# Patient Record
Sex: Male | Born: 1940 | Race: White | Hispanic: No | Marital: Single | Smoking: Former smoker
Health system: Southern US, Community
[De-identification: ages and names within clinical notes are randomized; demographics above are authoritative.]

## PROBLEM LIST (undated history)

## (undated) DIAGNOSIS — E785 Hyperlipidemia, unspecified: Secondary | ICD-10-CM

## (undated) DIAGNOSIS — M109 Gout, unspecified: Secondary | ICD-10-CM

## (undated) DIAGNOSIS — J45909 Unspecified asthma, uncomplicated: Secondary | ICD-10-CM

## (undated) DIAGNOSIS — I10 Essential (primary) hypertension: Secondary | ICD-10-CM

## (undated) DIAGNOSIS — F5104 Psychophysiologic insomnia: Secondary | ICD-10-CM

## (undated) DIAGNOSIS — T7840XA Allergy, unspecified, initial encounter: Secondary | ICD-10-CM

## (undated) DIAGNOSIS — N400 Enlarged prostate without lower urinary tract symptoms: Secondary | ICD-10-CM

## (undated) DIAGNOSIS — M1990 Unspecified osteoarthritis, unspecified site: Secondary | ICD-10-CM

## (undated) DIAGNOSIS — I4891 Unspecified atrial fibrillation: Secondary | ICD-10-CM

## (undated) DIAGNOSIS — E669 Obesity, unspecified: Secondary | ICD-10-CM

## (undated) HISTORY — DX: Unspecified asthma, uncomplicated: J45.909

## (undated) HISTORY — DX: Psychophysiologic insomnia: F51.04

## (undated) HISTORY — DX: Allergy, unspecified, initial encounter: T78.40XA

## (undated) HISTORY — DX: Obesity, unspecified: E66.9

## (undated) HISTORY — DX: Gout, unspecified: M10.9

## (undated) HISTORY — DX: Unspecified atrial fibrillation: I48.91

## (undated) HISTORY — DX: Essential (primary) hypertension: I10

## (undated) HISTORY — DX: Unspecified osteoarthritis, unspecified site: M19.90

## (undated) HISTORY — DX: Hyperlipidemia, unspecified: E78.5

## (undated) HISTORY — DX: Benign prostatic hyperplasia without lower urinary tract symptoms: N40.0

---

## 1972-07-22 DIAGNOSIS — E669 Obesity, unspecified: Secondary | ICD-10-CM

## 1972-07-22 HISTORY — DX: Obesity, unspecified: E66.9

## 1976-07-22 DIAGNOSIS — T7840XA Allergy, unspecified, initial encounter: Secondary | ICD-10-CM

## 1976-07-22 HISTORY — DX: Allergy, unspecified, initial encounter: T78.40XA

## 2013-05-24 ENCOUNTER — Ambulatory Visit: Payer: Self-pay | Admitting: Physician Assistant

## 2013-07-09 ENCOUNTER — Ambulatory Visit: Payer: Self-pay | Admitting: Family Medicine

## 2013-08-02 ENCOUNTER — Ambulatory Visit: Payer: Self-pay | Admitting: Family Medicine

## 2013-09-27 ENCOUNTER — Encounter: Payer: Self-pay | Admitting: Family Medicine

## 2013-09-27 ENCOUNTER — Ambulatory Visit (INDEPENDENT_AMBULATORY_CARE_PROVIDER_SITE_OTHER): Payer: Commercial Managed Care - HMO | Admitting: Family Medicine

## 2013-09-27 ENCOUNTER — Other Ambulatory Visit: Payer: Self-pay | Admitting: Family Medicine

## 2013-09-27 VITALS — BP 144/72 | HR 88 | Temp 98.7°F | Resp 22 | Ht 72.5 in | Wt >= 6400 oz

## 2013-09-27 DIAGNOSIS — L0882 Omphalitis not of newborn: Secondary | ICD-10-CM | POA: Insufficient documentation

## 2013-09-27 DIAGNOSIS — I872 Venous insufficiency (chronic) (peripheral): Secondary | ICD-10-CM

## 2013-09-27 DIAGNOSIS — Z23 Encounter for immunization: Secondary | ICD-10-CM

## 2013-09-27 DIAGNOSIS — N400 Enlarged prostate without lower urinary tract symptoms: Secondary | ICD-10-CM

## 2013-09-27 DIAGNOSIS — G4700 Insomnia, unspecified: Secondary | ICD-10-CM

## 2013-09-27 DIAGNOSIS — F5104 Psychophysiologic insomnia: Secondary | ICD-10-CM

## 2013-09-27 DIAGNOSIS — I10 Essential (primary) hypertension: Secondary | ICD-10-CM

## 2013-09-27 DIAGNOSIS — I4891 Unspecified atrial fibrillation: Secondary | ICD-10-CM

## 2013-09-27 DIAGNOSIS — E785 Hyperlipidemia, unspecified: Secondary | ICD-10-CM

## 2013-09-27 DIAGNOSIS — J45909 Unspecified asthma, uncomplicated: Secondary | ICD-10-CM | POA: Insufficient documentation

## 2013-09-27 DIAGNOSIS — I8310 Varicose veins of unspecified lower extremity with inflammation: Secondary | ICD-10-CM

## 2013-09-27 DIAGNOSIS — L089 Local infection of the skin and subcutaneous tissue, unspecified: Secondary | ICD-10-CM

## 2013-09-27 DIAGNOSIS — Z9181 History of falling: Secondary | ICD-10-CM

## 2013-09-27 LAB — PT WITH INR/FINGERSTICK
INR, fingerstick: 2.8 — ABNORMAL HIGH (ref 0.80–1.20)
PT, fingerstick: 33.2 seconds — ABNORMAL HIGH (ref 10.4–12.5)

## 2013-09-27 MED ORDER — SULFAMETHOXAZOLE-TMP DS 800-160 MG PO TABS: 1.0000 | ORAL_TABLET | Freq: Two times a day (BID) | ORAL | Status: DC

## 2013-09-27 NOTE — Patient Instructions (Addendum)
STOP Aspirin, ibuprofen Okay to take acetaminophen 325-500mg  twice a day for arthritis Start antibiotics for the infected belly button [facility]   Release of records - [facility] Rox[facility] Hockeyobinson[facility] [place] F/U 4 weeks

## 2013-09-28 ENCOUNTER — Encounter: Payer: Self-pay | Admitting: Family Medicine

## 2013-09-28 DIAGNOSIS — F5104 Psychophysiologic insomnia: Secondary | ICD-10-CM | POA: Insufficient documentation

## 2013-09-28 DIAGNOSIS — Z9181 History of falling: Secondary | ICD-10-CM | POA: Insufficient documentation

## 2013-09-28 LAB — CBC WITH DIFFERENTIAL/PLATELET
BASOS PCT: 0 % (ref 0–1)
Basophils Absolute: 0 10*3/uL (ref 0.0–0.1)
EOS ABS: 0.1 10*3/uL (ref 0.0–0.7)
Eosinophils Relative: 1 % (ref 0–5)
HEMATOCRIT: 43.4 % (ref 39.0–52.0)
Hemoglobin: 14.4 g/dL (ref 13.0–17.0)
LYMPHS ABS: 2.4 10*3/uL (ref 0.7–4.0)
Lymphocytes Relative: 18 % (ref 12–46)
MCH: 30.5 pg (ref 26.0–34.0)
MCHC: 33.2 g/dL (ref 30.0–36.0)
MCV: 91.9 fL (ref 78.0–100.0)
Monocytes Absolute: 0.8 10*3/uL (ref 0.1–1.0)
Monocytes Relative: 6 % (ref 3–12)
Neutro Abs: 9.8 10*3/uL — ABNORMAL HIGH (ref 1.7–7.7)
Neutrophils Relative %: 75 % (ref 43–77)
PLATELETS: 229 10*3/uL (ref 150–400)
RBC: 4.72 MIL/uL (ref 4.22–5.81)
RDW: 14.6 % (ref 11.5–15.5)
WBC: 13.1 10*3/uL — AB (ref 4.0–10.5)

## 2013-09-28 LAB — LIPID PANEL
Cholesterol: 163 mg/dL (ref 0–200)
HDL: 45 mg/dL (ref >39)
LDL CALC: 85 mg/dL (ref 0–99)
Total CHOL/HDL Ratio: 3.6 Ratio
Triglycerides: 165 mg/dL — ABNORMAL HIGH (ref <150)
VLDL: 33 mg/dL (ref 0–40)

## 2013-09-28 LAB — COMPREHENSIVE METABOLIC PANEL
ALK PHOS: 70 U/L (ref 39–117)
ALT: 47 U/L (ref 0–53)
AST: 48 U/L — AB (ref 0–37)
Albumin: 4.6 g/dL (ref 3.5–5.2)
BILIRUBIN TOTAL: 0.5 mg/dL (ref 0.2–1.2)
BUN: 29 mg/dL — ABNORMAL HIGH (ref 6–23)
CO2: 24 mEq/L (ref 19–32)
Calcium: 9.8 mg/dL (ref 8.4–10.5)
Chloride: 97 mEq/L (ref 96–112)
Creat: 1.49 mg/dL — ABNORMAL HIGH (ref 0.50–1.35)
Glucose, Bld: 123 mg/dL — ABNORMAL HIGH (ref 70–99)
Potassium: 5 mEq/L (ref 3.5–5.3)
SODIUM: 136 meq/L (ref 135–145)
Total Protein: 7.3 g/dL (ref 6.0–8.3)

## 2013-09-28 LAB — PSA, MEDICARE: PSA: 0.04 ng/mL (ref <=4.00)

## 2013-09-28 LAB — HEMOGLOBIN A1C
Hgb A1c MFr Bld: 6.8 % — ABNORMAL HIGH (ref <5.7)
MEAN PLASMA GLUCOSE: 148 mg/dL — AB (ref <117)

## 2013-09-28 NOTE — Assessment & Plan Note (Signed)
Topical cortisone as well as diuretic therapy. [place] the beach but he states that he's used these in the past and does not want to try again

## 2013-09-28 NOTE — Assessment & Plan Note (Signed)
High fall risk with his poor mobility he is also not bathing on a regular basis due to difficulties at home. I will have [facility] to assess him  and see what can be done to assist

## 2013-09-28 NOTE — Assessment & Plan Note (Signed)
History of atrial fibrillation currently in sinus rhythm on Coumadin therapy discuss with him that I would not prescribe his Coumadin if he does not come in to have his labs drawn at least every 4 weeks because of the complications that can occur been on blood thinner without monitoring. He agrees to return in 4 weeks for a routine followup we will also recheck his labs at that time. He will continue Coumadin 7.5 mg daily

## 2013-09-28 NOTE — Assessment & Plan Note (Signed)
Blood pressure looks okay we'll continue current medications fasting labs done

## 2013-09-28 NOTE — Progress Notes (Signed)
Patient ID: Leonard Montoya, male   DOB: 23-Jun-1941, 73 y.o.   MRN: [id number]   Subjective:    Patient ID: Leonard Montoya, male    DOB: [birth year], 73 y.o.   MRN: [id number]  Patient presents for New patient appointment and Immunization  patient here to establish care. His previous PCP was Dr. Luster Landsberg in [facility] he was last seen in December of 2013. He's been in [place] for the past 6 years he moved down here to be closer to his daughter. He was going back-and-forth to [place] to see his primary care doctor but has not been seen in quite some time. Of note he is on Coumadin therapy secondary to A. fib however has not had this checked in about a year. He states that he follows his Coumadin by how easy he bruises he typically takes 7.5 mg daily if he noticed that he is bruising more he will decrease to 5 mg daily.  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 He also has asthma with which he rarely uses albuterol inhaler. Per his medications he has gout as well as BPH.  Chronic insomnia and is currently on Restoril that he states he only uses this once or twice a month.  He does have an ear drop on file which he states he uses for recurrent bilateral ear infections. He was seen by ear nose and throat on one occasion  It appears that he is very weary of physicians states that they have tried to find things degenerate money and that others in the past such as his cardiologist for feeding him crap therefore he does not come on a routine basis  Patient requests Prevnar 13 as well as flu shot even though flu season is complete he still wants to vaccination  Review Of Systems:  GEN- denies fatigue, fever, weight loss,weakness, recent illness HEENT- denies eye drainage, change in vision, nasal discharge, CVS- denies chest pain, palpitations RESP- denies SOB, cough, wheeze ABD- denies N/V,  change in stools, abd pain GU- denies dysuria, hematuria, dribbling, incontinence MSK- +joint pain, muscle aches, injury Neuro- denies headache, dizziness, syncope, seizure activity       Objective:    BP 144/72  Pulse 88  Temp(Src) 98.7 F (37.1 C)  Resp 22  Ht 6' 0.5" (1.842 m)  Wt 400 lb (181.439 kg)  BMI 53.48 kg/m2 GEN- NAD, alert and oriented x3, obese, in wheelchair HEENT- PERRL, EOMI, non injected sclera, pink conjunctiva, MMM, oropharynx clear Neck- Supple,  CVS- RRR, no murmur RESP-CTAB ABD-NABS,soft,NT,ND, large pannus Skin- erythema and pus of umbilicus, NT no surrounding induration or celluliits of abdominal wall EXT- 1+ edema to shins, venous stasis dermatitis Pulses- Radial 2+, DP- decreased bilat Psych- normal affect and mood        Assessment & Plan:      Problem List Items Addressed This Visit   Venous stasis dermatitis   Relevant Medications      clobetasol ointment (TEMOVATE) 0.05 %      lidocaine (XYLOCAINE) 2 % jelly      neomycin-polymyxin-hydrocortisone (CORTISPORIN) 3.5-10000-1 otic suspension   Other and unspecified hyperlipidemia   Relevant Medications      diltiazem (CARDIZEM CD) 180 MG 24 hr capsule      doxazosin (CARDURA) 4 MG tablet      simvastatin (ZOCOR) 40 MG tablet      spironolactone (ALDACTONE) 100 MG tablet  warfarin (COUMADIN) 5 MG tablet      torsemide (DEMADEX) 20 MG tablet   Other Relevant Orders      Lipid panel (Completed)   Omphalitis in adult   Relevant Orders      Wound culture   Morbid obesity   Essential hypertension, benign - Primary   Relevant Orders      CBC with Differential (Completed)      Comprehensive metabolic panel (Completed)   Atrial fibrillation   Relevant Orders      PT with INR/Fingerstick (Completed)   Asthma, chronic   Relevant Medications      albuterol (PROVENTIL HFA;VENTOLIN HFA) 108 (90 BASE) MCG/ACT inhaler    Other Visit Diagnoses   BPH (benign prostatic hypertrophy)         Relevant Medications       tamsulosin (FLOMAX) 0.4 MG CAPS capsule    Other Relevant Orders       PSA, Medicare (Completed)    Need for prophylactic vaccination and inoculation against influenza        Relevant Orders       Flu Vaccine QUAD 36+ mos PF IM (Fluarix) (Completed)    Need for prophylactic vaccination against Streptococcus pneumoniae (pneumococcus)        Relevant Orders       Pneumococcal conjugate vaccine 13-valent (Completed)       Note: This dictation was prepared with Dragon dictation along with smaller phrase technology. Any transcriptional errors that result from this process are unintentional.

## 2013-09-28 NOTE — Assessment & Plan Note (Signed)
Will start Bactrim twice a day for the infection wound culture was sent

## 2013-09-29 ENCOUNTER — Other Ambulatory Visit: Payer: Self-pay | Admitting: *Deleted

## 2013-09-29 DIAGNOSIS — I10 Essential (primary) hypertension: Secondary | ICD-10-CM

## 2013-09-29 DIAGNOSIS — I4891 Unspecified atrial fibrillation: Secondary | ICD-10-CM

## 2013-09-29 DIAGNOSIS — J45909 Unspecified asthma, uncomplicated: Secondary | ICD-10-CM

## 2013-09-30 LAB — WOUND CULTURE: GRAM STAIN: NONE SEEN

## 2013-10-01 ENCOUNTER — Other Ambulatory Visit: Payer: Self-pay | Admitting: *Deleted

## 2013-10-01 ENCOUNTER — Telehealth: Payer: Self-pay | Admitting: *Deleted

## 2013-10-01 ENCOUNTER — Encounter: Payer: Self-pay | Admitting: *Deleted

## 2013-10-01 MED ORDER — BLOOD GLUCOSE MONITOR KIT: PACK | Status: DC

## 2013-10-01 NOTE — Telephone Encounter (Signed)
Received call from [facility], nurse care manager with [facility].   Reported that visit placed with patient to attempt start of care. States that patient refused skilled nursing for falls, medication management and home safety.   States that patient did agree to Child psychotherapistsocial worker for transportation issues.   MD to be made aware.

## 2013-10-01 NOTE — Progress Notes (Unsigned)
Per orders noted on labs, glucometer, lancets and test strips sent to pharmacy.

## 2013-10-01 NOTE — Telephone Encounter (Signed)
Per order noted on labs, glucose monitor kit ordered.

## 2013-10-01 NOTE — Telephone Encounter (Signed)
Has been out of [place] since Jan 2015. States that [facility] been [facility], but patient has not notified MD.   PA submitted.

## 2013-10-04 NOTE — Telephone Encounter (Signed)
noted 

## 2013-10-04 NOTE — Telephone Encounter (Signed)
PA approved through 10/03/2015.

## 2013-10-14 ENCOUNTER — Telehealth: Payer: Self-pay | Admitting: *Deleted

## 2013-10-14 NOTE — Telephone Encounter (Signed)
Leonard Inaavina Green, RN care manager returned call.   States that patient is adamant about refusing SN and SW.   Advised that patient does want SW per earlier conversation, and to attempt to contact patient again.   SW will contact and F/U with MD.

## 2013-10-14 NOTE — Telephone Encounter (Signed)
Received call from patient. Reported that [facility], but he only heard from SW last night (10/14/2013). States that SW had incorrect information about him and the incorrect MD on file. Requested to have referral re-sent with corrected information.  [facility] inquire.

## 2013-10-15 NOTE — Telephone Encounter (Signed)
Call placed to patient daughter. Left VM.

## 2013-10-15 NOTE — Telephone Encounter (Signed)
Call pt Daughter to see if she can check in on him, bring in him if needed for appt

## 2013-10-15 NOTE — Telephone Encounter (Signed)
Patient daughter contacted and states that she will F/U with patient and will contact MD office after.

## 2013-10-18 ENCOUNTER — Encounter: Payer: Self-pay | Admitting: Family Medicine

## 2013-10-18 DIAGNOSIS — R2681 Unsteadiness on feet: Secondary | ICD-10-CM | POA: Insufficient documentation

## 2013-10-18 NOTE — Telephone Encounter (Signed)
Call placed to patient daughter, Dois DavenportSandra to F[facility]U. [facility].

## 2013-10-20 NOTE — Telephone Encounter (Signed)
Call placed to patient daughter, Dois DavenportSandra.   Reports that patient is doing ok; he has not declined since last office visit.   States that she believes that patient is in denial as to how bad his current situation is. She is attempting to talk him into accepting assistance from [facility].

## 2013-10-26 ENCOUNTER — Telehealth: Payer: Self-pay | Admitting: *Deleted

## 2013-10-26 ENCOUNTER — Ambulatory Visit: Payer: Commercial Managed Care - HMO | Admitting: Family Medicine

## 2013-10-26 NOTE — Telephone Encounter (Signed)
Leonard Montoya a home health nurse is calling to get some clarification on a referral that was referred to her, she stated that she had contacted patient and that he is still refusing care at this time d/t financial issues, [name] feels that he may benefit form using CAPS, she is going to talk to her Home Healt Nurse in regarding this [name] and will try to have someone call us back once they can talk to pt.

## 2013-11-01 ENCOUNTER — Ambulatory Visit: Payer: Self-pay | Admitting: Family Medicine

## 2013-12-01 ENCOUNTER — Ambulatory Visit: Payer: Commercial Managed Care - HMO | Admitting: Family Medicine

## 2014-01-24 ENCOUNTER — Ambulatory Visit: Payer: Commercial Managed Care - HMO | Admitting: Family Medicine

## 2014-06-14 ENCOUNTER — Ambulatory Visit: Payer: Commercial Managed Care - HMO | Admitting: Physician Assistant

## 2014-06-20 ENCOUNTER — Ambulatory Visit: Payer: Commercial Managed Care - HMO | Admitting: Family Medicine

## 2014-07-08 ENCOUNTER — Ambulatory Visit: Payer: Commercial Managed Care - HMO | Admitting: Family Medicine

## 2014-07-13 ENCOUNTER — Ambulatory Visit: Payer: Commercial Managed Care - HMO | Admitting: Family Medicine

## 2015-06-07 ENCOUNTER — Telehealth: Payer: Self-pay | Admitting: Physician Assistant

## 2015-06-07 NOTE — Telephone Encounter (Signed)
Daughter states has had some agencies in, but due to his size they refuse to take his case.  [facility] and gave daughter as contact number.

## 2015-06-07 NOTE — Telephone Encounter (Signed)
Patient's daughter, Dois DavenportSandra called to see if we could fax a prescription for a Michiel SitesHoyer lift to [facility]. She states that she is trying her best to allow him to stay in his home, but that he is severely over weight and she can not lift him. She also wanted to know if we can approve for him to have a wheel chair to help them move him around. She would like to speak with someone about possibly getting home care in because it is becoming too much for her to handle.  [facility] fax: [phone] Dois DavenportSandra: [phone]

## 2015-06-07 NOTE — Telephone Encounter (Signed)
Last year attempt was made to set him up for home health and will try to healthcare network and social work services he deferred fuse all of these. He has to be seen for me to give any orders has not been seen in over a year. If he is that severe at home I highly recommend calling the ambulance and sending him to the hospital against his will so that he can be evaluated and treated for any medical problems and services can be started.

## 2015-06-07 NOTE — Telephone Encounter (Signed)
Pt has not been seen in the office since March 2015.  Daughter says he weighs > 400 lbs.  She can not handle him.  He is falling.  Has to get EMS to house to get up, then he refuses to go to hospital.  Having respiratory distress even when sitting up.  (again refuses hospital)  Pt MUST be see by a health care provider!!!!   We can not order any supplies thru Medicare without face to face visit.  [facility] to see if they can assist him with transportation, office visit, help at home.  We have not been refilling meds???

## 2015-06-09 ENCOUNTER — Other Ambulatory Visit: Payer: Self-pay | Admitting: *Deleted

## 2015-06-09 NOTE — Patient Outreach (Signed)
[facility]) Care Management  06/09/2015  Fran LowesJoel A Spikes 12/30/1940 [id number]  Subjective:  Telephone call to patient, no answer, HIPAA compliant voice mail message left and RNCM request call back.  Assessment:  MD referral for HTN, patient can not get out of house, refusing hospitalization and patient's daughter needs help.    Plan: RNCM will contact patient within 3 business days if no return phone call.

## 2015-06-13 ENCOUNTER — Other Ambulatory Visit: Payer: Self-pay

## 2015-06-13 ENCOUNTER — Ambulatory Visit: Payer: Self-pay | Admitting: *Deleted

## 2015-06-13 NOTE — Patient Outreach (Signed)
[facility]) Care Management  06/13/2015  Leonard LowesJoel A Montoya 08/25/1940 [id number]   Telephone Screen  Referral Date: 06/07/15 Referral Source: MD office(Dr. Milinda AntisKawanta [place]) Referral Reason: A-fib, morbid obesity, patient can not get out of his house, refuses hospital, dtr needs help  Outreach attempt #2 to patient. No answer. RN CM left HIPAA compliant voicemail message along with contact info.  Plan: RN CM will attempt outreach call to patient again within a week.  Antionette Fairyoshanda Saadia Dewitt, RN,BSN,CCM [facility] Care Management Telephonic Care Management Coordinator Direct Phone: [phone] Toll Free: [phone] Fax: [phone]

## 2015-06-16 ENCOUNTER — Other Ambulatory Visit: Payer: Self-pay

## 2015-06-16 NOTE — Patient Outreach (Signed)
[facility]) Care Management  06/16/2015  Leonard LowesJoel A Montoya 01/14/1941 [id number]   Telephone Screen  Referral Date: 06/07/15 Referral Source: MD office(Dr. Milinda AntisKawanta [place]) Referral Reason: A-fib, morbid obesity, patient can not get out of his house, refuses hospital, dtr needs help  Outreach attempt # 3 to patient. No answer. RN CM left HIPAA compliant voicemail message along with contact info.   Plan: RN CM will send unable to reach letter to patient and close case if no response from patient within 10 days.  Antionette Fairyoshanda Jossue Rubenstein, RN,BSN,CCM [facility] Care Management Telephonic Care Management Coordinator Direct Phone: [phone] Toll Free: [phone] Fax: [phone]

## 2015-06-30 ENCOUNTER — Other Ambulatory Visit: Payer: Self-pay

## 2015-06-30 NOTE — Patient Outreach (Signed)
[facility]) Care Management  06/30/2015  Leonard LowesJoel A Montoya 01/04/1941 [id number]   Telephone Screen  Referral Date: 06/07/15 Referral Source: MD office(Dr. Milinda AntisKawanta [place]) Referral Reason: A-fib, morbid obesity, patient can not get out of his house, refuses hospital, dtr needs help   RN CM has made multiple attempts to reach patient. No response from unable to reach letter. RN CM closing case out per policy.   Plan: RN CM will notify [facility] administrative assistant of case closure due to unable to reach. RN CM will notify MD via case closure letter.  Leonard Fairyoshanda Nashly Olsson, RN,BSN,CCM [facility] Care Management Telephonic Care Management Coordinator Direct Phone: [phone] Toll Free: [phone] Fax: [phone]

## 2015-07-06 ENCOUNTER — Other Ambulatory Visit: Payer: Self-pay

## 2015-07-06 NOTE — Patient Outreach (Signed)
[facility]) Care Management  07/06/2015  Leonard LowesJoel A Montoya 08/06/1940 [id number]   Telephone Screen  Referral Date: 07/06/15 Referral Source: MD office(Dr. Jeanice Limurham) Referral Reason:HTN, "patient can not get out of his house, refuses hospital, dtr needs help"   Outreach attempt #1 to patient. No answer. RN CM left HIPAA compliant voicemail message along with contact info. Contacted dtr-Sandra via her cell phone. She is at work and not near patient. She states patient does not hear his phone most of the time and doesn't answer. She advised best method to contact him was via e-mail. Informed dtr that we do not do this. Advised dtr that due to HIPAA regulations patient consent needed in order to further discuss his medical care with her. Provided dtr with RN CM contact info. She will contact patient and advise him to call RN CM to give verbal consent.   Plan: RN CM will attempt outreach call to patient/dtr again if no response from patient.  Antionette Fairyoshanda Joseh Sjogren, RN,BSN,CCM [facility] Care Management Telephonic Care Management Coordinator Direct Phone: [phone] Toll Free: [phone] Fax: [phone]

## 2015-07-10 ENCOUNTER — Other Ambulatory Visit: Payer: Self-pay

## 2015-07-10 NOTE — Patient Outreach (Signed)
[facility]) Care Management  07/10/2015  Leonard LowesJoel A Montoya 02/09/1941 [id number]  Referral Date: 06/07/15 Referral Source: MD office(Dr. Milinda AntisKawanta [place]) Referral Reason: A-fib, morbid obesity, patient can not get out of his house, refuses hospital, dtr needs help   Outreach attempt # 2. No answer at patient's phone number. RN CM contacted dtr-Sandra. She states she has spoken with patient and gave him RN CM's contact info and advised him to call. Informed dtr that this RN CM has had no calls from patient. Dtr states she will contact patient again via e-mail and text to provide RN CM contact info and have patient call.   Plan: RN CM will make outreach attempt to patient within a week in no response from patient.  Antionette Fairyoshanda [name], RN,BSN,CCM [facility] Care Management Telephonic Care Management Coordinator Direct Phone: [phone] Toll Free: [phone] Fax: [phone]

## 2015-07-11 ENCOUNTER — Other Ambulatory Visit: Payer: Self-pay

## 2015-07-11 DIAGNOSIS — I10 Essential (primary) hypertension: Secondary | ICD-10-CM

## 2015-07-11 NOTE — Patient Outreach (Signed)
[facility]) Care Management  07/11/2015  Fran LowesJoel A Leaf 09/07/1940 [id number]   Referral Date: 06/07/15 Referral Source: MD office(Dr. Milinda AntisKawanta [place]) Referral Reason: A-fib, morbid obesity, patient can not get out of his house, refuses hospital, dtr needs help   Incoming call from patient. Screening completed.  Social: Patient resides in his home alone. He states he has not left his home in over a year. He reports being confined to home due to his weight of 400lbs. Patient states that he is physically unable to get out of the home as he does not have a ramp. He reports about 2-3 falls in the home within the last year. He had to call 911 and they sent several fire fighters to get him up off the floor and back to bed. He reports no injuries sustained. Patient states he needs help performing personal care and does not have any one to assist. He reported that he is unable to move around well at all.    Conditions: Patient has h/o HTN, A-fib, asthma, gout and BPH.   Medications: He reports that he is supposed to be on medications. However, he has not taken any prescription meds in over a year. He states he ran out of meds over a year ago and because he had not been seen by PCP in such a long time he was unable to get refills and has since gone without meds.  Consent: Patient was very skeptical and has history of known distrust for medical personnel. However, after much discussion with him he did agree to services. Patient gave [facility]. Discussed issue of consulting and discussing case with his dtr-sandra. He reported that he was capable of managing his care and did not want her to interfere as "she tends to do what she wants to do." Patient voiced that he wanted to "stay in control." Patient agreeable to phone calls by RN staff but requested that home visit not be until "after the holidays."  Plan: RN CM will notify [facility] administrative assistant that  patient agreed to services and case opened. RN CM will send [facility] community referral for further in home evaluation and assessment. RN CM provided patient with [facility] contact info.  Antionette Fairyoshanda Millee Denise, RN,BSN,CCM [facility] Care Management Telephonic Care Management Coordinator Direct Phone: [phone] Toll Free: [phone] Fax: [phone]

## 2015-07-19 ENCOUNTER — Other Ambulatory Visit: Payer: Self-pay | Admitting: *Deleted

## 2015-07-19 NOTE — Patient Outreach (Signed)
Attempt made to contact pt, schedule home visit.   Used mobile number found on pt's profile in Epic, voice message stated daughter's name.  HIPPA compliant voice message left with contact number.     Addendum: Spoke with Darl Householderashanda [facility] telephonic care management coordinator) who did a recent telephone assessment on pt, reports pt is difficult to get a hold of, received a return call from daughter who said pt is better with  E mail (daughter emailed pt, contacted telephonic care management coordinator).    If no response from daughter, will try home number provided on profile in Epic.       Shayne Alkenose M.   Johnmatthew Solorio RN CCM [facility] Care Management  [phone]

## 2015-08-01 ENCOUNTER — Other Ambulatory Visit: Payer: Self-pay | Admitting: *Deleted

## 2015-08-01 VITALS — BP 130/90 | HR 63 | Resp 20

## 2015-08-01 DIAGNOSIS — I10 Essential (primary) hypertension: Secondary | ICD-10-CM

## 2015-08-01 NOTE — Patient Outreach (Signed)
Leonard Montoya) Care Management   08/01/2015  Leonard Montoya [birth year] [id number]  Leonard Montoya is an 75 y.o. male  Subjective:   Pt reports  before seeing his Primary Care  MD, needs someone to give him a bath and  see  a dentist.  Pt reports he cannot get out of his w/c except for  transfers to his bed, needs a dentist who can do the work in his w/c.   Pt  reports has not been able to get out of his house (>year), a month ago had a ramp put in but cannot get in his car, needs help with transportation.   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   Pt reports he stopped taking his medications when prescriptions ran out.    Objective:   Filed Vitals:   08/01/15 1433  BP: 130/90  Pulse: 63  Resp: 20    ROS  Physical Exam  Constitutional: He is oriented to person, place, and time. He appears well-developed and well-nourished.  Cardiovascular: Normal rate.   Irregular   Respiratory: Effort normal and breath sounds normal.  GI: Soft. Bowel sounds are normal. He exhibits distension.  Musculoskeletal: He exhibits edema.  W/c bound.   +1 edema to top of both feet   Neurological: He is alert and oriented to person, place, and time.  Skin: Skin is warm and dry.  Psychiatric: He has a normal mood and affect. His behavior is normal. Judgment and thought content normal.    Current Medications:  Pt not taking any medications except OTC pain medication as needed.  Current Outpatient Prescriptions  Medication Sig Dispense Refill  . albuterol (PROVENTIL HFA;VENTOLIN HFA) 108 (90 BASE) MCG/ACT inhaler Inhale 2 puffs into the lungs every 6 (six) hours as needed for wheezing or shortness of breath.    Marland Kitchen ibuprofen (ADVIL,MOTRIN) 800 MG tablet Take 800 mg by mouth 2 (two) times daily.    Marland Kitchen allopurinol (ZYLOPRIM) 300 MG tablet Take 300 mg by mouth daily. Reported on 08/01/2015    . Blood Glucose Monitoring Suppl (BLOOD GLUCOSE MONITOR  KIT) KIT Blood Glucose Monitor- check FSBS 1x daily. Lancets &Test strips #50. DX:250.00 (Patient not taking: Reported on 08/01/2015) 50 each 11  . clobetasol ointment (TEMOVATE) 6.37 % Apply 1 application topically 2 (two) times daily. Reported on 08/01/2015    . diltiazem (CARDIZEM CD) 180 MG 24 hr capsule Take 180 mg by mouth daily. Reported on 08/01/2015    . doxazosin (CARDURA) 4 MG tablet Take 4 mg by mouth 2 (two) times daily. Reported on 08/01/2015    . lidocaine (XYLOCAINE) 2 % jelly 1 application as needed. Reported on 08/01/2015    . neomycin-polymyxin-hydrocortisone (CORTISPORIN) 3.5-10000-1 otic suspension Place 3 drops into both ears 2 (two) times daily. Reported on 08/01/2015    . simvastatin (ZOCOR) 40 MG tablet Take 40 mg by mouth daily. Reported on 08/01/2015    . spironolactone (ALDACTONE) 100 MG tablet Take 100 mg by mouth 2 (two) times daily. Reported on 08/01/2015    . sulfamethoxazole-trimethoprim (BACTRIM DS) 800-160 MG per tablet Take 1 tablet by mouth 2 (two) times daily. (Patient not taking: Reported on 08/01/2015) 20 tablet 0  . tamsulosin (FLOMAX) 0.4 MG CAPS capsule Take 0.4 mg by mouth daily. Reported on 08/01/2015    . temazepam (RESTORIL) 15 MG capsule Take 15 mg by mouth at bedtime. Reported on 08/01/2015    .  torsemide (DEMADEX) 20 MG tablet Take 20 mg by mouth 2 (two) times daily. Reported on 08/01/2015    . warfarin (COUMADIN) 5 MG tablet Take 7.5 mg by mouth daily. Reported on 08/01/2015     No current facility-administered medications for this visit.    Functional Status:   In your present state of health, do you have any difficulty performing the following activities: 08/01/2015  Hearing? Y  Vision? Y  Difficulty concentrating or making decisions? N  Walking or climbing stairs? Y  Dressing or bathing? Y  Doing errands, shopping? Y  Preparing Food and eating ? N  Using the Toilet? N  In the past six months, have you accidently leaked urine? N  Do you have problems  with loss of bowel control? N  Managing your Medications? N  Managing your Finances? N  Housekeeping or managing your Housekeeping? Y    Fall/Depression Screening:    PHQ 2/9 Scores 08/01/2015 09/27/2013  PHQ - 2 Score 0 1  Exception Documentation Patient refusal -    Assessment:  Pt lives alone, w/c bound, reports difficulties with ADLs due to weight/impaired mobility.  +1 edema to top of both feet (scaley areas noted on bilateral lower legs).  Currently not taking any medications with exception of OTC pain medications as needed (ran out, not seen MD in over a year).     Plan:   As discussed with pt, RN CM to call MD's office and schedule office visit             Referral to be sent to [facility] CSW to assist pt with transportation needs/resources for personal care              Services.              RN CM to provide community nurse case management services, next home visit 2/17.               Plan to send involvement letter and route encounter  to Dr. Buelah Manis via in basket in [place].    [facility] CM Care Plan Problem One        Most Recent Value   Care Plan Problem One  Impaired health maintenance- pt has not f/u with MD in almost 10 months    Role Documenting the Problem One  Care Management [place] for Problem One  Active   [facility] Long Term Goal (31-90 days)  Pt would start back managing chronic health issues (HTN) within next 45 days    [facility] Long Term Goal Start Date  08/01/15   Interventions for Problem One Long Term Goal  Discussed with pt importance of f/u with MD more often to have medications refiilled, importance of medication compliance    [facility] CM Short Term Goal #1 (0-30 days)  Pt would start back on his medications after f/u wiith Primary Care MD within next 14 days    [facility] CM Short Term Goal #1 Start Date  08/01/15   Interventions for Short Term Goal #1  Discussed with pt importance of f/u with Primary Care MD to have medications refilled, start back being compliant.     [facility] CM  Short Term Goal #2 (0-30 days)  Pt would f/u with Primary Care MD within 14 days    [facility] CM Short Term Goal #2 Start Date  08/01/15   Interventions for Short Term Goal #2  Discussed with pt importance of f/u with Primary Care MD on  a regular basis,  per pt's permisssion, RN CM to contact MD's nurse- schedule pt's f/u appointment .         Leonard Montoya.   [place] Care Management  [phone]

## 2015-08-02 ENCOUNTER — Encounter: Payer: Self-pay | Admitting: *Deleted

## 2015-08-03 ENCOUNTER — Other Ambulatory Visit: Payer: Self-pay | Admitting: Licensed Clinical Social Worker

## 2015-08-03 ENCOUNTER — Encounter: Payer: Self-pay | Admitting: *Deleted

## 2015-08-03 ENCOUNTER — Other Ambulatory Visit: Payer: Self-pay | Admitting: *Deleted

## 2015-08-03 NOTE — Patient Outreach (Signed)
Call[facility] pt [facility]  That he wished to cancel upcoming MD appointment on 1/17, more of a priority for personal care services/follow up at the dentist.   Discussed with pt transportation can be arranged for scheduled MD appointment to which pt reports first needs to address dentist, not willing to f/u with MD yet.  RN CM discussed with pt [facility]'s role is to assist with his medical needs- f/u  with Primary Care MD (not seen in over a year) and with him not willing to do so at this time, informed pt will be discharging from community nurse case management services.       Plan to inform Alona BeneJoyce CSW of plan to discharge.   Plan to send via in basket in Epic case closure letter to Dr. Jeanice Limurham.

## 2015-08-03 NOTE — Patient Outreach (Addendum)
[facility]) Care Management  08/03/2015  Leonard Montoya 03/13/1941 [id number]   Assessment-[facility] in order to provide social work assistance. CSW attempted two outreaches on 08/03/15 and was able to reach patient successfully on second outreach. Patient answered and provided HIPPA verifications. CSW introduced self, reason for call and of [facility] social work services. Patient hesitant to accept social work assistance but is willing to provide information and learn about community resources. CSW informed patient that RNCM schedule for MD appointment for 07/30/15 and patient reports that he is not willing to attend office visit at this time stating "I'm not ready for that."  Patient reports that he has not been out of the house for over one year and is unable to use his car to drive as he is unable to get out of wheelchair. Patient has not been to seen a medical provider in over a year and has ran out of his medications. CSW educated patient on possible transportation services that would be able to have him board the vehicle by using a wheelchair lift. Patient reports that at this time he is not interested in attending a physician appointment. Patient reports "My top priorities right now are going to a dentist, getting someone to give me a foot massage and helping me with my bathes. I will worry about going to the doctor after I'm doing with all of that." Patient went on to say "I will never be someone to go a doctor's appointment every month. I might go once every three of four months but that's it. I went three years without anyone caring how I was doing and I'm doing just fine." Patient reports that his electric wheelchair weights 290 pound and that he weighs 400 pounds himself. Patient reports that he receives grocery shop delivery through [employer] every 3 years for $25.00. Patient denies depressive symptoms and is not willing to  accept mental health resources. Patient lives alone at his residence which has a ramp. Patient does not wish to discuss his support network and reports that he does not wish for CSW to discuss his care with his daughter. Patient states that his 77 year old grandson is currently visiting him and that he comes once per year. Patient reports his monthly income as $2,380 and patient is aware that he does not qualify for Medicaid and therefore would not qualify for the CAPPS program or Personal Care Services program. Patient would not directly answer the question in regards to whether he would be willing to pay for personal care services out of pocket. CSW educated patient on available services. Patient reports that he rather focus on gaining dental work. Patient reported that he needed to get off the phone at this time.   CSW contacted [employer] and it was confirmed that they would be able to accommodate for patient's needs. CSW left HIPPA compliant voice message for both [employer] and [employer].  CSW contacted RNCM and updated her on patient's wishes to cancel upcoming MD appointment. RNCM has contacted our clinical supervisor. RNCM contacted patient back to see if he would be willing to attend MD appointment if it was scheduled a few weeks further out and patient stated that he would rather focus on gaining dental work at this time. Patient understands that both RNCM and CSW will complete case closure at this time.  Plan-CSW will complete social work discharge and inform Care Management Assistant of case closure.  Eula Fried, BSW, MSW, [email or web address] Phone: [phone] Fax: [phone]

## 2015-08-08 ENCOUNTER — Ambulatory Visit: Payer: Self-pay | Admitting: Family Medicine

## 2015-09-11 ENCOUNTER — Other Ambulatory Visit: Payer: Self-pay | Admitting: *Deleted

## 2015-09-11 DIAGNOSIS — R0602 Shortness of breath: Secondary | ICD-10-CM | POA: Diagnosis not present

## 2015-09-11 NOTE — Patient Outreach (Signed)
Received a call from pt, HIPPA verified.   Pt reports he needed to find how to get oxygen in the bottles until he gets new concentrator delivered.   RN CM suggested to call his O2 supplier which he said it was so long ago does not know who it is.   Also suggested pt f/u with MD to which pt said forget it.   Note-  Previous call with pt on 1/12- cancelled appointment with New  Primary Care MD (not seen in over a year by previous MD), not willing to f/u with MD at this time, making pt  not eligible for [facility] program.     Shayne Alken.   Pierzchala RN CCM [facility] Care Management  [phone]

## 2015-09-12 ENCOUNTER — Emergency Department (HOSPITAL_COMMUNITY): Payer: Commercial Managed Care - HMO

## 2015-09-12 ENCOUNTER — Encounter (HOSPITAL_COMMUNITY): Payer: Self-pay

## 2015-09-12 ENCOUNTER — Emergency Department (HOSPITAL_COMMUNITY)
Admission: EM | Admit: 2015-09-12 | Discharge: 2015-09-12 | Disposition: A | Discharge status: Home / Self Care | Payer: Commercial Managed Care - HMO | Attending: Emergency Medicine | Admitting: Emergency Medicine

## 2015-09-12 DIAGNOSIS — Z87891 Personal history of nicotine dependence: Secondary | ICD-10-CM | POA: Insufficient documentation

## 2015-09-12 DIAGNOSIS — R0602 Shortness of breath: Secondary | ICD-10-CM | POA: Diagnosis not present

## 2015-09-12 DIAGNOSIS — E785 Hyperlipidemia, unspecified: Secondary | ICD-10-CM | POA: Insufficient documentation

## 2015-09-12 DIAGNOSIS — Z87438 Personal history of other diseases of male genital organs: Secondary | ICD-10-CM | POA: Insufficient documentation

## 2015-09-12 DIAGNOSIS — E669 Obesity, unspecified: Secondary | ICD-10-CM | POA: Insufficient documentation

## 2015-09-12 DIAGNOSIS — I4819 Other persistent atrial fibrillation: Secondary | ICD-10-CM

## 2015-09-12 DIAGNOSIS — I481 Persistent atrial fibrillation: Secondary | ICD-10-CM | POA: Diagnosis not present

## 2015-09-12 DIAGNOSIS — M109 Gout, unspecified: Secondary | ICD-10-CM | POA: Diagnosis not present

## 2015-09-12 DIAGNOSIS — Z792 Long term (current) use of antibiotics: Secondary | ICD-10-CM | POA: Insufficient documentation

## 2015-09-12 DIAGNOSIS — Z7952 Long term (current) use of systemic steroids: Secondary | ICD-10-CM | POA: Insufficient documentation

## 2015-09-12 DIAGNOSIS — R05 Cough: Secondary | ICD-10-CM | POA: Diagnosis not present

## 2015-09-12 DIAGNOSIS — Z79899 Other long term (current) drug therapy: Secondary | ICD-10-CM | POA: Diagnosis not present

## 2015-09-12 DIAGNOSIS — J45901 Unspecified asthma with (acute) exacerbation: Secondary | ICD-10-CM | POA: Diagnosis not present

## 2015-09-12 DIAGNOSIS — I10 Essential (primary) hypertension: Secondary | ICD-10-CM | POA: Diagnosis not present

## 2015-09-12 DIAGNOSIS — Z8669 Personal history of other diseases of the nervous system and sense organs: Secondary | ICD-10-CM | POA: Diagnosis not present

## 2015-09-12 DIAGNOSIS — M1990 Unspecified osteoarthritis, unspecified site: Secondary | ICD-10-CM | POA: Insufficient documentation

## 2015-09-12 DIAGNOSIS — Z7901 Long term (current) use of anticoagulants: Secondary | ICD-10-CM | POA: Diagnosis not present

## 2015-09-12 DIAGNOSIS — I517 Cardiomegaly: Secondary | ICD-10-CM | POA: Diagnosis not present

## 2015-09-12 LAB — CBC WITH DIFFERENTIAL/PLATELET
Basophils Absolute: 0.1 10*3/uL (ref 0.0–0.1)
Basophils Relative: 0 %
Eosinophils Absolute: 0.1 10*3/uL (ref 0.0–0.7)
Eosinophils Relative: 1 %
HEMATOCRIT: 46.8 % (ref 39.0–52.0)
HEMOGLOBIN: 14.3 g/dL (ref 13.0–17.0)
LYMPHS ABS: 1.4 10*3/uL (ref 0.7–4.0)
Lymphocytes Relative: 11 %
MCH: 29.6 pg (ref 26.0–34.0)
MCHC: 30.6 g/dL (ref 30.0–36.0)
MCV: 96.9 fL (ref 78.0–100.0)
MONO ABS: 0.8 10*3/uL (ref 0.1–1.0)
MONOS PCT: 7 %
NEUTROS ABS: 10.3 10*3/uL — AB (ref 1.7–7.7)
NEUTROS PCT: 81 %
Platelets: 203 10*3/uL (ref 150–400)
RBC: 4.83 MIL/uL (ref 4.22–5.81)
RDW: 13.8 % (ref 11.5–15.5)
WBC: 12.7 10*3/uL — ABNORMAL HIGH (ref 4.0–10.5)

## 2015-09-12 LAB — BASIC METABOLIC PANEL
Anion gap: 12 (ref 5–15)
BUN: 14 mg/dL (ref 6–20)
CHLORIDE: 107 mmol/L (ref 101–111)
CO2: 22 mmol/L (ref 22–32)
CREATININE: 0.87 mg/dL (ref 0.61–1.24)
Calcium: 9.4 mg/dL (ref 8.9–10.3)
GFR calc Af Amer: >60 mL/min (ref >60)
GFR calc non Af Amer: >60 mL/min (ref >60)
GLUCOSE: 116 mg/dL — AB (ref 65–99)
Potassium: 3.8 mmol/L (ref 3.5–5.1)
Sodium: 141 mmol/L (ref 135–145)

## 2015-09-12 LAB — I-STAT TROPONIN, ED: TROPONIN I, POC: 0.04 ng/mL (ref 0.00–0.08)

## 2015-09-12 LAB — BRAIN NATRIURETIC PEPTIDE: B Natriuretic Peptide: 137.5 pg/mL — ABNORMAL HIGH (ref 0.0–100.0)

## 2015-09-12 MED ORDER — SIMVASTATIN 40 MG PO TABS: 40.0000 mg | ORAL_TABLET | Freq: Every day | ORAL | Status: DC

## 2015-09-12 MED ORDER — SPIRONOLACTONE 100 MG PO TABS: 100.0000 mg | ORAL_TABLET | Freq: Two times a day (BID) | ORAL | Status: DC

## 2015-09-12 MED ORDER — DILTIAZEM HCL 100 MG IV SOLR
5.0000 mg/h | INTRAVENOUS | Status: DC
  Administered 2015-09-12: 5 mg/h via INTRAVENOUS
  Filled 2015-09-12: qty 100

## 2015-09-12 MED ORDER — ALBUTEROL SULFATE HFA 108 (90 BASE) MCG/ACT IN AERS: 1.0000 | INHALATION_SPRAY | Freq: Four times a day (QID) | RESPIRATORY_TRACT | Status: DC | PRN

## 2015-09-12 MED ORDER — DILTIAZEM HCL ER COATED BEADS 240 MG PO CP24
240.0000 mg | ORAL_CAPSULE | Freq: Once | ORAL | Status: AC
  Administered 2015-09-12: 240 mg via ORAL
  Filled 2015-09-12: qty 1

## 2015-09-12 MED ORDER — DILTIAZEM HCL 100 MG IV SOLR: 5.0000 mg/h | INTRAVENOUS | Status: DC

## 2015-09-12 MED ORDER — DILTIAZEM LOAD VIA INFUSION
10.0000 mg | Freq: Once | INTRAVENOUS | Status: DC
  Filled 2015-09-12: qty 10

## 2015-09-12 MED ORDER — TORSEMIDE 20 MG PO TABS: 20.0000 mg | ORAL_TABLET | Freq: Two times a day (BID) | ORAL | Status: DC

## 2015-09-12 MED ORDER — TAMSULOSIN HCL 0.4 MG PO CAPS: 0.4000 mg | ORAL_CAPSULE | Freq: Every day | ORAL | Status: DC

## 2015-09-12 MED ORDER — DILTIAZEM HCL ER COATED BEADS 180 MG PO CP24: 180.0000 mg | ORAL_CAPSULE | Freq: Every day | ORAL | Status: DC

## 2015-09-12 MED ORDER — DOXAZOSIN MESYLATE 4 MG PO TABS: 4.0000 mg | ORAL_TABLET | Freq: Every day | ORAL | Status: DC

## 2015-09-12 MED ORDER — WARFARIN SODIUM 5 MG PO TABS: 5.0000 mg | ORAL_TABLET | Freq: Every day | ORAL | Status: DC

## 2015-09-12 MED ORDER — DILTIAZEM LOAD VIA INFUSION
25.0000 mg | Freq: Once | INTRAVENOUS | Status: AC
  Administered 2015-09-12: 25 mg via INTRAVENOUS
  Filled 2015-09-12: qty 25

## 2015-09-12 NOTE — Progress Notes (Signed)
[facility] consulted by EDP for possible home health services.  Patient is currently without a pcp.  Patient with [facility] insurance.  Per chart review, patient is not eligible for [facility] due to not having a pcp.  Per EDP, patient wears oxygen at home but has not had oxygen at home lately.  Per chart review, patient does not  remember the name of his oxygen supplier.  Per EDP, patient has an Passenger transport manager and own hoyer lift at home.  Disposition of patient pending.  EDCM called [facility] CM Tobi Bastos to see patient in the ED.  EDCM asked [facility] CM to see if patient would be agreeable to have physician from [facility] come to see patient at home.  [facility] discussed patient with EDP.  No further EDCM needs at this time.

## 2015-09-12 NOTE — Discharge Instructions (Signed)

## 2015-09-12 NOTE — ED Provider Notes (Signed)
CSN: [id number]     Arrival date & time 09/12/15  1839 History   First MD Initiated Contact with Patient 09/12/15 1845     Chief Complaint  Patient presents with  . Shortness of Breath    HPI   Leonard Montoya is a 75 y.o. male with a PMH of asthma, obesity, atrial fibrillation, HLD, HTN who presents to the ED with shortness of breath, which he states has been ongoing for the past several weeks. He denies exacerbating or alleviating factors. He states he ran out of all of his prescribed medications, and has not been taking anything. He reports he is on oxygen at home, however used his last oxygen tank last week. He reports associated nonproductive cough. He denies fever, chills, dizziness, lightheadedness, chest pain, abdominal pain, N/V, numbness, weakness, paresthesia.    Past Medical History  Diagnosis Date  . Allergy 1978    rag weed, dust, animal dander, pollen  . Asthma   . Obesity 1974  . Gout   . Atrial fibrillation ([place])   . Arthritis   . Hypertension   . Hyperlipidemia   . Chronic insomnia   . BPH (benign prostatic hypertrophy)    History reviewed. No pertinent past surgical history. Family History  Problem Relation Age of Onset  . Arthritis Mother   . Alcohol abuse Father   . Arthritis Father   . Diabetes Father   . Drug abuse Brother   . Learning disabilities Brother    Social History  Substance Use Topics  . Smoking status: Former Smoker    Quit date: 07/23/1991  . Smokeless tobacco: Never Used  . Alcohol Use: No    Review of Systems  Constitutional: Negative for fever and chills.  Respiratory: Positive for cough and shortness of breath.   Cardiovascular: Positive for leg swelling. Negative for chest pain.  Gastrointestinal: Negative for nausea, vomiting, abdominal pain, diarrhea and constipation.  Neurological: Negative for dizziness, syncope, weakness, light-headedness and numbness.  All other systems reviewed and are negative.     Allergies   Sudafed  Home Medications   Prior to Admission medications   Medication Sig Start Date End Date Taking? Authorizing Provider  albuterol (PROVENTIL HFA;VENTOLIN HFA) 108 (90 BASE) MCG/ACT inhaler Inhale 2 puffs into the lungs every 6 (six) hours as needed for wheezing or shortness of breath.    Historical Provider, MD  allopurinol (ZYLOPRIM) 300 MG tablet Take 300 mg by mouth daily. Reported on 08/01/2015    Historical Provider, MD  Blood Glucose Monitoring Suppl (BLOOD GLUCOSE MONITOR KIT) KIT Blood Glucose Monitor- check FSBS 1x daily. Lancets &Test strips #50. DX:250.00 Patient not taking: Reported on 08/01/2015 10/01/13   Alycia Rossetti, MD  clobetasol ointment (TEMOVATE) 4.09 % Apply 1 application topically 2 (two) times daily. Reported on 08/01/2015    Historical Provider, MD  diltiazem (CARDIZEM CD) 180 MG 24 hr capsule Take 180 mg by mouth daily. Reported on 08/01/2015    Historical Provider, MD  doxazosin (CARDURA) 4 MG tablet Take 4 mg by mouth 2 (two) times daily. Reported on 08/01/2015    Historical Provider, MD  ibuprofen (ADVIL,MOTRIN) 800 MG tablet Take 800 mg by mouth 2 (two) times daily.    Historical Provider, MD  lidocaine (XYLOCAINE) 2 % jelly 1 application as needed. Reported on 08/01/2015    Historical Provider, MD  neomycin-polymyxin-hydrocortisone (CORTISPORIN) 3.5-10000-1 otic suspension Place 3 drops into both ears 2 (two) times daily. Reported on 08/01/2015    Historical  Provider, MD  simvastatin (ZOCOR) 40 MG tablet Take 40 mg by mouth daily. Reported on 08/01/2015    Historical Provider, MD  spironolactone (ALDACTONE) 100 MG tablet Take 100 mg by mouth 2 (two) times daily. Reported on 08/01/2015    Historical Provider, MD  sulfamethoxazole-trimethoprim (BACTRIM DS) 800-160 MG per tablet Take 1 tablet by mouth 2 (two) times daily. Patient not taking: Reported on 08/01/2015 09/27/13   Alycia Rossetti, MD  tamsulosin (FLOMAX) 0.4 MG CAPS capsule Take 0.4 mg by mouth daily.  Reported on 08/01/2015    Historical Provider, MD  temazepam (RESTORIL) 15 MG capsule Take 15 mg by mouth at bedtime. Reported on 08/01/2015    Historical Provider, MD  torsemide (DEMADEX) 20 MG tablet Take 20 mg by mouth 2 (two) times daily. Reported on 08/01/2015    Historical Provider, MD  warfarin (COUMADIN) 5 MG tablet Take 7.5 mg by mouth daily. Reported on 08/01/2015    Historical Provider, MD    BP 144/108 mmHg  Temp(Src) 97.6 F (36.4 C) (Oral)  Resp 16  SpO2 97% Physical Exam  Constitutional: He is oriented to person, place, and time. No distress.  Obese patient in no acute distress.  HENT:  Head: Normocephalic and atraumatic.  Right Ear: External ear normal.  Left Ear: External ear normal.  Nose: Nose normal.  Mouth/Throat: Uvula is midline, oropharynx is clear and moist and mucous membranes are normal.  Eyes: Conjunctivae, EOM and lids are normal. Pupils are equal, round, and reactive to light. Right eye exhibits no discharge. Left eye exhibits no discharge. No scleral icterus.  Neck: Normal range of motion. Neck supple.  Cardiovascular: Normal heart sounds, intact distal pulses and normal pulses.  An irregularly irregular rhythm present. Tachycardia present.   Pulmonary/Chest: Effort normal. No respiratory distress. He has no wheezes. He has no rales.  Decreased breath sounds bilaterally. Poor inspiratory effort. No respiratory distress.  Abdominal: Soft. Normal appearance and bowel sounds are normal. He exhibits no distension and no mass. There is no tenderness. There is no rigidity, no rebound and no guarding.  Musculoskeletal: Normal range of motion. He exhibits edema. He exhibits no tenderness.  2+ edema to lower extremity bilaterally. Chronic venous stasis to lower extremities bilaterally.  Neurological: He is alert and oriented to person, place, and time. He has normal strength. No sensory deficit.  Skin: Skin is warm, dry and intact. No rash noted. He is not diaphoretic.  No erythema. No pallor.  Psychiatric: He has a normal mood and affect. His speech is normal and behavior is normal.  Nursing note and vitals reviewed.   ED Course  Procedures (including critical care time)  Labs Review Labs Reviewed  CBC WITH DIFFERENTIAL/PLATELET  BASIC METABOLIC PANEL  BRAIN NATRIURETIC PEPTIDE  I-STAT [place], ED    Imaging Review Dg Chest 2 View  09/12/2015  CLINICAL DATA:  Short of breath for 2 weeks EXAM: CHEST  2 VIEW COMPARISON:  None. FINDINGS: Mild cardiomegaly. Normal vascularity. Lungs are under aerated with bibasilar hypoaeration change. No definite consolidation. No pneumothorax or pleural effusion. IMPRESSION: Cardiomegaly without decompensation Electronically Signed   By: Marybelle Killings M.D.   On: 09/12/2015 20:14    I have personally reviewed and evaluated these images and lab results as part of my medical decision-making.   EKG Interpretation   Date/Time:  Tuesday September 12 2015 18:49:28 EST Ventricular Rate:  133 PR Interval:    QRS Duration: 99 QT Interval:  343 QTC Calculation: 510 R  Axis:   -84 Text Interpretation:  Atrial fibrillation Ventricular premature complex  Left anterior fascicular block Abnormal R-wave progression, late  transition Prolonged QT interval Confirmed by Jeneen Rinks  MD, [place] ([id number]) on  09/12/2015 6:56:41 PM      MDM   Final diagnoses:  Shortness of breath    75 year old male presents with shortness of breath x several weeks. Reports noncompliance with medications.  Patient is afebrile. Tachycardic. Heart irregularly irregular. Decreased air movement to lungs bilaterally with poor inspiratory effort. No respiratory distress. 2+ edema to lower extremity bilaterally. Chronic venous stasis to lower extremities bilaterally.   EKG atrial fibrillation with RVR, HR 133. Diltiazem drip and bolus ordered. CXR cardiomegaly without decompensation. Patient discussed with and seen by Dr. Jeneen Rinks. Dr. Jeneen Rinks to assume care of  patient at shift change.   BP 144/108 mmHg  Temp(Src) 97.6 F (36.4 C) (Oral)  Resp 16  SpO2 97%     Marella Chimes, PA-C 09/12/15 2036  Tanna Furry, MD 09/18/15 [phone]

## 2015-09-12 NOTE — ED Notes (Addendum)
To room via EMS.  Pt reports shortness of breath has increased in the past several weeks and needs to wear oxygen.  Pt reports he has ran out of oxygen. Pt does not have a PCP, not on any meds.  Pt just ordered an O2 concentrator from [employer]. EMS came out to pts house yesterday but did not bring pt to ED.  Pt is unable to stand, uses an electric wheelchair and uses an engine hoist to get himself out of bed.  Dtr checks on pts at times.  Per EMS house is unkempt.

## 2015-09-12 NOTE — ED Provider Notes (Addendum)
Pt seen and evaluated.  D/W Ellie Wesfall PA-C.  Pt with LE edema at baseline.  Not clinically or radiographically in CHF.  AF c RVR up to 155bpm.  Not febrile or hypoxemic.  Pt off meds for several months. No CP or sign of ACS.  Given Cardizem bolus and qtt.  HR at 117,  Pt in electric Wc + his own hoyer lift at home,  Not able to ambulate at baseline.  Moved from [place] several months ago and no local PCP.  No home O2 as of today (normally only at noc, but with dyspnea last few weeks, wearing 24/7).  Not hypoxemic here, but dyspneic laying supine.  Pt adamant that he will not stay. He is still in AF at 118.  We discussed deterioration, CHF, death, disability as potentials, and he still insists on leaving.  Will stop qtt and give PO Cardizem CD.  Will arrange home health care RN, PT, OT. We'll give him one month his medications. Have asked him to obtain primary care. We'll give him referrals. He is leaving against my advice.   CRITICAL CARE Performed by: Rolland Porter JOSEPH   Total critical care time: 60 minutes  Pt initiated on Cardizem bolus and drip. Will give first dose of PO Cardizem CD prior to DC of qtt and DC AMA.  Critical care time was exclusive of separately billable procedures and treating other patients.  Critical care was necessary to treat or prevent imminent or life-threatening deterioration.  Critical care was time spent personally by me on the following activities: development of treatment plan with patient and/or surrogate as well as nursing, discussions with consultants, evaluation of patient's response to treatment, examination of patient, obtaining history from patient or surrogate, ordering and performing treatments and interventions, ordering and review of laboratory studies, ordering and review of radiographic studies, pulse oximetry and re-evaluation of patient's condition.    Rolland Porter, MD 09/12/15 [id number]  Rolland Porter, MD 09/12/15 1191  Rolland Porter, MD 09/12/15  2201

## 2015-09-12 NOTE — Care Management Note (Addendum)
Case Management Note  Patient Details  Name: Leonard Montoya MRN: [id number] Date of Birth: 1941/03/04  Subjective/Objective:                  Admitted with Shortness of Breath PMH of asthma, obesity, atrial fibrillation, HLD, HTN   Action/Plan: Cm spoke to patient at the bedside who lives at home alone. Patient has hoyer lifts and motorized wheelchair but has difficulty caring for himself. He has the support of his daughtrer intermittently as she works full time. who stated that he was ready to go home and wanted to get his nurse and physician to let him go. Cm explained the risks of him leaving without the appropriate resources and medical care in place and patient continued to insist that he go home despite a complete understanding of the risks.   Cm explained the importance of patient finding and establishing PCP care. Cm provided patient with name and number of [facility] for in home PCP care which is [phone]. Patient at the time was very agreeable to someone coming into his home for MD visits. Cm also advised that for [facility] he likely will need a PCP but CM will attempt to arrange anyways. CM offered patient choice and he chose [facility] for PT/RN/Aide/SW. CM faxed facesheet, face to face, and orders to [facility] for referral. Confirmation received. Cm asked patient about Home oxygen and he said he has been using it intermittently but is out and is not seen with a local agency. Per patient he got the last concentrator from [employer].  CM encouraged patient to follow up with a PCP regarding this and other medical care and understanding verbalized. He did share that he has a CPAP machine at home that he uses.  CM Amy Bennie Dallas attempted to call [facility] to obtain fax number to send referral but they are closed. CM encouraged both daughter and patient to make this contact in the morning. Understanding verbalized.   Patient gave CM permission to call his daughter Leonard Montoya @ [phone]. Cm called and spoke  to daughter who said that the patient has gotten rid of all help anytime it is setup. Patient was setup with [facility] and did not like how the nurse spoke to him so he would not let them come to the home. Daughter said that they have attempted to arrange ALF or SNF and patient refuses to go. CM provided daughter with contact information for [facility], [facility], and [facility]. Patient was also told about this information and understood the importance of their involvement. Cm remains available for any further needs but per MD and RN, patient planning to leave AMA.   Expected Discharge Date:  09/12/15              Expected Discharge Plan:  Home w Home Health Services  In-House Referral:     Discharge planning Services  CM Consult  Post Acute Care Choice:  Home Health Choice offered to:  Patient, Adult Children  DME Arranged:    DME Agency:     HH Arranged:  RN, PT, Nurse's Aide, Social Work [employer] Agency:  [facility]  Status of Service:  In process, will continue to follow  Medicare Important Message Given:    Date Medicare IM Given:    Medicare IM give by:    Date Additional Medicare IM Given:    Additional Medicare Important Message give by:     If discussed at Long  Length of Stay Meetings, dates discussed:    Additional Comments:  Darcel Smalling, RN 09/12/2015, 10:08 PM

## 2015-09-12 NOTE — ED Notes (Signed)
This nurse overheard pt on phone talking to dtr, stating that he is leaving, dtr told him she could not come get him.  This nurse advised Dr. Fayrene Fearing, MD in to see pt.  Pt expressed desire to return home.  MD will provide one month prescriptions but no oxygen prescription, as oxygen is pts concern.

## 2015-09-13 ENCOUNTER — Encounter: Payer: Self-pay | Admitting: *Deleted

## 2015-09-13 DIAGNOSIS — R0602 Shortness of breath: Secondary | ICD-10-CM | POA: Diagnosis not present

## 2015-09-13 NOTE — Progress Notes (Addendum)
EDCM spoke to Leonard Montoya at [facility]. Leonard Montoya has provided fax number [phone] to fax referral to establish care for home visits as patient is unable to leave his house.  [facility] faxed patient referral to [facility] with confirmation of receipt per patient request.  No further [facility] needs at this time.

## 2015-09-13 NOTE — Progress Notes (Signed)
Pt not eligible for [facility] until he has PCP so that [facility] agency may sign/update orders.  Placed call to pt and daughter to inform them of situation.  CM will assist in obtaining appt with [facility] or [facility].

## 2015-09-14 NOTE — Progress Notes (Addendum)
09/14/2015 1546pm.  EDCM received phone call from [place] of [facility] and reports Marletta Lor NP will pick up this patient but will not be able to see him until the beginning of April.  [facility] sent message to Clydie Braun of [facility] to see if patient will be able to be seen.  [facility] will notify patient.  09/14/2015 A. Hermelinda Diegel RNCM 1601pm EDCM received phone call from Clydie Braun of [facility] who reports [facility] will not be able to see patient because it would leave patient a month without a doctor to sign orders.  Marletta Lor NP would have to see patient and write home health orders again for [facility] to see patient.  EDCM called and spoke to patient and informed him of above.  EDCM offered to make an appointment with someone else in the mean time, but patient refused.  Patient thankful for services.  [facility] informed patient that phone number for [facility] is on his discharge instructions.  No further EDCM needs at this time.

## 2015-10-17 ENCOUNTER — Other Ambulatory Visit: Payer: Self-pay | Admitting: *Deleted

## 2015-10-17 NOTE — Patient Outreach (Signed)
Follow up call to daughter Forbes CellarSandy Mericka (called earlier, RN CM could not talk).   Spoke with daughter Andrey CampanileSandy, HIPPA verified on pt, wanted to find out pt's eligibility [facility].   RN CM informed daughter pt needs to have a [facility], needs to f/u and referral can be sent for [facility] services.  Daughter  reports transportation was an issue, to look into transportation for pt.     Shayne Alkenose M.   Avyay Coger RN CCM [facility]  [phone]

## 2015-10-20 ENCOUNTER — Inpatient Hospital Stay (HOSPITAL_COMMUNITY)
Admission: EM | Admit: 2015-10-20 | Discharge: 2015-10-25 | DRG: 871 | Disposition: A | Discharge status: Skilled Nursing Facility | Payer: Commercial Managed Care - HMO | Attending: Internal Medicine | Admitting: Internal Medicine

## 2015-10-20 DIAGNOSIS — I4891 Unspecified atrial fibrillation: Secondary | ICD-10-CM | POA: Diagnosis not present

## 2015-10-20 DIAGNOSIS — R069 Unspecified abnormalities of breathing: Secondary | ICD-10-CM | POA: Diagnosis not present

## 2015-10-20 DIAGNOSIS — I248 Other forms of acute ischemic heart disease: Secondary | ICD-10-CM | POA: Diagnosis present

## 2015-10-20 DIAGNOSIS — A419 Sepsis, unspecified organism: Secondary | ICD-10-CM | POA: Diagnosis not present

## 2015-10-20 DIAGNOSIS — Z8261 Family history of arthritis: Secondary | ICD-10-CM

## 2015-10-20 DIAGNOSIS — R0902 Hypoxemia: Secondary | ICD-10-CM

## 2015-10-20 DIAGNOSIS — L8990 Pressure ulcer of unspecified site, unspecified stage: Secondary | ICD-10-CM | POA: Insufficient documentation

## 2015-10-20 DIAGNOSIS — J45909 Unspecified asthma, uncomplicated: Secondary | ICD-10-CM | POA: Diagnosis present

## 2015-10-20 DIAGNOSIS — Z9119 Patient's noncompliance with other medical treatment and regimen: Secondary | ICD-10-CM | POA: Diagnosis not present

## 2015-10-20 DIAGNOSIS — J449 Chronic obstructive pulmonary disease, unspecified: Secondary | ICD-10-CM | POA: Diagnosis present

## 2015-10-20 DIAGNOSIS — N390 Urinary tract infection, site not specified: Secondary | ICD-10-CM | POA: Diagnosis present

## 2015-10-20 DIAGNOSIS — Z515 Encounter for palliative care: Secondary | ICD-10-CM | POA: Insufficient documentation

## 2015-10-20 DIAGNOSIS — Z9981 Dependence on supplemental oxygen: Secondary | ICD-10-CM | POA: Diagnosis not present

## 2015-10-20 DIAGNOSIS — E875 Hyperkalemia: Secondary | ICD-10-CM | POA: Diagnosis not present

## 2015-10-20 DIAGNOSIS — R0602 Shortness of breath: Secondary | ICD-10-CM | POA: Diagnosis not present

## 2015-10-20 DIAGNOSIS — E785 Hyperlipidemia, unspecified: Secondary | ICD-10-CM | POA: Diagnosis present

## 2015-10-20 DIAGNOSIS — Z7189 Other specified counseling: Secondary | ICD-10-CM | POA: Insufficient documentation

## 2015-10-20 DIAGNOSIS — I110 Hypertensive heart disease with heart failure: Secondary | ICD-10-CM | POA: Diagnosis present

## 2015-10-20 DIAGNOSIS — D696 Thrombocytopenia, unspecified: Secondary | ICD-10-CM | POA: Diagnosis not present

## 2015-10-20 DIAGNOSIS — L89313 Pressure ulcer of right buttock, stage 3: Secondary | ICD-10-CM | POA: Diagnosis present

## 2015-10-20 DIAGNOSIS — Z9114 Patient's other noncompliance with medication regimen: Secondary | ICD-10-CM

## 2015-10-20 DIAGNOSIS — R0682 Tachypnea, not elsewhere classified: Secondary | ICD-10-CM | POA: Diagnosis present

## 2015-10-20 DIAGNOSIS — F419 Anxiety disorder, unspecified: Secondary | ICD-10-CM | POA: Diagnosis not present

## 2015-10-20 DIAGNOSIS — Z833 Family history of diabetes mellitus: Secondary | ICD-10-CM

## 2015-10-20 DIAGNOSIS — M6281 Muscle weakness (generalized): Secondary | ICD-10-CM | POA: Diagnosis not present

## 2015-10-20 DIAGNOSIS — R4182 Altered mental status, unspecified: Secondary | ICD-10-CM

## 2015-10-20 DIAGNOSIS — Z7901 Long term (current) use of anticoagulants: Secondary | ICD-10-CM | POA: Diagnosis not present

## 2015-10-20 DIAGNOSIS — I5021 Acute systolic (congestive) heart failure: Secondary | ICD-10-CM | POA: Diagnosis present

## 2015-10-20 DIAGNOSIS — R4701 Aphasia: Secondary | ICD-10-CM | POA: Diagnosis present

## 2015-10-20 DIAGNOSIS — B964 Proteus (mirabilis) (morganii) as the cause of diseases classified elsewhere: Secondary | ICD-10-CM | POA: Diagnosis present

## 2015-10-20 DIAGNOSIS — I5040 Unspecified combined systolic (congestive) and diastolic (congestive) heart failure: Secondary | ICD-10-CM | POA: Diagnosis not present

## 2015-10-20 DIAGNOSIS — Z811 Family history of alcohol abuse and dependence: Secondary | ICD-10-CM | POA: Diagnosis not present

## 2015-10-20 DIAGNOSIS — S0990XA Unspecified injury of head, initial encounter: Secondary | ICD-10-CM | POA: Diagnosis not present

## 2015-10-20 DIAGNOSIS — I509 Heart failure, unspecified: Secondary | ICD-10-CM | POA: Diagnosis not present

## 2015-10-20 DIAGNOSIS — N179 Acute kidney failure, unspecified: Secondary | ICD-10-CM

## 2015-10-20 DIAGNOSIS — I482 Chronic atrial fibrillation: Secondary | ICD-10-CM | POA: Diagnosis not present

## 2015-10-20 DIAGNOSIS — N400 Enlarged prostate without lower urinary tract symptoms: Secondary | ICD-10-CM | POA: Diagnosis present

## 2015-10-20 DIAGNOSIS — Z7401 Bed confinement status: Secondary | ICD-10-CM | POA: Diagnosis not present

## 2015-10-20 DIAGNOSIS — J440 Chronic obstructive pulmonary disease with acute lower respiratory infection: Secondary | ICD-10-CM | POA: Diagnosis present

## 2015-10-20 DIAGNOSIS — Z6841 Body Mass Index (BMI) 40.0 and over, adult: Secondary | ICD-10-CM

## 2015-10-20 DIAGNOSIS — G912 (Idiopathic) normal pressure hydrocephalus: Secondary | ICD-10-CM | POA: Diagnosis present

## 2015-10-20 DIAGNOSIS — Z87891 Personal history of nicotine dependence: Secondary | ICD-10-CM

## 2015-10-20 DIAGNOSIS — R296 Repeated falls: Secondary | ICD-10-CM | POA: Diagnosis not present

## 2015-10-20 DIAGNOSIS — R32 Unspecified urinary incontinence: Secondary | ICD-10-CM | POA: Diagnosis present

## 2015-10-20 DIAGNOSIS — I10 Essential (primary) hypertension: Secondary | ICD-10-CM | POA: Diagnosis not present

## 2015-10-20 DIAGNOSIS — J189 Pneumonia, unspecified organism: Secondary | ICD-10-CM | POA: Diagnosis not present

## 2015-10-20 DIAGNOSIS — Z66 Do not resuscitate: Secondary | ICD-10-CM | POA: Diagnosis present

## 2015-10-20 DIAGNOSIS — I480 Paroxysmal atrial fibrillation: Secondary | ICD-10-CM | POA: Diagnosis not present

## 2015-10-21 ENCOUNTER — Encounter (HOSPITAL_COMMUNITY): Payer: Self-pay | Admitting: Emergency Medicine

## 2015-10-21 ENCOUNTER — Inpatient Hospital Stay (HOSPITAL_COMMUNITY): Payer: Commercial Managed Care - HMO

## 2015-10-21 ENCOUNTER — Emergency Department (HOSPITAL_COMMUNITY): Payer: Commercial Managed Care - HMO

## 2015-10-21 DIAGNOSIS — J440 Chronic obstructive pulmonary disease with acute lower respiratory infection: Secondary | ICD-10-CM | POA: Diagnosis present

## 2015-10-21 DIAGNOSIS — N179 Acute kidney failure, unspecified: Secondary | ICD-10-CM | POA: Diagnosis present

## 2015-10-21 DIAGNOSIS — Z9114 Patient's other noncompliance with medication regimen: Secondary | ICD-10-CM | POA: Diagnosis not present

## 2015-10-21 DIAGNOSIS — Z6841 Body Mass Index (BMI) 40.0 and over, adult: Secondary | ICD-10-CM | POA: Diagnosis not present

## 2015-10-21 DIAGNOSIS — Z9981 Dependence on supplemental oxygen: Secondary | ICD-10-CM | POA: Diagnosis not present

## 2015-10-21 DIAGNOSIS — J45909 Unspecified asthma, uncomplicated: Secondary | ICD-10-CM | POA: Diagnosis present

## 2015-10-21 DIAGNOSIS — J449 Chronic obstructive pulmonary disease, unspecified: Secondary | ICD-10-CM | POA: Diagnosis present

## 2015-10-21 DIAGNOSIS — I110 Hypertensive heart disease with heart failure: Secondary | ICD-10-CM | POA: Diagnosis present

## 2015-10-21 DIAGNOSIS — I4891 Unspecified atrial fibrillation: Secondary | ICD-10-CM | POA: Diagnosis present

## 2015-10-21 DIAGNOSIS — Z833 Family history of diabetes mellitus: Secondary | ICD-10-CM | POA: Diagnosis not present

## 2015-10-21 DIAGNOSIS — Z7401 Bed confinement status: Secondary | ICD-10-CM | POA: Diagnosis not present

## 2015-10-21 DIAGNOSIS — D696 Thrombocytopenia, unspecified: Secondary | ICD-10-CM

## 2015-10-21 DIAGNOSIS — Z811 Family history of alcohol abuse and dependence: Secondary | ICD-10-CM | POA: Diagnosis not present

## 2015-10-21 DIAGNOSIS — E785 Hyperlipidemia, unspecified: Secondary | ICD-10-CM | POA: Diagnosis present

## 2015-10-21 DIAGNOSIS — N390 Urinary tract infection, site not specified: Secondary | ICD-10-CM | POA: Diagnosis present

## 2015-10-21 DIAGNOSIS — I509 Heart failure, unspecified: Secondary | ICD-10-CM | POA: Diagnosis not present

## 2015-10-21 DIAGNOSIS — J189 Pneumonia, unspecified organism: Secondary | ICD-10-CM | POA: Diagnosis present

## 2015-10-21 DIAGNOSIS — N400 Enlarged prostate without lower urinary tract symptoms: Secondary | ICD-10-CM | POA: Diagnosis present

## 2015-10-21 DIAGNOSIS — I248 Other forms of acute ischemic heart disease: Secondary | ICD-10-CM | POA: Diagnosis present

## 2015-10-21 DIAGNOSIS — R4182 Altered mental status, unspecified: Secondary | ICD-10-CM | POA: Diagnosis present

## 2015-10-21 DIAGNOSIS — Z9119 Patient's noncompliance with other medical treatment and regimen: Secondary | ICD-10-CM | POA: Diagnosis not present

## 2015-10-21 DIAGNOSIS — Z87891 Personal history of nicotine dependence: Secondary | ICD-10-CM | POA: Diagnosis not present

## 2015-10-21 DIAGNOSIS — A419 Sepsis, unspecified organism: Secondary | ICD-10-CM | POA: Diagnosis present

## 2015-10-21 DIAGNOSIS — I10 Essential (primary) hypertension: Secondary | ICD-10-CM

## 2015-10-21 DIAGNOSIS — L89313 Pressure ulcer of right buttock, stage 3: Secondary | ICD-10-CM | POA: Diagnosis present

## 2015-10-21 DIAGNOSIS — R0902 Hypoxemia: Secondary | ICD-10-CM | POA: Diagnosis present

## 2015-10-21 DIAGNOSIS — I5021 Acute systolic (congestive) heart failure: Secondary | ICD-10-CM | POA: Diagnosis present

## 2015-10-21 DIAGNOSIS — R4701 Aphasia: Secondary | ICD-10-CM | POA: Diagnosis present

## 2015-10-21 DIAGNOSIS — R32 Unspecified urinary incontinence: Secondary | ICD-10-CM | POA: Diagnosis present

## 2015-10-21 DIAGNOSIS — G912 (Idiopathic) normal pressure hydrocephalus: Secondary | ICD-10-CM | POA: Diagnosis present

## 2015-10-21 DIAGNOSIS — R0602 Shortness of breath: Secondary | ICD-10-CM | POA: Diagnosis present

## 2015-10-21 DIAGNOSIS — Z7901 Long term (current) use of anticoagulants: Secondary | ICD-10-CM | POA: Diagnosis not present

## 2015-10-21 DIAGNOSIS — B964 Proteus (mirabilis) (morganii) as the cause of diseases classified elsewhere: Secondary | ICD-10-CM | POA: Diagnosis present

## 2015-10-21 DIAGNOSIS — E875 Hyperkalemia: Secondary | ICD-10-CM | POA: Diagnosis not present

## 2015-10-21 DIAGNOSIS — R0682 Tachypnea, not elsewhere classified: Secondary | ICD-10-CM | POA: Diagnosis present

## 2015-10-21 DIAGNOSIS — Z66 Do not resuscitate: Secondary | ICD-10-CM | POA: Diagnosis present

## 2015-10-21 DIAGNOSIS — Z515 Encounter for palliative care: Secondary | ICD-10-CM | POA: Diagnosis not present

## 2015-10-21 DIAGNOSIS — Z8261 Family history of arthritis: Secondary | ICD-10-CM | POA: Diagnosis not present

## 2015-10-21 LAB — I-STAT ARTERIAL BLOOD GAS, ED
Acid-base deficit: 1 mmol/L (ref 0.0–2.0)
Bicarbonate: 22.9 mEq/L (ref 20.0–24.0)
O2 SAT: 94 %
PCO2 ART: 37 mmHg (ref 35.0–45.0)
PH ART: 7.403 (ref 7.350–7.450)
PO2 ART: 74 mmHg — AB (ref 80.0–100.0)
Patient temperature: 99.7
TCO2: 24 mmol/L (ref 0–100)

## 2015-10-21 LAB — URINE MICROSCOPIC-ADD ON
RBC / HPF: NONE SEEN RBC/hpf (ref 0–5)
Squamous Epithelial / LPF: NONE SEEN
WBC UA: NONE SEEN WBC/hpf (ref 0–5)

## 2015-10-21 LAB — HEPATIC FUNCTION PANEL
ALT: 39 U/L (ref 17–63)
AST: 55 U/L — AB (ref 15–41)
Albumin: 2.4 g/dL — ABNORMAL LOW (ref 3.5–5.0)
Alkaline Phosphatase: 161 U/L — ABNORMAL HIGH (ref 38–126)
BILIRUBIN DIRECT: 0.9 mg/dL — AB (ref 0.1–0.5)
BILIRUBIN TOTAL: 2.2 mg/dL — AB (ref 0.3–1.2)
Indirect Bilirubin: 1.3 mg/dL — ABNORMAL HIGH (ref 0.3–0.9)
Total Protein: 6.3 g/dL — ABNORMAL LOW (ref 6.5–8.1)

## 2015-10-21 LAB — BASIC METABOLIC PANEL
ANION GAP: 13 (ref 5–15)
Anion gap: 15 (ref 5–15)
BUN: 51 mg/dL — AB (ref 6–20)
BUN: 58 mg/dL — AB (ref 6–20)
CHLORIDE: 99 mmol/L — AB (ref 101–111)
CO2: 21 mmol/L — AB (ref 22–32)
CO2: 23 mmol/L (ref 22–32)
Calcium: 8.5 mg/dL — ABNORMAL LOW (ref 8.9–10.3)
Calcium: 8.6 mg/dL — ABNORMAL LOW (ref 8.9–10.3)
Chloride: 100 mmol/L — ABNORMAL LOW (ref 101–111)
Creatinine, Ser: 1.41 mg/dL — ABNORMAL HIGH (ref 0.61–1.24)
Creatinine, Ser: 1.51 mg/dL — ABNORMAL HIGH (ref 0.61–1.24)
GFR calc Af Amer: 50 mL/min — ABNORMAL LOW (ref >60)
GFR calc Af Amer: 54 mL/min — ABNORMAL LOW (ref >60)
GFR calc non Af Amer: 43 mL/min — ABNORMAL LOW (ref >60)
GFR, EST NON AFRICAN AMERICAN: 47 mL/min — AB (ref >60)
Glucose, Bld: 206 mg/dL — ABNORMAL HIGH (ref 65–99)
Glucose, Bld: 220 mg/dL — ABNORMAL HIGH (ref 65–99)
POTASSIUM: 3.3 mmol/L — AB (ref 3.5–5.1)
POTASSIUM: 4 mmol/L (ref 3.5–5.1)
SODIUM: 135 mmol/L (ref 135–145)
SODIUM: 136 mmol/L (ref 135–145)

## 2015-10-21 LAB — URINALYSIS, ROUTINE W REFLEX MICROSCOPIC
BILIRUBIN URINE: NEGATIVE
GLUCOSE, UA: NEGATIVE mg/dL
KETONES UR: NEGATIVE mg/dL
Nitrite: POSITIVE — AB
PROTEIN: 30 mg/dL — AB
Specific Gravity, Urine: 1.012 (ref 1.005–1.030)
pH: 8.5 — ABNORMAL HIGH (ref 5.0–8.0)

## 2015-10-21 LAB — TROPONIN I
TROPONIN I: 0.48 ng/mL — AB (ref <0.031)
TROPONIN I: 0.13 ng/mL — AB (ref <0.031)
TROPONIN I: 0.33 ng/mL — AB (ref <0.031)

## 2015-10-21 LAB — CBC
HCT: 44.7 % (ref 39.0–52.0)
Hemoglobin: 15.1 g/dL (ref 13.0–17.0)
MCH: 30.4 pg (ref 26.0–34.0)
MCHC: 33.8 g/dL (ref 30.0–36.0)
MCV: 89.9 fL (ref 78.0–100.0)
PLATELETS: 97 10*3/uL — AB (ref 150–400)
RBC: 4.97 MIL/uL (ref 4.22–5.81)
RDW: 13.8 % (ref 11.5–15.5)
WBC: 24.2 10*3/uL — AB (ref 4.0–10.5)

## 2015-10-21 LAB — MRSA PCR SCREENING: MRSA BY PCR: NEGATIVE

## 2015-10-21 LAB — LACTIC ACID, PLASMA: Lactic Acid, Venous: 3 mmol/L (ref 0.5–2.0)

## 2015-10-21 LAB — I-STAT TROPONIN, ED: TROPONIN I, POC: 0.16 ng/mL — AB (ref 0.00–0.08)

## 2015-10-21 LAB — GLUCOSE, CAPILLARY
GLUCOSE-CAPILLARY: 198 mg/dL — AB (ref 65–99)
Glucose-Capillary: 187 mg/dL — ABNORMAL HIGH (ref 65–99)
Glucose-Capillary: 211 mg/dL — ABNORMAL HIGH (ref 65–99)
Glucose-Capillary: 233 mg/dL — ABNORMAL HIGH (ref 65–99)

## 2015-10-21 LAB — PROCALCITONIN: Procalcitonin: 7.13 ng/mL

## 2015-10-21 LAB — STREP PNEUMONIAE URINARY ANTIGEN: Strep Pneumo Urinary Antigen: NEGATIVE

## 2015-10-21 LAB — BRAIN NATRIURETIC PEPTIDE: B Natriuretic Peptide: 453.5 pg/mL — ABNORMAL HIGH (ref 0.0–100.0)

## 2015-10-21 LAB — PROTIME-INR
INR: 1.29 (ref 0.00–1.49)
Prothrombin Time: 16.2 seconds — ABNORMAL HIGH (ref 11.6–15.2)

## 2015-10-21 MED ORDER — FUROSEMIDE 10 MG/ML IJ SOLN
60.0000 mg | Freq: Two times a day (BID) | INTRAMUSCULAR | Status: DC
  Administered 2015-10-21: 60 mg via INTRAVENOUS
  Filled 2015-10-21: qty 6

## 2015-10-21 MED ORDER — METHYLPREDNISOLONE SODIUM SUCC 40 MG IJ SOLR
40.0000 mg | Freq: Two times a day (BID) | INTRAMUSCULAR | Status: DC
  Administered 2015-10-21 – 2015-10-23 (×4): 40 mg via INTRAVENOUS
  Filled 2015-10-21 (×5): qty 1

## 2015-10-21 MED ORDER — SIMVASTATIN 40 MG PO TABS
40.0000 mg | ORAL_TABLET | Freq: Every day | ORAL | Status: DC
  Administered 2015-10-21 – 2015-10-25 (×5): 40 mg via ORAL
  Filled 2015-10-21 (×5): qty 1

## 2015-10-21 MED ORDER — DEXTROSE 5 % IV SOLN
1.0000 g | Freq: Once | INTRAVENOUS | Status: AC
  Administered 2015-10-21: 1 g via INTRAVENOUS
  Filled 2015-10-21: qty 10

## 2015-10-21 MED ORDER — ONDANSETRON HCL 4 MG/2ML IJ SOLN: 4.0000 mg | Freq: Four times a day (QID) | INTRAMUSCULAR | Status: DC | PRN

## 2015-10-21 MED ORDER — DIGOXIN 0.25 MG/ML IJ SOLN
0.1250 mg | Freq: Every day | INTRAMUSCULAR | Status: DC
  Administered 2015-10-21 – 2015-10-25 (×5): 0.125 mg via INTRAVENOUS
  Filled 2015-10-21 (×5): qty 2

## 2015-10-21 MED ORDER — SODIUM CHLORIDE 0.9 % IV SOLN
INTRAVENOUS | Status: AC
Start: 2015-10-21 — End: 2015-10-21
  Administered 2015-10-21: 1000 mL via INTRAVENOUS

## 2015-10-21 MED ORDER — ENOXAPARIN SODIUM 40 MG/0.4ML ~~LOC~~ SOLN
40.0000 mg | SUBCUTANEOUS | Status: DC
  Administered 2015-10-21 – 2015-10-22 (×2): 40 mg via SUBCUTANEOUS
  Filled 2015-10-21 (×2): qty 0.4

## 2015-10-21 MED ORDER — SODIUM CHLORIDE 0.9 % IV SOLN: 250.0000 mL | INTRAVENOUS | Status: DC | PRN

## 2015-10-21 MED ORDER — DILTIAZEM HCL 100 MG IV SOLR
5.0000 mg/h | INTRAVENOUS | Status: DC
  Administered 2015-10-21: 5 mg/h via INTRAVENOUS
  Administered 2015-10-21 (×2): 10 mg/h via INTRAVENOUS
  Administered 2015-10-21 – 2015-10-22 (×3): 15 mg/h via INTRAVENOUS
  Filled 2015-10-21 (×7): qty 100

## 2015-10-21 MED ORDER — ASPIRIN EC 81 MG PO TBEC
81.0000 mg | DELAYED_RELEASE_TABLET | Freq: Every day | ORAL | Status: DC
  Administered 2015-10-21 – 2015-10-25 (×5): 81 mg via ORAL
  Filled 2015-10-21 (×5): qty 1

## 2015-10-21 MED ORDER — DILTIAZEM LOAD VIA INFUSION
20.0000 mg | Freq: Once | INTRAVENOUS | Status: AC
  Administered 2015-10-21: 20 mg via INTRAVENOUS
  Filled 2015-10-21: qty 20

## 2015-10-21 MED ORDER — TAMSULOSIN HCL 0.4 MG PO CAPS
0.4000 mg | ORAL_CAPSULE | Freq: Every day | ORAL | Status: DC
  Administered 2015-10-21 – 2015-10-25 (×5): 0.4 mg via ORAL
  Filled 2015-10-21 (×5): qty 1

## 2015-10-21 MED ORDER — FUROSEMIDE 10 MG/ML IJ SOLN
80.0000 mg | Freq: Once | INTRAMUSCULAR | Status: AC
  Administered 2015-10-21: 80 mg via INTRAVENOUS
  Filled 2015-10-21: qty 8

## 2015-10-21 MED ORDER — CETYLPYRIDINIUM CHLORIDE 0.05 % MT LIQD
7.0000 mL | Freq: Two times a day (BID) | OROMUCOSAL | Status: DC
  Administered 2015-10-21 – 2015-10-25 (×9): 7 mL via OROMUCOSAL

## 2015-10-21 MED ORDER — HEPARIN BOLUS VIA INFUSION
3000.0000 [IU] | Freq: Once | INTRAVENOUS | Status: DC
  Filled 2015-10-21: qty 3000

## 2015-10-21 MED ORDER — INSULIN ASPART 100 UNIT/ML ~~LOC~~ SOLN
0.0000 [IU] | Freq: Three times a day (TID) | SUBCUTANEOUS | Status: DC
  Administered 2015-10-21 (×2): 3 [IU] via SUBCUTANEOUS
  Administered 2015-10-21 – 2015-10-22 (×3): 5 [IU] via SUBCUTANEOUS
  Administered 2015-10-22: 11 [IU] via SUBCUTANEOUS
  Administered 2015-10-23: 5 [IU] via SUBCUTANEOUS
  Administered 2015-10-23: 3 [IU] via SUBCUTANEOUS
  Administered 2015-10-23: 8 [IU] via SUBCUTANEOUS
  Administered 2015-10-24: 3 [IU] via SUBCUTANEOUS
  Administered 2015-10-24: 5 [IU] via SUBCUTANEOUS
  Administered 2015-10-24: 3 [IU] via SUBCUTANEOUS
  Administered 2015-10-25: 5 [IU] via SUBCUTANEOUS
  Administered 2015-10-25: 3 [IU] via SUBCUTANEOUS
  Administered 2015-10-25: 2 [IU] via SUBCUTANEOUS

## 2015-10-21 MED ORDER — DEXTROSE 5 % IV SOLN: 500.0000 mg | Freq: Once | INTRAVENOUS | Status: DC

## 2015-10-21 MED ORDER — HEPARIN (PORCINE) IN NACL 100-0.45 UNIT/ML-% IJ SOLN
1500.0000 [IU]/h | INTRAMUSCULAR | Status: DC
  Filled 2015-10-21: qty 250

## 2015-10-21 MED ORDER — SODIUM CHLORIDE 0.9% FLUSH: 3.0000 mL | INTRAVENOUS | Status: DC | PRN

## 2015-10-21 MED ORDER — SODIUM CHLORIDE 0.9% FLUSH
3.0000 mL | Freq: Two times a day (BID) | INTRAVENOUS | Status: DC
  Administered 2015-10-21 – 2015-10-25 (×8): 3 mL via INTRAVENOUS

## 2015-10-21 MED ORDER — ACETAMINOPHEN 325 MG PO TABS
650.0000 mg | ORAL_TABLET | ORAL | Status: DC | PRN
  Administered 2015-10-22 – 2015-10-25 (×2): 650 mg via ORAL
  Filled 2015-10-21 (×2): qty 2

## 2015-10-21 MED ORDER — DILTIAZEM LOAD VIA INFUSION
15.0000 mg | Freq: Once | INTRAVENOUS | Status: AC
  Administered 2015-10-21: 10 mg via INTRAVENOUS
  Filled 2015-10-21: qty 15

## 2015-10-21 NOTE — Evaluation (Signed)
Physical Therapy Evaluation Patient Details Name: Leonard Montoya MRN: [id number] DOB: November 12, 1940 Today's Date: 10/21/2015   History of Present Illness  Patient is a 75 yo male admitted 10/20/15 after being found down by daughter.  Patient with Afib with RVR, sepsis, CAP, UTI, AKI, AMS, CHF, speech deficits.  MRI to r/o CVA pending.    PMH:  HTN, HLD, asthma, Afib, CHF, obesity, on home O2 at 3 l/min    Clinical Impression  Patient presents with problems listed below.  Will benefit from acute PT to maximize functional mobility prior to discharge.  Patient requiring total assist for mobility today.  Recommend SNF at discharge for continued therapy.    Follow Up Recommendations SNF;Supervision/Assistance - 24 hour    Equipment Recommendations  Wheelchair (measurements PT);Wheelchair cushion (measurements PT)    Recommendations for Other Services       Precautions / Restrictions Precautions Precautions: Fall Restrictions Weight Bearing Restrictions: No      Mobility  Bed Mobility Overal bed mobility: Needs Assistance;+2 for physical assistance Bed Mobility: Rolling Rolling: Total assist;+2 for physical assistance         General bed mobility comments: Attempted rolling.  Patient attempting to use UE's on rail to initiate rolling.  Able to move head and minimally his shoulders.  Otherwise requires total assist.  Transfers                 General transfer comment: NT  Ambulation/Gait                Stairs            Wheelchair Mobility    Modified Rankin (Stroke Patients Only)       Balance                                             Pertinent Vitals/Pain Pain Assessment: No/denies pain    Home Living Family/patient expects to be discharged to:: Skilled nursing facility                      Prior Function           Comments: Unsure. Pt unable to provide information. Unable to reach daughter by phone.      Hand Dominance        Extremity/Trunk Assessment   Upper Extremity Assessment: Generalized weakness (Strength grossly 3-/5 bil. UE's)           Lower Extremity Assessment: Generalized weakness (Strength grossly 2+/5 BLE's)         Communication   Communication: Expressive difficulties;HOH  Cognition Arousal/Alertness: Awake/alert Behavior During Therapy: WFL for tasks assessed/performed Overall Cognitive Status: Difficult to assess (Able to follow commands inconsistently.)                      General Comments      Exercises        Assessment/Plan    PT Assessment Patient needs continued PT services  PT Diagnosis Difficulty walking;Generalized weakness;Altered mental status   PT Problem List Decreased strength;Decreased range of motion;Decreased activity tolerance;Decreased balance;Decreased mobility;Decreased cognition;Decreased knowledge of use of DME;Cardiopulmonary status limiting activity;Obesity  PT Treatment Interventions DME instruction;Gait training;Functional mobility training;Therapeutic activities;Therapeutic exercise;Balance training;Cognitive remediation;Patient/family education   PT Goals (Current goals can be found in the Care Plan section) Acute Rehab PT Goals Patient Stated Goal:  None stated PT Goal Formulation: With patient Time For Goal Achievement: 11/04/15 Potential to Achieve Goals: Fair    Frequency Min 2X/week   Barriers to discharge        Co-evaluation               End of Session Equipment Utilized During Treatment: Oxygen Activity Tolerance: Patient limited by fatigue Patient left: in bed;with call bell/phone within reach;with bed alarm set Nurse Communication: Mobility status;Need for lift equipment         Time: [id number] PT Time Calculation (min) (ACUTE ONLY): 14 min   Charges:   PT Evaluation $PT Eval Moderate Complexity: 1 Procedure     PT G Codes:        Vena AustriaDavis, Akasha Melena H 10/21/2015, 4:41  PM Durenda HurtSusan H. Renaldo Fiddleravis, PT, [facility] Acute Rehab Services Pager [phone]

## 2015-10-21 NOTE — H&P (Signed)
[facility] History and Physical  Leonard Montoya GPQ:[id number] DOB: 1941/02/26 DOA: 10/20/2015  Referring physician: ED physician PCP: No PCP Per Patient  Specialists: None listed  Chief Complaint:  Found down by daughter  HPI: Leonard Montoya is a 75 y.o. male with PMH of hypertension, hyperlipidemia, asthma, and atrial fibrillation who is brought in to the ED after being found down by his daughter. Patient is chronically ill and, unfortunately, has not been adherent to his medical treatment plan. He was found by his daughter to be lying on the floor unresponsive with foul urine odor. He was arousable, but per report, very weak and lethargic. He is prescribed 3 L/m supplemental oxygen around the clock. He was not wearing his oxygen upon EMS arrival. He was found to have wheezes throughout and was given DuoNeb treatment en route to the hospital.  In ED, patient was found to be afebrile, saturating adequately with 3 L/m supplemental oxygen, tachypneic to 29, and in atrial fibrillation with RVR. EKG featured atrial fibrillation with rate of 167 and left anterior fascicular block. Chest x-ray demonstrates vascular congestion and cardiomegaly. A right basilar pneumonia cannot be excluded on this film. CMP is notable for serum creatinine 1.41, up from an apparent baseline of 0.87. LFTs are mildly elevated. CBC features a leukocytosis to 24,200 and platelet count of 97,000. Troponin is elevated to 0.16 and BNP to 454. Patient was given a single dose of Lasix 80 mg IV and treated empirically with Rocephin and azithromycin. Diltiazem IV push was administered 2 and he was started on diltiazem infusion. He will be admitted to the stepdown unit for ongoing evaluation and management of sepsis suspected secondary to pneumonia, with possible UTI, and with atrial fibrillation in RVR.   Where does patient live?   At home     Can patient participate in ADLs?  Some   Review of Systems:  Unable to obtain ROS  secondary to patient's clinical condition with acute confusional state.     Allergy:  Allergies  Allergen Reactions  . Sudafed [Pseudoephedrine Hcl]     Weird Dreams    Past Medical History  Diagnosis Date  . Allergy 1978    rag weed, dust, animal dander, pollen  . Asthma   . Obesity 1974  . Gout   . Atrial fibrillation ([place])   . Arthritis   . Hypertension   . Hyperlipidemia   . Chronic insomnia   . BPH (benign prostatic hypertrophy)     History reviewed. No pertinent past surgical history.  Social History:  reports that he quit smoking about 24 years ago. He has never used smokeless tobacco. He reports that he does not drink alcohol or use illicit drugs.  Family History:  Family History  Problem Relation Age of Onset  . Arthritis Mother   . Alcohol abuse Father   . Arthritis Father   . Diabetes Father   . Drug abuse Brother   . Learning disabilities Brother      Prior to Admission medications   Medication Sig Start Date End Date Taking? Authorizing Provider  albuterol (PROVENTIL HFA;VENTOLIN HFA) 108 (90 Base) MCG/ACT inhaler Inhale 1-2 puffs into the lungs every 6 (six) hours as needed for wheezing. Patient not taking: Reported on 10/21/2015 09/12/15   Tanna Furry, MD  Blood Glucose Monitoring Suppl (BLOOD GLUCOSE MONITOR KIT) KIT Blood Glucose Monitor- check FSBS 1x daily. Lancets &Test strips #50. DX:250.00 Patient not taking: Reported on 08/01/2015 10/01/13   Alycia Rossetti,  MD  diltiazem (CARDIZEM CD) 180 MG 24 hr capsule Take 1 capsule (180 mg total) by mouth daily. Patient not taking: Reported on 10/21/2015 09/12/15   Tanna Furry, MD  doxazosin (CARDURA) 4 MG tablet Take 1 tablet (4 mg total) by mouth daily. Patient not taking: Reported on 10/21/2015 09/12/15   Tanna Furry, MD  simvastatin (ZOCOR) 40 MG tablet Take 1 tablet (40 mg total) by mouth daily. Patient not taking: Reported on 10/21/2015 09/12/15   Tanna Furry, MD  spironolactone (ALDACTONE) 100 MG tablet Take 1  tablet (100 mg total) by mouth 2 (two) times daily. Patient not taking: Reported on 10/21/2015 09/12/15   Tanna Furry, MD  sulfamethoxazole-trimethoprim (BACTRIM DS) 800-160 MG per tablet Take 1 tablet by mouth 2 (two) times daily. Patient not taking: Reported on 08/01/2015 09/27/13   Alycia Rossetti, MD  tamsulosin (FLOMAX) 0.4 MG CAPS capsule Take 1 capsule (0.4 mg total) by mouth daily. Patient not taking: Reported on 10/21/2015 09/12/15   Tanna Furry, MD  torsemide (DEMADEX) 20 MG tablet Take 1 tablet (20 mg total) by mouth 2 (two) times daily. Patient not taking: Reported on 10/21/2015 09/12/15   Tanna Furry, MD  warfarin (COUMADIN) 5 MG tablet Take 1 tablet (5 mg total) by mouth daily. Patient not taking: Reported on 10/21/2015 09/12/15   Tanna Furry, MD    Physical Exam: Filed Vitals:   10/21/15 0330 10/21/15 0345 10/21/15 0430 10/21/15 0500  BP: 119/104 116/98    Pulse: 65 76  49  Temp:   99 F (37.2 C)   TempSrc:   Axillary   Resp: '25 26  20  ' Height:   '6\' 2"'  (1.88 m)   Weight:   149.233 kg (329 lb)   SpO2: 95% 95%  98%   General: In mild respiratory distress with tachypnea, accessory muscle recruitment  HEENT:       Eyes: PERRL, EOMI, no scleral icterus or conjunctival pallor.       ENT: No discharge from the ears or nose, no pharyngeal ulcers.        Neck: No JVD, no bruit, no appreciable mass Heme: No cervical adenopathy, no pallor Cardiac: Rate ~120 and irregular with grade IV holosystolic murmur throughout precordium. Pulm: Good air movement bilaterally. Rales b/l. Abd: Soft, nondistended, nontender, no rebound pain or gaurding, BS present. Ext: Bilateral LEs edematous. Hyperpigmentation of b/l LEs in gaiter distribution with superficial crusted ulcerations Musculoskeletal: No gross deformity, no red, hot, swollen joints  Skin: No rashes or wounds on exposed surfaces  Neuro: Lethargic, arousable but not verbally responsive, no gross facial asymmetry, moving all extrems, normal tone,  PERRL Psych: Patient is not overtly psychotic, though difficult to assess given the clinical scenario.  Labs on Admission:  Basic Metabolic Panel:  Recent Labs Lab 10/21/15 0010  NA 136  K 4.0  CL 100*  CO2 23  GLUCOSE 206*  BUN 51*  CREATININE 1.41*  CALCIUM 8.6*   Liver Function Tests:  Recent Labs Lab 10/21/15 0010  AST 55*  ALT 39  ALKPHOS 161*  BILITOT 2.2*  PROT 6.3*  ALBUMIN 2.4*   No results for input(s): LIPASE, AMYLASE in the last 168 hours. No results for input(s): AMMONIA in the last 168 hours. CBC:  Recent Labs Lab 10/21/15 0010  WBC 24.2*  HGB 15.1  HCT 44.7  MCV 89.9  PLT 97*   Cardiac Enzymes: No results for input(s): CKTOTAL, CKMB, CKMBINDEX, TROPONINI in the last 168 hours.  BNP (last  3 results)  Recent Labs  09/12/15 2056 10/21/15 0010  BNP 137.5* 453.5*    ProBNP (last 3 results) No results for input(s): PROBNP in the last 8760 hours.  CBG: No results for input(s): GLUCAP in the last 168 hours.  Radiological Exams on Admission: Dg Chest Portable 1 View  10/21/2015  CLINICAL DATA:  Acute onset of shortness of breath, generalized weakness and altered mental status. Initial encounter. EXAM: PORTABLE CHEST 1 VIEW COMPARISON:  Chest radiograph performed 09/12/2015 FINDINGS: The lungs are well-aerated. Vascular congestion is noted. Increased interstitial markings may reflect mild interstitial edema. Mild right basilar pneumonia cannot be excluded. No pleural effusion or pneumothorax is seen. The cardiomediastinal silhouette is enlarged. No acute osseous abnormalities are seen. IMPRESSION: Vascular congestion and cardiomegaly. Increased interstitial markings may reflect mild interstitial edema. Mild right basilar pneumonia cannot be excluded. Electronically Signed   By: Garald Balding M.D.   On: 10/21/2015 00:20    EKG: Independently reviewed.  Abnormal findings:   Atrial fibrillation with RVR, LaFB   Assessment/Plan  1. Sepsis  suspected secondary to CAP  - Meets sepsis criteria on admission with leukocytosis, tachypnea, tachycardia, suspected PNA +/- UTI (UA remains pending)  - Treated with empiric Rocephin and azithromycin in ED, will continue  - Trend lactate, procalcitonin  - Check urine for strep pneumo antigens  - Blood cultures incubating; urine culture requested   2. Acute decompensated CHF  - Presents with peripheral edema, vascular congestion on CXR, elevated BNP  - Lasix 80 mg IV x1 given in ED with sluggish response  - SLIV, daily wts, strict I/Os, fluid-restrict diet  - Diurese with Lasix  - Check TTE   3. Atrial fibrillation with RVR  - CHADS-VASc is 71 (age x2, CHF, HTN)  - Prescribed coumadin, but does not take  - Considered heparin gtt, but holding for now given thrombocytopenia  - RVR present on arrival, did not respond to diltiazem bolus x2 and diltiazem infusion; will give a dose of digoxin as HR remains in 120-140 range   4. AKI - SCr 1.41 on admission, up from apparent baseline of 0.87 - Suspect this is secondary to acute infection and volume overload - Anticipate improvement with treatment of the infectious process, controlling HR, and correcting vol o/l  - May worsen initially with diuresis    5. Thrombocytopenia  - Platelet count 97,000 on admission  - Suspected secondary to sepsis  - Anticipate improvement with treatment of infectious process    6. Hypertension - BP has been soft so far  - Will treat cautiously prn     DVT ppx:  Heparin infusion   Code Status: Full code Family Communication: None at bed side.              Disposition Plan: Admit to inpatient   Date of Service 10/21/2015    Vianne Bulls, MD [facility] Pager [phone]  If 7PM-7AM, please contact night-coverage [email or web address] Password [facility] 10/21/2015, 6:19 AM

## 2015-10-21 NOTE — ED Provider Notes (Signed)
CSN: [id number]     Arrival date & time 10/20/15  2359 History  By signing my name below, I, Arianna Nassar, attest that this documentation has been prepared under the direction and in the presence of Jola Schmidt, MD. Electronically Signed: Julien Nordmann, ED Scribe. 10/21/2015. 12:29 AM.    Chief Complaint  Patient presents with  . Shortness of Breath    LEVEL 5 CAVEAT DUE TO RESPIRATORY DISTRESS   The history is provided by the EMS personnel. No language interpreter was used.   HPI Comments: Leonard Montoya is a 75 y.o. male brought in by ambulance, who has a PMHx of CHF, COPD, HTN, HLD, A-fib, and BPH presents to the Emergency Department complaining of sudden onset shortness of breath with associated altered mental status onset PTA. According to EMS, pt's daughter found in his home laying down with extreme foul odor noted. They state pt has been very weak and lethargic. He is non-compliant with his medications. Pt received 2 duonebs and 10 albuterol en route which gave him relief. Pt is incontinent. EMS states pt's HR was fluctuating between 140-160. Pt wears 3L min/[place] at baseline while at home.  Past Medical History  Diagnosis Date  . Allergy 1978    rag weed, dust, animal dander, pollen  . Asthma   . Obesity 1974  . Gout   . Atrial fibrillation ([place])   . Arthritis   . Hypertension   . Hyperlipidemia   . Chronic insomnia   . BPH (benign prostatic hypertrophy)    No past surgical history on file. Family History  Problem Relation Age of Onset  . Arthritis Mother   . Alcohol abuse Father   . Arthritis Father   . Diabetes Father   . Drug abuse Brother   . Learning disabilities Brother    Social History  Substance Use Topics  . Smoking status: Former Smoker    Quit date: 07/23/1991  . Smokeless tobacco: Never Used  . Alcohol Use: No    Review of Systems  A complete 10 system review of systems was obtained and all systems are negative except as noted in the HPI and PMH.     Allergies  Sudafed  Home Medications   Prior to Admission medications   Medication Sig Start Date End Date Taking? Authorizing Provider  albuterol (PROVENTIL HFA;VENTOLIN HFA) 108 (90 Base) MCG/ACT inhaler Inhale 1-2 puffs into the lungs every 6 (six) hours as needed for wheezing. 09/12/15   Tanna Furry, MD  Blood Glucose Monitoring Suppl (BLOOD GLUCOSE MONITOR KIT) KIT Blood Glucose Monitor- check FSBS 1x daily. Lancets &Test strips #50. DX:250.00 Patient not taking: Reported on 08/01/2015 10/01/13   Alycia Rossetti, MD  diltiazem (CARDIZEM CD) 180 MG 24 hr capsule Take 1 capsule (180 mg total) by mouth daily. 09/12/15   Tanna Furry, MD  doxazosin (CARDURA) 4 MG tablet Take 1 tablet (4 mg total) by mouth daily. 09/12/15   Tanna Furry, MD  simvastatin (ZOCOR) 40 MG tablet Take 1 tablet (40 mg total) by mouth daily. 09/12/15   Tanna Furry, MD  spironolactone (ALDACTONE) 100 MG tablet Take 1 tablet (100 mg total) by mouth 2 (two) times daily. 09/12/15   Tanna Furry, MD  sulfamethoxazole-trimethoprim (BACTRIM DS) 800-160 MG per tablet Take 1 tablet by mouth 2 (two) times daily. Patient not taking: Reported on 08/01/2015 09/27/13   Alycia Rossetti, MD  tamsulosin (FLOMAX) 0.4 MG CAPS capsule Take 1 capsule (0.4 mg total) by mouth daily. 09/12/15  Tanna Furry, MD  torsemide (DEMADEX) 20 MG tablet Take 1 tablet (20 mg total) by mouth 2 (two) times daily. 09/12/15   Tanna Furry, MD  warfarin (COUMADIN) 5 MG tablet Take 1 tablet (5 mg total) by mouth daily. 09/12/15   Tanna Furry, MD   Vitals: BP 105/77 mmHg  Pulse 160  Temp(Src) 99.7 F (37.6 C) (Rectal)  Resp 12  SpO2 97% Physical Exam  Constitutional: He is oriented to person, place, and time. He appears well-developed and well-nourished.  Morbidly obese  HENT:  Head: Normocephalic and atraumatic.  Eyes: EOM are normal.  Neck: Normal range of motion.  Cardiovascular: Normal heart sounds and intact distal pulses.  An irregularly irregular rhythm  present. Tachycardia present.   Pulmonary/Chest: He is in respiratory distress. He has wheezes. He has rales.  Tachypnea, rales and wheezing bilaterally  Abdominal: Soft. He exhibits no distension. There is no tenderness.  Musculoskeletal: Normal range of motion. He exhibits edema.  2+ pitting edema bilaterally  Neurological: He is alert and oriented to person, place, and time.  Skin: Skin is warm and dry.  Psychiatric: He has a normal mood and affect. Judgment normal.  Nursing note and vitals reviewed.  ED Course  Procedures   CRITICAL CARE Performed by: Hoy Morn Total critical care time: 35 minutes Critical care time was exclusive of separately billable procedures and treating other patients. Critical care was necessary to treat or prevent imminent or life-threatening deterioration. Critical care was time spent personally by me on the following activities: development of treatment plan with patient and/or surrogate as well as nursing, discussions with consultants, evaluation of patient's response to treatment, examination of patient, obtaining history from patient or surrogate, ordering and performing treatments and interventions, ordering and review of laboratory studies, ordering and review of radiographic studies, pulse oximetry and re-evaluation of patient's condition.    DIAGNOSTIC STUDIES: Oxygen Saturation is 97% on RA, normal by my interpretation.  COORDINATION OF CARE:  12:29 AM Discussed treatment plan with pt at bedside and pt agreed to plan.  Labs Review Labs Reviewed  BASIC METABOLIC PANEL - Abnormal; Notable for the following:    Chloride 100 (*)    Glucose, Bld 206 (*)    BUN 51 (*)    Creatinine, Ser 1.41 (*)    Calcium 8.6 (*)    GFR calc non Af Amer 47 (*)    GFR calc Af Amer 54 (*)    All other components within normal limits  CBC - Abnormal; Notable for the following:    WBC 24.2 (*)    Platelets 97 (*)    All other components within normal  limits  BRAIN NATRIURETIC PEPTIDE - Abnormal; Notable for the following:    B Natriuretic Peptide 453.5 (*)    All other components within normal limits  HEPATIC FUNCTION PANEL - Abnormal; Notable for the following:    Total Protein 6.3 (*)    Albumin 2.4 (*)    AST 55 (*)    Alkaline Phosphatase 161 (*)    Total Bilirubin 2.2 (*)    Bilirubin, Direct 0.9 (*)    Indirect Bilirubin 1.3 (*)    All other components within normal limits  PROTIME-INR - Abnormal; Notable for the following:    Prothrombin Time 16.2 (*)    All other components within normal limits  I-STAT TROPOININ, ED - Abnormal; Notable for the following:    Troponin i, poc 0.16 (*)    All other components within normal  limits  I-STAT ARTERIAL BLOOD GAS, ED - Abnormal; Notable for the following:    pO2, Arterial 74.0 (*)    All other components within normal limits   BUN  Date Value Ref Range Status  10/21/2015 51* 6 - 20 mg/dL Final  09/12/2015 14 6 - 20 mg/dL Final  09/27/2013 29* 6 - 23 mg/dL Final   CREAT  Date Value Ref Range Status  09/27/2013 1.49* 0.50 - 1.35 mg/dL Final   CREATININE, SER  Date Value Ref Range Status  10/21/2015 1.41* 0.61 - 1.24 mg/dL Final  09/12/2015 0.87 0.61 - 1.24 mg/dL Final       Imaging Review Dg Chest Portable 1 View  10/21/2015  CLINICAL DATA:  Acute onset of shortness of breath, generalized weakness and altered mental status. Initial encounter. EXAM: PORTABLE CHEST 1 VIEW COMPARISON:  Chest radiograph performed 09/12/2015 FINDINGS: The lungs are well-aerated. Vascular congestion is noted. Increased interstitial markings may reflect mild interstitial edema. Mild right basilar pneumonia cannot be excluded. No pleural effusion or pneumothorax is seen. The cardiomediastinal silhouette is enlarged. No acute osseous abnormalities are seen. IMPRESSION: Vascular congestion and cardiomegaly. Increased interstitial markings may reflect mild interstitial edema. Mild right basilar  pneumonia cannot be excluded. Electronically Signed   By: Garald Balding M.D.   On: 10/21/2015 00:20   I have personally reviewed and evaluated these images and lab results as part of my medical decision-making.   EKG Interpretation   Date/Time:  Saturday October 21 2015 00:15:01 EDT Ventricular Rate:  167 PR Interval:    QRS Duration: 103 QT Interval:  300 QTC Calculation: 500 R Axis:   -53 Text Interpretation:  Atrial fibrillation with rapid V-rate Left anterior  fascicular block Left ventricular hypertrophy No significant change was  found Confirmed by Kawthar Ennen  MD, Hugo Lybrand ([id number]) on 10/21/2015 12:45:38 AM      MDM   Final diagnoses:  Atrial fibrillation with rapid ventricular response (HCC)  Hypoxia  Acute congestive heart failure, unspecified congestive heart failure type ([place])  AKI (acute kidney injury) ([place])   Patient with significant medical noncompliance.  Rectal temp of 99.7.  Elevated white blood cell count.  Possible developing infiltrate.  Patient cover with Rocephin and azithromycin.  Also presents in atrial fibrillation with rapid ventricular response.  Attempting to rate control with IV Cardizem at this time.  He's required titration of his Cardizem drip as well as repeat boluses.  IV Lasix given for vascular congestion and mild edema.  Acute kidney injury likely output related.  Patient and family updated.   I personally performed the services described in this documentation, which was scribed in my presence. The recorded information has been reviewed and is accurate.      Jola Schmidt, MD 10/21/15 [phone]

## 2015-10-21 NOTE — ED Notes (Signed)
Condom cath placed on patient. Per family patient incontinent  Of urine and will not notify when he has to urinate.

## 2015-10-21 NOTE — Progress Notes (Addendum)
ANTICOAGULATION CONSULT NOTE - Initial Consult  Pharmacy Consult for Heparin  Indication: atrial fibrillation  Allergies  Allergen Reactions  . Sudafed [Pseudoephedrine Hcl]     Weird Dreams    Patient Measurements: Height: 6\' 2"  (188 cm) Weight: (!) 329 lb (149.233 kg) IBW/kg (Calculated) : 82.2  Vital Signs: Temp: 99 F (37.2 C) (04/01 0430) Temp Source: Axillary (04/01 0430) BP: 116/98 mmHg (04/01 0345) Pulse Rate: 49 (04/01 0500)  Labs:  Recent Labs  10/21/15 0010  HGB 15.1  HCT 44.7  PLT 97*  LABPROT 16.2*  INR 1.29  CREATININE 1.41*    Estimated Creatinine Clearance: 68.7 mL/min (by C-G formula based on Cr of 1.41).   Medical History: Past Medical History  Diagnosis Date  . Allergy 1978    rag weed, dust, animal dander, pollen  . Asthma   . Obesity 1974  . Gout   . Atrial fibrillation ([facility])   . Arthritis   . Hypertension   . Hyperlipidemia   . Chronic insomnia   . BPH (benign prostatic hypertrophy)     Assessment: Heparin for afib, supposed to be on warfarin PTA but not taking, INR is 1.29, Hgb good, plts low at 97, mild bump in Scr, other labs reviewed.   Goal of Therapy:  Heparin level 0.3-0.7 units/ml Monitor platelets by anticoagulation protocol: Yes   Plan:  -Heparin 3000 units BOLUS (reduced bolus with low plts) -Start heparin drip at 1500 units/hr -1500 HL -Daily CBC/HL -Monitor for bleeding  Abran DukeLedford, Lucile Didonato 10/21/2015,6:26 AM  ==================================== 10/21/2015 6:38 AM MD deferring heparin to day team given low platelets  =====================================

## 2015-10-21 NOTE — ED Notes (Signed)
MD aware patient HR still 145 however BP 105/80 advised to give 10 mg bouls and have drip at 15mg / HR.  [facility] advised okay to do.

## 2015-10-21 NOTE — ED Notes (Signed)
Patient comes from home with complaints of SOB. Per EMS patient was found Altered and weak. On Arrival foul urine noted. Patient is incontient.  Patient wears 3L baseline. Per EMS on arrival patient was not wearing o2. EMS noted Wheezing in all fields. Patient given 10 Albuterol and 0.5 Atrovent. Patient noted to be wheezing on arrival.

## 2015-10-21 NOTE — ED Notes (Signed)
Stage 1 pressure ulcer noted to the right hip.

## 2015-10-21 NOTE — Progress Notes (Signed)
CRITICAL VALUE ALERT  Critical value received:  Lactic acid  Date of notification:  10/21/15  Time of notification:  132  Critical value read back:yes  Nurse who received alert:  Vincent GrosKelsey huber   MD notified (1st page):  Dr. Susie CassetteAbrol  Time of first page:  1330  MD notified (2nd page):  Time of second page:  Responding MD:  Susie CassetteAbrol  Time MD responded:  1332

## 2015-10-21 NOTE — ED Notes (Signed)
Patient has 2+ edema noted bilateral LE.

## 2015-10-21 NOTE — Progress Notes (Addendum)
Seen and examined  75 y.o. male with PMH of hypertension, hyperlipidemia, asthma, and atrial fibrillation who is brought in to the ED after being found down by his daughter. Patient is chronically ill and, unfortunately, has not been adherent to his medical treatment plan. He was found by his daughter to be lying on the floor unresponsive with foul urine odor. He was arousable, but per report, very weak and lethargic. He is prescribed 3 L/m supplemental oxygen around the clock. He was not wearing his oxygen upon EMS arrival. He was found to have wheezes throughout and was given DuoNeb treatment en route to the hospital   1. Sepsis suspected secondary to CAP  - Meets sepsis criteria on admission with leukocytosis, tachypnea, tachycardia, suspected PNA +/- UTI (UA remains pending) , check procalcitonin and lactate ,and  BC  - Treated with empiric Rocephin and azithromycin in ED, will continue  - Check urine for strep pneumo antigens  - Blood cultures incubating; urine culture requested   2. Chronic   CHF , no decompensation  - Presents with peripheral edema, vascular congestion on CXR, elevated BNP  - Lasix 80 mg IV DC , patient has AKI - SLIV, daily wts, strict I/Os, fluid-restrict diet  - Check TTE   3. Atrial fibrillation with RVR  - CHADS-VASc is 404 (age x2, CHF, HTN)  - Prescribed coumadin, but does not take  - Considered heparin gtt, but holding for now given thrombocytopenia  - RVR present on arrival, did not respond to diltiazem bolus x2 and diltiazem infusion; will give a dose of digoxin as HR remains in 120-140 range   4. AKI - SCr 1.41 on admission, up from apparent baseline of 0.87 - Suspect this is secondary to acute infection  - Anticipate improvement with treatment of the infectious process, controlling HR, and correcting vol o/l  - May worsen initially with diuresis   5. Thrombocytopenia  - Platelet count 97,000 on admission  - Suspected secondary to  sepsis  - Anticipate improvement with treatment of infectious process   6. Hypertension - BP has been soft so far  - Will treat cautiously prn   7.Expressive aphasia Patient is awake but unable to follow commands or speak MRI of the brain to rule out CVA in the setting of his atrial fibrillation

## 2015-10-22 ENCOUNTER — Inpatient Hospital Stay (HOSPITAL_COMMUNITY): Payer: Commercial Managed Care - HMO

## 2015-10-22 ENCOUNTER — Other Ambulatory Visit (HOSPITAL_COMMUNITY): Payer: Commercial Managed Care - HMO

## 2015-10-22 DIAGNOSIS — I509 Heart failure, unspecified: Secondary | ICD-10-CM

## 2015-10-22 DIAGNOSIS — I5021 Acute systolic (congestive) heart failure: Secondary | ICD-10-CM

## 2015-10-22 DIAGNOSIS — N179 Acute kidney failure, unspecified: Secondary | ICD-10-CM

## 2015-10-22 LAB — GLUCOSE, CAPILLARY
GLUCOSE-CAPILLARY: 209 mg/dL — AB (ref 65–99)
GLUCOSE-CAPILLARY: 247 mg/dL — AB (ref 65–99)
GLUCOSE-CAPILLARY: 306 mg/dL — AB (ref 65–99)
GLUCOSE-CAPILLARY: 287 mg/dL — AB (ref 65–99)

## 2015-10-22 LAB — BASIC METABOLIC PANEL
Anion gap: 14 (ref 5–15)
BUN: 61 mg/dL — ABNORMAL HIGH (ref 6–20)
CHLORIDE: 99 mmol/L — AB (ref 101–111)
CO2: 20 mmol/L — AB (ref 22–32)
CREATININE: 1.13 mg/dL (ref 0.61–1.24)
Calcium: 8.7 mg/dL — ABNORMAL LOW (ref 8.9–10.3)
GFR calc non Af Amer: >60 mL/min (ref >60)
Glucose, Bld: 278 mg/dL — ABNORMAL HIGH (ref 65–99)
POTASSIUM: 5.7 mmol/L — AB (ref 3.5–5.1)
Sodium: 133 mmol/L — ABNORMAL LOW (ref 135–145)

## 2015-10-22 LAB — CBC
HCT: 43.3 % (ref 39.0–52.0)
HEMOGLOBIN: 14.3 g/dL (ref 13.0–17.0)
MCH: 29.5 pg (ref 26.0–34.0)
MCHC: 33 g/dL (ref 30.0–36.0)
MCV: 89.3 fL (ref 78.0–100.0)
PLATELETS: 105 10*3/uL — AB (ref 150–400)
RBC: 4.85 MIL/uL (ref 4.22–5.81)
RDW: 14.2 % (ref 11.5–15.5)
WBC: 14.4 10*3/uL — AB (ref 4.0–10.5)

## 2015-10-22 LAB — COMPREHENSIVE METABOLIC PANEL
ALT: 32 U/L (ref 17–63)
ANION GAP: 15 (ref 5–15)
AST: 54 U/L — ABNORMAL HIGH (ref 15–41)
Albumin: 2.3 g/dL — ABNORMAL LOW (ref 3.5–5.0)
Alkaline Phosphatase: 148 U/L — ABNORMAL HIGH (ref 38–126)
BUN: 57 mg/dL — ABNORMAL HIGH (ref 6–20)
CALCIUM: 8.5 mg/dL — AB (ref 8.9–10.3)
CHLORIDE: 98 mmol/L — AB (ref 101–111)
CO2: 21 mmol/L — AB (ref 22–32)
CREATININE: 1.03 mg/dL (ref 0.61–1.24)
Glucose, Bld: 213 mg/dL — ABNORMAL HIGH (ref 65–99)
Potassium: 5.7 mmol/L — ABNORMAL HIGH (ref 3.5–5.1)
SODIUM: 134 mmol/L — AB (ref 135–145)
Total Bilirubin: 2.6 mg/dL — ABNORMAL HIGH (ref 0.3–1.2)
Total Protein: 5.8 g/dL — ABNORMAL LOW (ref 6.5–8.1)

## 2015-10-22 LAB — ECHOCARDIOGRAM COMPLETE
Height: 74 in
Weight: 5235.2 oz

## 2015-10-22 LAB — HIV ANTIBODY (ROUTINE TESTING W REFLEX): HIV SCREEN 4TH GENERATION: NONREACTIVE

## 2015-10-22 MED ORDER — SODIUM CHLORIDE 0.9 % IV SOLN
INTRAVENOUS | Status: AC
  Administered 2015-10-22: 1000 mL via INTRAVENOUS

## 2015-10-22 MED ORDER — SODIUM POLYSTYRENE SULFONATE 15 GM/60ML PO SUSP
15.0000 g | Freq: Once | ORAL | Status: AC
  Administered 2015-10-22: 15 g via ORAL
  Filled 2015-10-22: qty 60

## 2015-10-22 MED ORDER — DEXTROSE 5 % IV SOLN
2.0000 g | INTRAVENOUS | Status: DC
  Administered 2015-10-22 – 2015-10-25 (×4): 2 g via INTRAVENOUS
  Filled 2015-10-22 (×5): qty 2

## 2015-10-22 MED ORDER — DILTIAZEM HCL 60 MG PO TABS
60.0000 mg | ORAL_TABLET | Freq: Three times a day (TID) | ORAL | Status: DC
  Administered 2015-10-22 – 2015-10-25 (×10): 60 mg via ORAL
  Filled 2015-10-22 (×10): qty 1

## 2015-10-22 MED ORDER — DEXTROSE 5 % IV SOLN
500.0000 mg | INTRAVENOUS | Status: DC
  Administered 2015-10-22 – 2015-10-25 (×4): 500 mg via INTRAVENOUS
  Filled 2015-10-22 (×5): qty 500

## 2015-10-22 NOTE — Consult Note (Signed)
WOC wound consult note Reason for Consult: Right buttock full thickness tissue loss secondary to shear, moisture. Stage 3 Wound type:Pressure, Shear Pressure Ulcer POA: Yes Measurement: 3cm x 2.5cm x 0.2cm Wound bed: Red, moist Drainage (amount, consistency, odor) Scant serous Periwound: intact, with blanching erythema Dressing procedure/placement/frequency: Topical care and preventive skin POC guidance provided for Nursing today via orders and include floatation of heels, use of the house skin care product line, turning and repositioning, and HOB elevation. A bariatric bed with low air loss feature is provided as despite his weight being in line with standard bed use, his body habitus is such that he has no room on either side when being turned onto his side and this puts him at risk for further skin breakdown on the buttocks. WOC nursing team will not follow, but will remain available to this patient, the nursing and medical teams.  Please re-consult if needed. Thanks, Ladona MowLaurie Lyden Redner, MSN, RN, GNP, Hans EdenCWOCN, CWON-AP, FAAN  Pager# [phone]

## 2015-10-22 NOTE — Progress Notes (Signed)
[facility] PROGRESS NOTE  Leonard Montoya WJX:[id number] DOB: Apr 19, 1941 DOA: 10/20/2015 PCP: No PCP Per Patient  Length of stay: 1   Assessment/Plan: Active Problems:   Essential hypertension, benign   Atrial fibrillation with rapid ventricular response (HCC)   Sepsis (HCC)   AKI (acute kidney injury) (HCC)   Acute CHF (congestive heart failure) (HCC)   Thrombocytopenia (HCC)   CAP (community acquired pneumonia)   Acute congestive heart failure (HCC)     75 y.o. male with PMH of hypertension, hyperlipidemia, asthma, and atrial fibrillation who is brought in to the ED after being found down by his daughter. Patient is chronically ill and, unfortunately, has not been adherent to his medical treatment plan. He was found by his daughter to be lying on the floor unresponsive with foul urine odor. He was arousable, but per report, very weak and lethargic. He is prescribed 3 L/m supplemental oxygen around the clock. He was not wearing his oxygen upon EMS arrival. He was found to have wheezes throughout and was given DuoNeb treatment en route to the hospital  Assessment and plan 1. Sepsis suspected secondary to CAP  - Meets sepsis criteria on admission with leukocytosis, tachypnea, tachycardia, suspected PNA +/- UTI (UA remains pending) , markedly elevated procalcitonin , lactic acid on admission,  - Treated with empiric Rocephin and azithromycin in ED, will continue  Negative urine for strep pneumo antigens  Continue to follow blood culture, urine culture results   2. Chronic CHF , no decompensation /abnormal troponin - Presents with peripheral edema, vascular congestion on CXR, elevated BNP  - Lasix 80 mg IV DC , patient has AKI 2-D echo pending Abnormal troponin likely the setting of demand ischemia, continue to trend cardiac enzymes Holding Demadex, Aldactone,  3. Atrial fibrillation with RVR  - CHADS-VASc is 4 (age x2, CHF, HTN) , on Cardizem CD at home -  Prescribed coumadin, but does not take  - Considered heparin gtt, but holding for now given thrombocytopenia  - RVR present on arrival, initiated on diltiazem infusion; now transition to by mouth Cardizem Also receiving IV digoxin  4. AKI - SCr 1.41 on admission, up from apparent baseline of 0.87 - Suspect this is secondary to acute infection  Continue to hold diuretics and continues IV fluids for now Kayexalate for hyperkalemia  5. Thrombocytopenia  - Platelet count 97,000 improving, likely secondary to sepsis - Anticipate improvement with treatment of infectious process   6. 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 Speech therapy evaluation pending  DVT prophylaxsis lovenox   Code Status:      Code Status Orders     DNR   Start     Ordered     Family Communication:  Attempted to reach the patient's daughter, left voicemail  to call back Gretchen Short Daughter [phone]    Disposition Plan:  Will likely need placement      Consultants:  None  Procedures:  None  Antibiotics: Anti-infectives    Start     Dose/Rate Route Frequency Ordered Stop   10/21/15 0215  cefTRIAXone (ROCEPHIN) 1 g in dextrose  5 % 50 mL IVPB     1 g 100 mL/hr over 30 Minutes Intravenous  Once 10/21/15 0206 10/21/15 0339   10/21/15 0215  azithromycin (ZITHROMAX) 500 mg in dextrose 5 % 250 mL IVPB     500 mg 250 mL/hr over 60 Minutes Intravenous  Once 10/21/15 0206           HPI/Subjective: More awake , no cp, no sob   Objective: Filed Vitals:   10/21/15 2040 10/22/15 0029 10/22/15 0200 10/22/15 0400  BP: 129/77 137/95 142/79  129/77  Pulse:  109 68   Temp:  97.8 F (36.6 C)  97.1 F (36.2 C)  TempSrc:    Oral  Resp:  19 13   Height:      Weight:    148.417 kg (327 lb 3.2 oz)  SpO2: 96% 98% 93% 98%    Intake/Output Summary (Last 24 hours) at 10/22/15 0939 Last data filed at 10/22/15 0400  Gross per 24 hour  Intake 1724.25 ml  Output   1000 ml  Net 724.25 ml    Exam:  Cardiac: Rate ~120 and irregular with grade IV holosystolic murmur throughout precordium. Pulm: Good air movement bilaterally. Rales b/l. Abd: Soft, nondistended, nontender, no rebound pain or gaurding, BS present. Ext: Bilateral LEs edematous. Hyperpigmentation of b/l LEs in gaiter distribution with superficial crusted ulcerations Musculoskeletal: No gross deformity, no red, hot, swollen joints  Skin: No rashes or wounds on exposed surfaces  Neuro: Lethargic, arousable but not verbally responsive    Data Review   Micro Results Recent Results (from the past 240 hour(s))  MRSA PCR Screening     Status: None   Collection Time: 10/21/15  4:34 AM  Result Value Ref Range Status   MRSA by PCR NEGATIVE NEGATIVE Final    Comment:        The GeneXpert MRSA Assay (FDA approved for NASAL specimens only), is one component of a comprehensive MRSA colonization surveillance program. It is not intended to diagnose MRSA infection nor to guide or monitor treatment for MRSA infections.     Radiology Reports Ct Head Wo Contrast  10/21/2015  CLINICAL DATA:  Found down. Past medical history of hypertension, asthma and atrial fibrillation. EXAM: CT HEAD WITHOUT CONTRAST TECHNIQUE: Contiguous axial images were obtained from the base of the skull through the vertex without intravenous contrast. COMPARISON:  None. FINDINGS: Brain: There is generalized brain atrophy with commensurate dilatation of the sulci. Moderate ventriculomegaly is slightly out of proportion to the degree of brain atrophy. There is no mass, hemorrhage, edema or other evidence  of acute parenchymal abnormality. No extra-axial hemorrhage. Vascular: No hyperdense vessel or unexpected calcification. Skull: Negative for fracture or focal lesion. Sinuses/Orbits: No acute findings. Other: None. IMPRESSION: 1. No acute findings. No intracranial mass, hemorrhage or edema. No skull fracture. 2. Moderate ventriculomegaly which is slightly out of proportion to the degree of brain atrophy raising the possibility of a chronic normal pressure hydrocephalus. Electronically Signed   By: Bary Richard M.D.   On: 10/21/2015 21:44   Dg Chest Portable 1 View  10/21/2015  CLINICAL DATA:  Acute onset of shortness of breath, generalized weakness and altered mental status. Initial encounter. EXAM: PORTABLE CHEST 1 VIEW COMPARISON:  Chest radiograph performed 09/12/2015 FINDINGS: The lungs are well-aerated. Vascular congestion is noted. Increased interstitial markings may reflect mild interstitial edema. Mild right basilar pneumonia cannot be excluded. No pleural effusion or pneumothorax is seen. The cardiomediastinal silhouette is enlarged. No acute  osseous abnormalities are seen. IMPRESSION: Vascular congestion and cardiomegaly. Increased interstitial markings may reflect mild interstitial edema. Mild right basilar pneumonia cannot be excluded. Electronically Signed   By: Roanna Raider M.D.   On: 10/21/2015 00:20     CBC  Recent Labs Lab 10/21/15 0010 10/22/15 0546  WBC 24.2* 14.4*  HGB 15.1 14.3  HCT 44.7 43.3  PLT 97* 105*  MCV 89.9 89.3  MCH 30.4 29.5  MCHC 33.8 33.0  RDW 13.8 14.2    Chemistries   Recent Labs Lab 10/21/15 0010 10/21/15 1159 10/22/15 0546  NA 136 135 134*  K 4.0 3.3* 5.7*  CL 100* 99* 98*  CO2 23 21* 21*  GLUCOSE 206* 220* 213*  BUN 51* 58* 57*  CREATININE 1.41* 1.51* 1.03  CALCIUM 8.6* 8.5* 8.5*  AST 55*  --  54*  ALT 39  --  32  ALKPHOS 161*  --  148*  BILITOT 2.2*  --  2.6*    ------------------------------------------------------------------------------------------------------------------ estimated creatinine clearance is 93.8 mL/min (by C-G formula based on Cr of 1.03). ------------------------------------------------------------------------------------------------------------------ No results for input(s): HGBA1C in the last 72 hours. ------------------------------------------------------------------------------------------------------------------ No results for input(s): CHOL, HDL, LDLCALC, TRIG, CHOLHDL, LDLDIRECT in the last 72 hours. ------------------------------------------------------------------------------------------------------------------ No results for input(s): TSH, T4TOTAL, T3FREE, THYROIDAB in the last 72 hours.  Invalid input(s): FREET3 ------------------------------------------------------------------------------------------------------------------ No results for input(s): VITAMINB12, FOLATE, FERRITIN, TIBC, IRON, RETICCTPCT in the last 72 hours.  Coagulation profile  Recent Labs Lab 10/21/15 0010  INR 1.29    No results for input(s): DDIMER in the last 72 hours.  Cardiac Enzymes  Recent Labs Lab 10/21/15 0723 10/21/15 1159 10/21/15 1900  TROPONINI 0.48* 0.13* 0.33*   ------------------------------------------------------------------------------------------------------------------ Invalid input(s): POCBNP   CBG:  Recent Labs Lab 10/21/15 0826 10/21/15 1135 10/21/15 1618 10/21/15 2139 10/22/15 0748  GLUCAP 187* 211* 198* 233* 209*       Studies: Ct Head Wo Contrast  10/21/2015  CLINICAL DATA:  Found down. Past medical history of hypertension, asthma and atrial fibrillation. EXAM: CT HEAD WITHOUT CONTRAST TECHNIQUE: Contiguous axial images were obtained from the base of the skull through the vertex without intravenous contrast. COMPARISON:  None. FINDINGS: Brain: There is generalized brain atrophy with commensurate  dilatation of the sulci. Moderate ventriculomegaly is slightly out of proportion to the degree of brain atrophy. There is no mass, hemorrhage, edema or other evidence of acute parenchymal abnormality. No extra-axial hemorrhage. Vascular: No hyperdense vessel or unexpected calcification. Skull: Negative for fracture or focal lesion. Sinuses/Orbits: No acute findings. Other: None. IMPRESSION: 1. No acute findings. No intracranial mass, hemorrhage or edema. No skull fracture. 2. Moderate ventriculomegaly which is slightly out of proportion to the degree of brain atrophy raising the possibility of a chronic normal pressure hydrocephalus. Electronically Signed   By: Bary Richard M.D.   On: 10/21/2015 21:44   Dg Chest Portable 1 View  10/21/2015  CLINICAL DATA:  Acute onset of shortness of breath, generalized weakness and altered mental status. Initial encounter. EXAM: PORTABLE CHEST 1 VIEW COMPARISON:  Chest radiograph performed 09/12/2015 FINDINGS: The lungs are well-aerated. Vascular congestion is noted. Increased interstitial markings may reflect mild interstitial edema. Mild right basilar pneumonia cannot be excluded. No pleural effusion or pneumothorax is seen. The cardiomediastinal silhouette is enlarged. No acute osseous abnormalities are seen. IMPRESSION: Vascular congestion and cardiomegaly. Increased interstitial markings may reflect mild interstitial edema. Mild right basilar pneumonia cannot be excluded. Electronically Signed   By: Roanna Raider M.D.   On: 10/21/2015  00:20      Lab Results  Component Value Date   HGBA1C 6.8* 09/27/2013   Lab Results  Component Value Date   LDLCALC 85 09/27/2013   CREATININE 1.03 10/22/2015       Scheduled Meds: . antiseptic oral rinse  7 mL Mouth Rinse BID  . aspirin EC  81 mg Oral Daily  . azithromycin (ZITHROMAX) 500 MG IVPB  500 mg Intravenous Once  . digoxin  0.125 mg Intravenous Daily  . enoxaparin (LOVENOX) injection  40 mg Subcutaneous Q24H   . insulin aspart  0-15 Units Subcutaneous TID WC  . methylPREDNISolone (SOLU-MEDROL) injection  40 mg Intravenous Q12H  . simvastatin  40 mg Oral Daily  . sodium chloride flush  3 mL Intravenous Q12H  . sodium polystyrene  15 g Oral Once  . tamsulosin  0.4 mg Oral Daily   Continuous Infusions: . diltiazem (CARDIZEM) infusion 15 mg/hr (10/22/15 [id number])    Active Problems:   Essential hypertension, benign   Atrial fibrillation with rapid ventricular response (HCC)   Sepsis (HCC)   AKI (acute kidney injury) (HCC)   Acute CHF (congestive heart failure) (HCC)   Thrombocytopenia (HCC)   CAP (community acquired pneumonia)   Acute congestive heart failure (HCC)    Time spent: 45 minutes   [facility]ard Memorial HospitalBROL,Marisal Swarey  [facility] Pager [phone]. If 7PM-7AM, please contact night-coverage at [email or web address], password [email or web address] 10/22/2015, 9:39 AM  LOS: 1 day

## 2015-10-22 NOTE — Progress Notes (Signed)
  Echocardiogram 2D Echocardiogram has been performed.  Arvil ChacoFoster, Marylynne Keelin 10/22/2015, 12:24 PM

## 2015-10-23 ENCOUNTER — Telehealth: Payer: Self-pay | Admitting: *Deleted

## 2015-10-23 ENCOUNTER — Inpatient Hospital Stay (HOSPITAL_COMMUNITY): Payer: Commercial Managed Care - HMO

## 2015-10-23 DIAGNOSIS — L8990 Pressure ulcer of unspecified site, unspecified stage: Secondary | ICD-10-CM | POA: Insufficient documentation

## 2015-10-23 LAB — URINE CULTURE: Culture: 50000

## 2015-10-23 LAB — TSH: TSH: 0.662 u[IU]/mL (ref 0.350–4.500)

## 2015-10-23 LAB — BASIC METABOLIC PANEL
Anion gap: 11 (ref 5–15)
BUN: 56 mg/dL — ABNORMAL HIGH (ref 6–20)
CHLORIDE: 100 mmol/L — AB (ref 101–111)
CO2: 26 mmol/L (ref 22–32)
CREATININE: 1.03 mg/dL (ref 0.61–1.24)
Calcium: 8.8 mg/dL — ABNORMAL LOW (ref 8.9–10.3)
GFR calc non Af Amer: >60 mL/min (ref >60)
Glucose, Bld: 202 mg/dL — ABNORMAL HIGH (ref 65–99)
POTASSIUM: 3.6 mmol/L (ref 3.5–5.1)
SODIUM: 137 mmol/L (ref 135–145)

## 2015-10-23 LAB — HEPATIC FUNCTION PANEL
ALBUMIN: 2.3 g/dL — AB (ref 3.5–5.0)
ALT: 54 U/L (ref 17–63)
AST: 68 U/L — AB (ref 15–41)
Alkaline Phosphatase: 131 U/L — ABNORMAL HIGH (ref 38–126)
BILIRUBIN DIRECT: 0.3 mg/dL (ref 0.1–0.5)
Indirect Bilirubin: 0.6 mg/dL (ref 0.3–0.9)
TOTAL PROTEIN: 5.9 g/dL — AB (ref 6.5–8.1)
Total Bilirubin: 0.9 mg/dL (ref 0.3–1.2)

## 2015-10-23 LAB — GLUCOSE, CAPILLARY
GLUCOSE-CAPILLARY: 204 mg/dL — AB (ref 65–99)
GLUCOSE-CAPILLARY: 200 mg/dL — AB (ref 65–99)
GLUCOSE-CAPILLARY: 296 mg/dL — AB (ref 65–99)
Glucose-Capillary: 268 mg/dL — ABNORMAL HIGH (ref 65–99)

## 2015-10-23 LAB — HEMOGLOBIN A1C
Hgb A1c MFr Bld: 6 % — ABNORMAL HIGH (ref 4.8–5.6)
Mean Plasma Glucose: 126 mg/dL

## 2015-10-23 LAB — VITAMIN B12: VITAMIN B 12: 799 pg/mL (ref 180–914)

## 2015-10-23 LAB — PROCALCITONIN: PROCALCITONIN: 2.75 ng/mL

## 2015-10-23 MED ORDER — FUROSEMIDE 40 MG PO TABS
40.0000 mg | ORAL_TABLET | Freq: Two times a day (BID) | ORAL | Status: DC
  Administered 2015-10-23 – 2015-10-25 (×6): 40 mg via ORAL
  Filled 2015-10-23 (×6): qty 1

## 2015-10-23 MED ORDER — ENOXAPARIN SODIUM 60 MG/0.6ML ~~LOC~~ SOLN
60.0000 mg | SUBCUTANEOUS | Status: DC
  Administered 2015-10-23 – 2015-10-25 (×3): 60 mg via SUBCUTANEOUS
  Filled 2015-10-23 (×3): qty 0.6

## 2015-10-23 MED ORDER — ENOXAPARIN SODIUM 40 MG/0.4ML ~~LOC~~ SOLN: 40.0000 mg | SUBCUTANEOUS | Status: DC

## 2015-10-23 MED ORDER — APIXABAN 5 MG PO TABS: 5.0000 mg | ORAL_TABLET | Freq: Two times a day (BID) | ORAL | Status: DC

## 2015-10-23 MED ORDER — PREDNISONE 20 MG PO TABS
40.0000 mg | ORAL_TABLET | Freq: Every day | ORAL | Status: DC
  Administered 2015-10-24 – 2015-10-25 (×2): 40 mg via ORAL
  Filled 2015-10-23 (×2): qty 2

## 2015-10-23 NOTE — Evaluation (Signed)
Speech Language Pathology Evaluation Patient Details Name: Leonard Montoya MRN: [id number] DOB: [birth year] Today's Date: 10/23/2015 Time: 7829-5621 SLP Time Calculation (min) (ACUTE ONLY): 27 min  Problem List:  Patient Active Problem List   Diagnosis Date Noted  . Pressure ulcer 10/23/2015  . Acute congestive heart failure (HCC)   . Atrial fibrillation with rapid ventricular response (HCC) 10/21/2015  . Sepsis (HCC) 10/21/2015  . AKI (acute kidney injury) (HCC) 10/21/2015  . Acute CHF (congestive heart failure) (HCC) 10/21/2015  . Thrombocytopenia (HCC) 10/21/2015  . CAP (community acquired pneumonia) 10/21/2015  . Gait instability 10/18/2013  . At high risk for falls 09/28/2013  . Chronic insomnia 09/28/2013  . Essential hypertension, benign 09/27/2013  . Atrial fibrillation (HCC) 09/27/2013  . Morbid obesity (HCC) 09/27/2013  . Omphalitis in adult 09/27/2013  . Asthma, chronic 09/27/2013  . Venous stasis dermatitis 09/27/2013  . Other and unspecified hyperlipidemia 09/27/2013   Past Medical History:  Past Medical History  Diagnosis Date  . Allergy 1978    rag weed, dust, animal dander, pollen  . Asthma   . Obesity 1974  . Gout   . Atrial fibrillation (HCC)   . Arthritis   . Hypertension   . Hyperlipidemia   . Chronic insomnia   . BPH (benign prostatic hypertrophy)    Past Surgical History: History reviewed. No pertinent past surgical history. HPI:  75 y.o. male with PMH of hypertension, hyperlipidemia, asthma, and atrial fibrillation who is brought in to the ED after being found down by his daughter. Patient is chronically ill and, unfortunately, has not been adherent to his medical treatment plan. He was found by his daughter to be lying on the floor unresponsive with foul urine odor. He was arousable, but per report, very weak and lethargic. He is prescribed 3 L/m supplemental oxygen around the clock. He was not wearing his oxygen upon EMS arrival. Pt has been awake but  unable to follow commands. MRI not completed, but CT negative.    Assessment / Plan / Recommendation Clinical Impression  Pt demonstrated adequate speech intelligiblity and language function. He is able to participate at conversation level without error or difficulty. He does demonstrate poor attention initially, though this improved during session by engaging pts regarding his interest ( a cat he used to have). Short term memory persistently impaired; Pt was unable to provide orientation other than to self. SLP provided teaching as to importance of engaging with questioners and attempting to learn new information for self care. Utilized verbal cueing to direct attention to environmental cues, which pt used to state date with assist. Will continue efforts acutely to improve cognitive function.     SLP Assessment  Patient needs continued Speech Lanaguage Pathology Services    Follow Up Recommendations  24 hour supervision/assistance;Skilled Nursing facility    Frequency and Duration min 2x/week  2 weeks      SLP Evaluation Prior Functioning  Cognitive/Linguistic Baseline: Information not available  Lives With: Alone (per pt)   Cognition  Overall Cognitive Status: Impaired/Different from baseline Arousal/Alertness: Awake/alert Orientation Level: Oriented to person;Disoriented to place;Disoriented to time;Disoriented to situation Attention: Focused;Sustained Focused Attention: Appears intact Sustained Attention: Appears intact Memory: Impaired Memory Impairment: Storage deficit;Retrieval deficit;Decreased recall of new information;Decreased short term memory Decreased Short Term Memory: Verbal basic Awareness: Impaired Awareness Impairment: Intellectual impairment;Anticipatory impairment Problem Solving: Impaired Problem Solving Impairment: Verbal basic Behaviors: Poor frustration tolerance Safety/Judgment: Impaired    Comprehension  Auditory Comprehension Overall Auditory  Comprehension: Appears within  functional limits for tasks assessed    Expression Verbal Expression Overall Verbal Expression: Appears within functional limits for tasks assessed   Oral / Motor  Oral Motor/Sensory Function Overall Oral Motor/Sensory Function: Within functional limits Motor Speech Overall Motor Speech: Appears within functional limits for tasks assessed   GO                   Harlon DittyBonnie Landon Truax, MA CCC-SLP [phone]  Claudine MoutonDeBlois, Chalise Pe Caroline 10/23/2015, 3:49 PM

## 2015-10-23 NOTE — Progress Notes (Addendum)
Inpatient Diabetes Program Recommendations  AACE/ADA: New Consensus Statement on Inpatient Glycemic Control (2015)  Target Ranges:  Prepandial:   less than 140 mg/dL      Peak postprandial:   less than 180 mg/dL (1-2 hours)      Critically ill patients:  140 - 180 mg/dL   Review of Glycemic Control: Average glucose running in 200's range-however patient has been on IV solumedrol-last dose early am today. Now on prednisone 40 mg daily  Inpatient Diabetes Program Recommendations:    Noted patient has been ordered a glucometer as in-patient order. Please note that this order needs be repeated as a discharge supply need, order # [id number] which includes lancets and strips. Noted A1C has been ordered to assess control, however hx of thrombocytopenia may well effect HgbA1C results. . AD [facility]. Thank you Lenor CoffinAnn Ura Hausen, RN, MSN, CDE  Diabetes Inpatient Program Office: [phone] Pager: [phone] 8:00 am to 5:00 pm

## 2015-10-23 NOTE — Evaluation (Signed)
Clinical/Bedside Swallow Evaluation Patient Details  Name: RAIF CHACHERE MRN: [id number] Date of Birth: 1940/08/11  Today's Date: 10/23/2015 Time: SLP Start Time (ACUTE ONLY): 1500 SLP Stop Time (ACUTE ONLY): 1527 SLP Time Calculation (min) (ACUTE ONLY): 27 min  Past Medical History:  Past Medical History  Diagnosis Date  . Allergy 1978    rag weed, dust, animal dander, pollen  . Asthma   . Obesity 1974  . Gout   . Atrial fibrillation (HCC)   . Arthritis   . Hypertension   . Hyperlipidemia   . Chronic insomnia   . BPH (benign prostatic hypertrophy)    Past Surgical History: History reviewed. No pertinent past surgical history. HPI:  75 y.o. male with PMH of hypertension, hyperlipidemia, asthma, and atrial fibrillation who is brought in to the ED after being found down by his daughter. Patient is chronically ill and, unfortunately, has not been adherent to his medical treatment plan. He was found by his daughter to be lying on the floor unresponsive with foul urine odor. He was arousable, but per report, very weak and lethargic. He is prescribed 3 L/m supplemental oxygen around the clock. He was not wearing his oxygen upon EMS arrival. Pt has been awake but unable to follow commands. MRI not completed, but CT negative.    Assessment / Plan / Recommendation Clinical Impression  Pt demonstrates normal swallow function. No signs of aspiration or difficulty swallowing observed. Continue current diet. SLP will sign off.     Aspiration Risk  Mild aspiration risk    Diet Recommendation Regular;Thin liquid   Liquid Administration via: Cup;Straw Medication Administration: Whole meds with liquid Supervision: Patient able to self feed    Other  Recommendations     Follow up Recommendations  24 hour supervision/assistance (due to cognitive impairment)    Frequency and Duration            Prognosis        Swallow Study   General HPI: 75 y.o. male with PMH of hypertension,  hyperlipidemia, asthma, and atrial fibrillation who is brought in to the ED after being found down by his daughter. Patient is chronically ill and, unfortunately, has not been adherent to his medical treatment plan. He was found by his daughter to be lying on the floor unresponsive with foul urine odor. He was arousable, but per report, very weak and lethargic. He is prescribed 3 L/m supplemental oxygen around the clock. He was not wearing his oxygen upon EMS arrival. Pt has been awake but unable to follow commands. MRI not completed, but CT negative.  Type of Study: Bedside Swallow Evaluation Previous Swallow Assessment: none Diet Prior to this Study: Regular;Thin liquids Temperature Spikes Noted: No Respiratory Status: Nasal cannula History of Recent Intubation: No Behavior/Cognition: Alert;Cooperative;Requires cueing Oral Cavity Assessment: Within Functional Limits Oral Care Completed by SLP: No Oral Cavity - Dentition: Adequate natural dentition Vision: Functional for self-feeding Self-Feeding Abilities: Able to feed self Patient Positioning: Postural control interferes with function;Partially reclined Baseline Vocal Quality: Normal Volitional Cough: Strong Volitional Swallow: Able to elicit    Oral/Motor/Sensory Function Overall Oral Motor/Sensory Function: Within functional limits   Ice Chips     Thin Liquid Thin Liquid: Within functional limits    Nectar Thick Nectar Thick Liquid: Not tested   Honey Thick Honey Thick Liquid: Not tested   Puree Puree: Within functional limits   Solid   GO   Solid: Within functional limits       [employer],  MA CCC-SLP [phone]  Claudine MoutonDeBlois, Lindsey Hommel Caroline 10/23/2015,3:43 PM

## 2015-10-23 NOTE — [place] FL2 (Signed)
[place] MEDICAID FL2 LEVEL OF CARE SCREENING TOOL     IDENTIFICATION  Patient Name: Leonard Montoya Birthdate: 08/12/1940 Sex: male Admission Date (Current Location): 10/20/2015  [facility] [place]   Facility and Address:  [facility], [address], [place], [place] [address]      Provider Number: [id number]  Attending Physician Name and Address:  Richarda OverlieNayana Abrol, MD  Relative Name and Phone Number:       Current Level of Care: Hospital Recommended Level of Care: Skilled Nursing Facility Prior Approval Number:    Date Approved/Denied:   PASRR Number:    Discharge Plan: SNF    Current Diagnoses: Patient Active Problem List   Diagnosis Date Noted  . Pressure ulcer 10/23/2015  . Acute congestive heart failure (HCC)   . Atrial fibrillation with rapid ventricular response (HCC) 10/21/2015  . Sepsis (HCC) 10/21/2015  . AKI (acute kidney injury) (HCC) 10/21/2015  . Acute CHF (congestive heart failure) (HCC) 10/21/2015  . Thrombocytopenia (HCC) 10/21/2015  . CAP (community acquired pneumonia) 10/21/2015  . Gait instability 10/18/2013  . At high risk for falls 09/28/2013  . Chronic insomnia 09/28/2013  . Essential hypertension, benign 09/27/2013  . Atrial fibrillation (HCC) 09/27/2013  . Morbid obesity (HCC) 09/27/2013  . Omphalitis in adult 09/27/2013  . Asthma, chronic 09/27/2013  . Venous stasis dermatitis 09/27/2013  . Other and unspecified hyperlipidemia 09/27/2013    Orientation RESPIRATION BLADDER Height & Weight     Self  O2 (3L) External catheter Weight: (!) 334 lb (151.501 kg) Height:  6\' 2"  (188 cm)  BEHAVIORAL SYMPTOMS/MOOD NEUROLOGICAL BOWEL NUTRITION STATUS  Other (Comment) (appropriate)  (n/a) Continent Diet (Heart Healthy)  AMBULATORY STATUS COMMUNICATION OF NEEDS Skin   Extensive Assist Verbally PU Stage and Appropriate Care     PU Stage 3 Dressing:  (PRN, R Buttocks)                 Personal Care  Assistance Level of Assistance  Bathing, Feeding, Dressing Bathing Assistance: Maximum assistance Feeding assistance: Limited assistance Dressing Assistance: Maximum assistance     Functional Limitations Info  Sight, Hearing, Speech Sight Info: Adequate Hearing Info: Adequate Speech Info: Adequate    SPECIAL CARE FACTORS FREQUENCY  PT (By licensed PT), OT (By licensed OT)     PT Frequency: 5x/week OT Frequency: 5x/week            Contractures Contractures Info: Not present    Additional Factors Info  Code Status, Allergies, Insulin Sliding Scale Code Status Info: DNR Allergies Info: Sudafed   Insulin Sliding Scale Info: 0-15 units 3x/day       Current Medications (10/23/2015):  This is the current hospital active medication list Current Facility-Administered Medications  Medication Dose Route Frequency Provider Last Rate Last Dose  . 0.9 %  sodium chloride infusion  250 mL Intravenous PRN Briscoe Deutscherimothy S Opyd, MD      . acetaminophen (TYLENOL) tablet 650 mg  650 mg Oral Q4H PRN Briscoe Deutscherimothy S Opyd, MD   650 mg at 10/22/15 1042  . antiseptic oral rinse (CPC / CETYLPYRIDINIUM CHLORIDE 0.05%) solution 7 mL  7 mL Mouth Rinse BID Richarda OverlieNayana Abrol, MD   7 mL at 10/23/15 1000  . aspirin EC tablet 81 mg  81 mg Oral Daily Briscoe Deutscherimothy S Opyd, MD   81 mg at 10/23/15 0829  . azithromycin (ZITHROMAX) 500 mg in dextrose 5 % 250 mL IVPB  500 mg Intravenous Once Caryn BeeKevin  Campos, MD      . azithromycin (ZITHROMAX) 500 mg in dextrose 5 % 250 mL IVPB  500 mg Intravenous Q24H Richarda Overlie, MD   500 mg at 10/23/15 1458  . cefTRIAXone (ROCEPHIN) 2 g in dextrose 5 % 50 mL IVPB  2 g Intravenous Q24H Richarda Overlie, MD   2 g at 10/23/15 1419  . digoxin (LANOXIN) 0.25 MG/ML injection 0.125 mg  0.125 mg Intravenous Daily Lavone Neri Opyd, MD   0.125 mg at 10/23/15 1055  . diltiazem (CARDIZEM) tablet 60 mg  60 mg Oral 3 times per day Richarda Overlie, MD   60 mg at 10/23/15 1417  . enoxaparin (LOVENOX) injection 60 mg  60 mg  Subcutaneous Q24H Richarda Overlie, MD   60 mg at 10/23/15 1101  . furosemide (LASIX) tablet 40 mg  40 mg Oral BID Richarda Overlie, MD   40 mg at 10/23/15 1055  . insulin aspart (novoLOG) injection 0-15 Units  0-15 Units Subcutaneous TID WC Briscoe Deutscher, MD   3 Units at 10/23/15 1240  . ondansetron (ZOFRAN) injection 4 mg  4 mg Intravenous Q6H PRN Briscoe Deutscher, MD      . Melene Muller ON 10/24/2015] predniSONE (DELTASONE) tablet 40 mg  40 mg Oral Q breakfast Richarda Overlie, MD      . simvastatin (ZOCOR) tablet 40 mg  40 mg Oral Daily Briscoe Deutscher, MD   40 mg at 10/23/15 0829  . sodium chloride flush (NS) 0.9 % injection 3 mL  3 mL Intravenous Q12H Briscoe Deutscher, MD   3 mL at 10/23/15 0835  . sodium chloride flush (NS) 0.9 % injection 3 mL  3 mL Intravenous PRN Briscoe Deutscher, MD      . tamsulosin (FLOMAX) capsule 0.4 mg  0.4 mg Oral Daily Briscoe Deutscher, MD   0.4 mg at 10/23/15 1610     Discharge Medications: Please see discharge summary for a list of discharge medications.  Relevant Imaging Results:  Relevant Lab Results:   Additional Information SS# [id number]   Jenita Seashore BSW Intern, [phone]

## 2015-10-23 NOTE — Telephone Encounter (Signed)
[facility] stating this pt has been admitted to acute care hospital  [facility]  Admit Date: 10/20/15  Dx: R09.02- Hypoxemia  Admitting physician: Abrol,Nayana,MD  PCP: Jaquelyn BitterWarren Pickard,MD  Pending authorization: [id number]: [id number]

## 2015-10-23 NOTE — Clinical Social Work Placement (Signed)
   CLINICAL SOCIAL WORK PLACEMENT  NOTE  Date:  10/23/2015  Patient Details  Name: Leonard Montoya MRN: [id number] Date of Birth: 01/08/1941  Clinical Social Work is seeking post-discharge placement for this patient at the Skilled  Nursing Facility level of care (*CSW will initial, date and re-position this form in  chart as items are completed):  Yes   Patient/family provided with [place] Clinical Social Work Department's list of facilities offering this level of care within the geographic area requested by the patient (or if unable, by the patient's family).  Yes   Patient/family informed of their freedom to choose among providers that offer the needed level of care, that participate in Medicare, Medicaid or managed care program needed by the patient, have an available bed and are willing to accept the patient.  Yes   Patient/family informed of [facility]'s ownership interest in [facility] and [facility], as well as of the fact that they are under no obligation to receive care at these facilities.  PASRR submitted to EDS on       PASRR number received on       Existing PASRR number confirmed on       FL2 transmitted to all facilities in geographic area requested by pt/family on 10/23/15     FL2 transmitted to all facilities within larger geographic area on       Patient informed that his/her managed care company has contracts with or will negotiate with certain facilities, including the following:            Patient/family informed of bed offers received.  Patient chooses bed at       Physician recommends and patient chooses bed at      Patient to be transferred to   on  .  Patient to be transferred to facility by       Patient family notified on   of transfer.  Name of family member notified:        PHYSICIAN Please sign FL2, Please prepare prescriptions     Additional Comment:    _______________________________________________ Venita Lickampbell, Rayette Mogg B,  LCSW 10/23/2015, 5:18 PM

## 2015-10-23 NOTE — Consult Note (Signed)
   [facility]   10/23/2015  Leonard Montoya 10/23/1940 [id number]   [facility] received. [facility]. However, it appears he did not have a Primary Care MD. Therefore, he was ineligible for [facility] services. Please see chart review tab then notes for [facility]'s community team's patient outreach correspondence. Spoke with patient at bedside. However, his daughter is primary contact, Leonard Montoya. Chart reviewed and noted recommendations are for SNF and also palliative care consult pending. Will engage patient's daughter at a more appropriate time to confirm Primary Care MD and the like. Will continue to follow. Made inpatient RNCM aware.  Raiford NobleAtika Hall, MSN-Ed, RN,BSN [facility] Hospital Liaison [phone]

## 2015-10-23 NOTE — Progress Notes (Signed)
Physical Therapy Treatment Patient Details Name: Leonard LowesJoel A Montoya MRN: [id number] DOB: 03/13/1941 Today's Date: 10/23/2015    History of Present Illness Patient is a 75 yo male admitted 10/20/15 after being found down by daughter.  Patient with Afib with RVR, sepsis, CAP, UTI, AKI, AMS, CHF, speech deficits.  MRI to r/o CVA pending.    PMH:  HTN, HLD, asthma, Afib, CHF, obesity, on home O2 at 3 l/min    PT Comments    Pt progressing well towards goals. Pt able to transfer and sit EOB this date with maxAx2. Pt attempted to participate in LE and UE there ex however pt extremely delayed and weak requiring assist to complete task. Acute PT to con't to follow to progress mobility as able.  Follow Up Recommendations  SNF;Supervision/Assistance - 24 hour     Equipment Recommendations  Wheelchair (measurements PT);Wheelchair cushion (measurements PT)    Recommendations for Other Services       Precautions / Restrictions Precautions Precautions: Fall Restrictions Weight Bearing Restrictions: No    Mobility  Bed Mobility Overal bed mobility: Needs Assistance Bed Mobility: Supine to Sit;Sit to Supine     Supine to sit: Max assist;+2 for physical assistance Sit to supine: Max assist;+2 for physical assistance   General bed mobility comments: pt initiated LE movement however pt required maxA for trunk elevation and descend into bed as maxA for LE management in/out of bed  Transfers                 General transfer comment: unsafe at this time  Ambulation/Gait                 Stairs            Wheelchair Mobility    Modified Rankin (Stroke Patients Only)       Balance Overall balance assessment: Needs assistance Sitting-balance support: Feet supported;Bilateral upper extremity supported Sitting balance-Leahy Scale: Poor Sitting balance - Comments: pt sat EOB with variance of assist from supervision to modA. worked on LE ther ex and raising UEs, pt extremely  delayed and required hand over hand cues to complete task. pt attempted to complete LAQ on each LE however required modA to complete task                            Cognition Arousal/Alertness: Awake/alert Behavior During Therapy: Flat affect Overall Cognitive Status: Impaired/Different from baseline Area of Impairment: Orientation;Attention;Following commands;Problem solving Orientation Level: Disoriented to;Time;Situation Current Attention Level: Sustained   Following Commands: Follows one step commands with increased time     Problem Solving: Slow processing;Difficulty sequencing;Requires verbal cues;Requires tactile cues General Comments: pt extremely delayed with both verbal and physical response    Exercises      General Comments        Pertinent Vitals/Pain Pain Assessment: No/denies pain    Home Living Family/patient expects to be discharged to:: Skilled nursing facility                    Prior Function            PT Goals (current goals can now be found in the care plan section) Acute Rehab PT Goals Patient Stated Goal: none stated Progress towards PT goals: Progressing toward goals    Frequency  Min 2X/week    PT Plan Current plan remains appropriate    Co-evaluation  End of Session Equipment Utilized During Treatment: Oxygen Activity Tolerance: Patient tolerated treatment well Patient left: in bed;with call bell/phone within reach     Time: 0900-0927 PT Time Calculation (min) (ACUTE ONLY): 27 min  Charges:  $Therapeutic Exercise: 8-22 mins $Therapeutic Activity: 8-22 mins                    G Codes:      Marcene Brawn 10/23/2015, 11:42 AM   Lewis Shock, PT, DPT Pager #: [phone] Office #: [phone]

## 2015-10-23 NOTE — Clinical Social Work Note (Signed)
Clinical Social Work Assessment  Patient Details  Name: Leonard Montoya MRN: [id number] Date of Birth: 04/23/1941  Date of referral:  10/23/15               Reason for consult:  Facility Placement, Discharge Planning                Permission sought to share information with:  Facility Medical sales representativeContact Representative, Family Supports Permission granted to share information::  Yes, Verbal Permission Granted  Name::     Dois DavenportSandra "Andrey CampanileSandy" [facility]  Agency::  SNF  Relationship::  Daughter  Contact Information:  [phone]  Housing/Transportation Living arrangements for the past 2 months:  Single Family Home Source of Information:  Adult Children Patient Interpreter Needed:  None Criminal Activity/Legal Involvement Pertinent to Current Situation/Hospitalization:  No - Comment as needed Significant Relationships:  Adult Children Lives with:  Self Do you feel safe going back to the place where you live?  Yes Need for family participation in patient care:  Yes (Comment)  Care giving concerns:  Patient lives at home alone and is in need of 24hr supervision and assistance. Patient daughter does live five minutes away from patient.   Social Worker assessment / plan: BSW intern was unable to complete assessment with patient. BSW intern completed assessment with patient daughter over the phone. Patient daughter is willing to look into SNF. Patient daughter stated that pallative is going to meet with the patient and patient daughter tomorrow to discuss the plan of care. Patient daughter gave permission to send information to local SNFs. Patient daughter [facility]. BSW intern explained to daughter that [facility] a [facility]. Patient still okay with sending patient information to SNFs. BSW intern will continue to follow and assist as needed.   Employment status:  Retired Database administratornsurance information:  Managed Medicare PT Recommendations:  Skilled  Nursing Facility Information / Referral to community resources:  Skilled Nursing Facility  Patient/Family's Response to care:  Patient family appear to be satisfied with the care that the patient is currently receiving.  Patient/Family's Understanding of and Emotional Response to Diagnosis, Current Treatment, and Prognosis:  Patient family seem to have a good understanding of reason for patient admission, current treatment, and post discharge needs.  Emotional Assessment Appearance:  Appears stated age Attitude/Demeanor/Rapport:  Unable to Assess Affect (typically observed):  Unable to Assess Orientation:  Oriented to Self Alcohol / Substance use:  Not Applicable Psych involvement (Current and /or in the community):  No (Comment)  Discharge Needs  Concerns to be addressed:   (lives alone) Readmission within the last 30 days:  No Current discharge risk:  Lives alone Barriers to Discharge:  Continued Medical Work up   FiservMaggie Londen Bok BSW Intern, [phone]

## 2015-10-23 NOTE — Progress Notes (Signed)
Palliative consult request received Chart reviewed Patient seen and examined briefly Call placed and discussed with daughter Dois DavenportSandra at [phone] Family meeting to discuss goals of care and disposition arranged for 10-24-15 at 1600 Full note and recommendations to follow  Rosalin HawkingZeba Ashly Goethe MD [phone] office  [place] palliative medicine team [phone] pager

## 2015-10-23 NOTE — Progress Notes (Signed)
[facility] PROGRESS NOTE  Leonard Montoya ZOX:[id number] DOB: 1941-06-19 DOA: 10/20/2015 PCP: No PCP Per Patient  Length of stay: 2   Assessment/Plan: Active Problems:   Essential hypertension, benign   Atrial fibrillation with rapid ventricular response (HCC)   Sepsis (HCC)   AKI (acute kidney injury) (HCC)   Acute CHF (congestive heart failure) (HCC)   Thrombocytopenia (HCC)   CAP (community acquired pneumonia)   Acute congestive heart failure (HCC)   Pressure ulcer    Brief summary 75 y.o. male with PMH of hypertension, hyperlipidemia, asthma, and atrial fibrillation who is brought in to the ED after being found down by his daughter. Patient is chronically ill and, unfortunately, has not been adherent to his medical treatment plan. He was found by his daughter to be lying on the floor unresponsive with foul urine odor. He was arousable, but per report, very weak and lethargic. He is prescribed 3 L/m supplemental oxygen around the clock. He was not wearing his oxygen upon EMS arrival. He was found to have wheezes throughout and was given DuoNeb treatment en route to the hospital   Assessment and plan 1. Sepsis suspected secondary to CAP /UTI - Meets sepsis criteria on admission with leukocytosis, tachypnea, tachycardia, suspected PNA +/- UTI (UA remains pending) , markedly elevated procalcitonin , lactic acid on admission,  - Treated with empiric Rocephin and azithromycin in ED, will continue  Negative urine for strep pneumo antigens  Continue to follow blood culture, urine culture showing greater than 50,000 colonies, speciation and sensitivity pending   2. Acute on chronic congestive heart failure /abnormal troponin - Presents with peripheral edema, vascular congestion on CXR, elevated BNP  Initiated with IV Lasix, this was discontinued in the setting of acute kidney injury however 2-D echo shows that the patient's EF is 40-45% Resume Lasix orally Abnormal  troponin likely the setting of demand ischemia, trending upwards Discussed with the patient's daughter does not want any aggressive measures at this time She has declined cardiology consultation Holding Demadex, Aldactone,  3. Atrial fibrillation with RVR  - CHADS-VASc is 52 (age x2, CHF, HTN) , on Cardizem CD at home - Prescribed coumadin, but does not take due to inability to get to clinic appointments to monitor INR Offered Eliquis as an alternative but patient's daughter is not interested in starting anticoagulation at this time - Considered heparin gtt, but holding for now given thrombocytopenia  - RVR present on arrival, initiated on diltiazem infusion; now transition to by mouth Cardizem, continue IV digoxin    4. AKI - SCr 1.41 on admission, up from apparent baseline of 0.87 - Suspect this is secondary to acute infection , low EF Resume low-dose diuretics , follow renal function Kayexalate for hyperkalemia  5. Thrombocytopenia  - Platelet count 97,000 improving, likely secondary to sepsis - Anticipate improvement with treatment of infectious process  Follow CBC   6. Hypertension - BP has been soft so far  - Will treat cautiously prn   7.Expressive aphasia/chronic normal pressure hydrocephalus Patient is awake but unable to follow commands or speak MRI of the brain to rule out CVA in the setting of his atrial fibrillation , this was attempted but could not be done, CT of the head did not show any acute findings, ventriculomegaly concerning for chronic normal pressure hydrocephalus Speech therapy evaluation  PT OT consultation recommended SNF Patient's daughter has declined neurology/neurosurgical evaluation  DVT prophylaxsis lovenox   Code Status:  Code Status Orders     DNR   Start     Ordered     Family Communication: I have communicated with the daughter on a daily basis on 4/2,/3 Gretchen ShortMericka,Sandra Daughter [phone]  Patient has previously  voice his opinion to not go to SNF, not pursue aggressive treatment. Daughter agreeable to palliative care consultation  Disposition Plan: Pending palliative care consultation      Consultants:  None  Procedures:  None  Antibiotics: Anti-infectives    Start     Dose/Rate Route Frequency Ordered Stop   10/22/15 1400  cefTRIAXone (ROCEPHIN) 2 g in dextrose 5 % 50 mL IVPB     2 g 100 mL/hr over 30 Minutes Intravenous Every 24 hours 10/22/15 1205     10/22/15 1400  azithromycin (ZITHROMAX) 500 mg in dextrose 5 % 250 mL IVPB     500 mg 250 mL/hr over 60 Minutes Intravenous Every 24 hours 10/22/15 1205     10/21/15 0215  cefTRIAXone (ROCEPHIN) 1 g in dextrose 5 % 50 mL IVPB     1 g 100 mL/hr over 30 Minutes Intravenous  Once 10/21/15 0206 10/21/15 0339   10/21/15 0215  azithromycin (ZITHROMAX) 500 mg in dextrose 5 % 250 mL IVPB     500 mg 250 mL/hr over 60 Minutes Intravenous  Once 10/21/15 0206           HPI/Subjective: Patient unable to recall any information about his kids. Confused  Objective: Filed Vitals:   10/23/15 0323 10/23/15 0430 10/23/15 0608 10/23/15 0825  BP:  140/88 140/88 133/85  Pulse:  46  87  Temp:   98.4 F (36.9 C) 97.4 F (36.3 C)  TempSrc:   Oral Axillary  Resp:  12  22  Height:      Weight: 151.501 kg (334 lb)     SpO2:  99%      Intake/Output Summary (Last 24 hours) at 10/23/15 0915 Last data filed at 10/23/15 0609  Gross per 24 hour  Intake    960 ml  Output   1350 ml  Net   -390 ml    Exam:  Cardiac: Rate ~120 and irregular with grade IV holosystolic murmur throughout precordium. Pulm: Good air movement bilaterally. Rales b/l. Abd: Soft, nondistended, nontender, no rebound pain or gaurding, BS present. Ext: Bilateral LEs edematous. Hyperpigmentation of b/l LEs in gaiter distribution with superficial crusted ulcerations Musculoskeletal: No gross deformity, no red, hot, swollen joints  Skin: No rashes or wounds on exposed  surfaces  Neuro: Lethargic, arousable but not verbally responsive    Data Review   Micro Results Recent Results (from the past 240 hour(s))  MRSA PCR Screening     Status: None   Collection Time: 10/21/15  4:34 AM  Result Value Ref Range Status   MRSA by PCR NEGATIVE NEGATIVE Final    Comment:        The GeneXpert MRSA Assay (FDA approved for NASAL specimens only), is one component of a comprehensive MRSA colonization surveillance program. It is not intended to diagnose MRSA infection nor to guide or monitor treatment for MRSA infections.   Culture, Urine     Status: None (Preliminary result)   Collection Time: 10/21/15  7:59 AM  Result Value Ref Range Status   Specimen Description URINE, CLEAN CATCH  Final   Special Requests NONE  Final   Culture 50,000 COLONIES/mL GRAM NEGATIVE RODS  Final   Report Status PENDING  Incomplete  Culture, blood (Routine  X 2) w Reflex to ID Panel     Status: None (Preliminary result)   Collection Time: 10/21/15 12:00 PM  Result Value Ref Range Status   Specimen Description BLOOD RIGHT HAND  Final   Special Requests BOTTLES DRAWN AEROBIC ONLY 5CC  Final   Culture NO GROWTH 1 DAY  Final   Report Status PENDING  Incomplete  Culture, blood (Routine X 2) w Reflex to ID Panel     Status: None (Preliminary result)   Collection Time: 10/21/15 12:15 PM  Result Value Ref Range Status   Specimen Description BLOOD RIGHT HAND  Final   Special Requests PEDIATRICS 5CC  Final   Culture NO GROWTH 1 DAY  Final   Report Status PENDING  Incomplete    Radiology Reports Ct Head Wo Contrast  10/21/2015  CLINICAL DATA:  Found down. Past medical history of hypertension, asthma and atrial fibrillation. EXAM: CT HEAD WITHOUT CONTRAST TECHNIQUE: Contiguous axial images were obtained from the base of the skull through the vertex without intravenous contrast. COMPARISON:  None. FINDINGS: Brain: There is generalized brain atrophy with commensurate dilatation of the  sulci. Moderate ventriculomegaly is slightly out of proportion to the degree of brain atrophy. There is no mass, hemorrhage, edema or other evidence of acute parenchymal abnormality. No extra-axial hemorrhage. Vascular: No hyperdense vessel or unexpected calcification. Skull: Negative for fracture or focal lesion. Sinuses/Orbits: No acute findings. Other: None. IMPRESSION: 1. No acute findings. No intracranial mass, hemorrhage or edema. No skull fracture. 2. Moderate ventriculomegaly which is slightly out of proportion to the degree of brain atrophy raising the possibility of a chronic normal pressure hydrocephalus. Electronically Signed   By: Bary Richard M.D.   On: 10/21/2015 21:44   Dg Chest Portable 1 View  10/21/2015  CLINICAL DATA:  Acute onset of shortness of breath, generalized weakness and altered mental status. Initial encounter. EXAM: PORTABLE CHEST 1 VIEW COMPARISON:  Chest radiograph performed 09/12/2015 FINDINGS: The lungs are well-aerated. Vascular congestion is noted. Increased interstitial markings may reflect mild interstitial edema. Mild right basilar pneumonia cannot be excluded. No pleural effusion or pneumothorax is seen. The cardiomediastinal silhouette is enlarged. No acute osseous abnormalities are seen. IMPRESSION: Vascular congestion and cardiomegaly. Increased interstitial markings may reflect mild interstitial edema. Mild right basilar pneumonia cannot be excluded. Electronically Signed   By: Roanna Raider M.D.   On: 10/21/2015 00:20     CBC  Recent Labs Lab 10/21/15 0010 10/22/15 0546  WBC 24.2* 14.4*  HGB 15.1 14.3  HCT 44.7 43.3  PLT 97* 105*  MCV 89.9 89.3  MCH 30.4 29.5  MCHC 33.8 33.0  RDW 13.8 14.2    Chemistries   Recent Labs Lab 10/21/15 0010 10/21/15 1159 10/22/15 0546 10/22/15 1047 10/23/15 0515  NA 136 135 134* 133* 137  K 4.0 3.3* 5.7* 5.7* 3.6  CL 100* 99* 98* 99* 100*  CO2 23 21* 21* 20* 26  GLUCOSE 206* 220* 213* 278* 202*  BUN 51* 58*  57* 61* 56*  CREATININE 1.41* 1.51* 1.03 1.13 1.03  CALCIUM 8.6* 8.5* 8.5* 8.7* 8.8*  AST 55*  --  54*  --   --   ALT 39  --  32  --   --   ALKPHOS 161*  --  148*  --   --   BILITOT 2.2*  --  2.6*  --   --    ------------------------------------------------------------------------------------------------------------------ estimated creatinine clearance is 94.8 mL/min (by C-G formula based on Cr of 1.03). ------------------------------------------------------------------------------------------------------------------  No results for input(s): HGBA1C in the last 72 hours. ------------------------------------------------------------------------------------------------------------------ No results for input(s): CHOL, HDL, LDLCALC, TRIG, CHOLHDL, LDLDIRECT in the last 72 hours. ------------------------------------------------------------------------------------------------------------------ No results for input(s): TSH, T4TOTAL, T3FREE, THYROIDAB in the last 72 hours.  Invalid input(s): FREET3 ------------------------------------------------------------------------------------------------------------------ No results for input(s): VITAMINB12, FOLATE, FERRITIN, TIBC, IRON, RETICCTPCT in the last 72 hours.  Coagulation profile  Recent Labs Lab 10/21/15 0010  INR 1.29    No results for input(s): DDIMER in the last 72 hours.  Cardiac Enzymes  Recent Labs Lab 10/21/15 0723 10/21/15 1159 10/21/15 1900  TROPONINI 0.48* 0.13* 0.33*   ------------------------------------------------------------------------------------------------------------------ Invalid input(s): POCBNP   CBG:  Recent Labs Lab 10/22/15 0748 10/22/15 1141 10/22/15 1626 10/22/15 2139 10/23/15 0805  GLUCAP 209* 247* 306* 287* 204*       Studies: Ct Head Wo Contrast  10/21/2015  CLINICAL DATA:  Found down. Past medical history of hypertension, asthma and atrial fibrillation. EXAM: CT HEAD WITHOUT CONTRAST  TECHNIQUE: Contiguous axial images were obtained from the base of the skull through the vertex without intravenous contrast. COMPARISON:  None. FINDINGS: Brain: There is generalized brain atrophy with commensurate dilatation of the sulci. Moderate ventriculomegaly is slightly out of proportion to the degree of brain atrophy. There is no mass, hemorrhage, edema or other evidence of acute parenchymal abnormality. No extra-axial hemorrhage. Vascular: No hyperdense vessel or unexpected calcification. Skull: Negative for fracture or focal lesion. Sinuses/Orbits: No acute findings. Other: None. IMPRESSION: 1. No acute findings. No intracranial mass, hemorrhage or edema. No skull fracture. 2. Moderate ventriculomegaly which is slightly out of proportion to the degree of brain atrophy raising the possibility of a chronic normal pressure hydrocephalus. Electronically Signed   By: Bary Richard M.D.   On: 10/21/2015 21:44      Lab Results  Component Value Date   HGBA1C 6.8* 09/27/2013   Lab Results  Component Value Date   LDLCALC 85 09/27/2013   CREATININE 1.03 10/23/2015       Scheduled Meds: . antiseptic oral rinse  7 mL Mouth Rinse BID  . aspirin EC  81 mg Oral Daily  . azithromycin (ZITHROMAX) 500 MG IVPB  500 mg Intravenous Once  . azithromycin  500 mg Intravenous Q24H  . cefTRIAXone (ROCEPHIN)  IV  2 g Intravenous Q24H  . digoxin  0.125 mg Intravenous Daily  . diltiazem  60 mg Oral 3 times per day  . enoxaparin (LOVENOX) injection  40 mg Subcutaneous Q24H  . furosemide  40 mg Oral BID  . insulin aspart  0-15 Units Subcutaneous TID WC  . [START ON 10/24/2015] predniSONE  40 mg Oral Q breakfast  . simvastatin  40 mg Oral Daily  . sodium chloride flush  3 mL Intravenous Q12H  . tamsulosin  0.4 mg Oral Daily   Continuous Infusions:    Active Problems:   Essential hypertension, benign   Atrial fibrillation with rapid ventricular response (HCC)   Sepsis (HCC)   AKI (acute kidney injury)  (HCC)   Acute CHF (congestive heart failure) (HCC)   Thrombocytopenia (HCC)   CAP (community acquired pneumonia)   Acute congestive heart failure (HCC)   Pressure ulcer    Time spent: 45 minutes   [facility]  [facility] Pager [phone]. If 7PM-7AM, please contact night-coverage at [email or web address], password [facility] 10/23/2015, 9:15 AM  LOS: 2 days

## 2015-10-24 DIAGNOSIS — Z515 Encounter for palliative care: Secondary | ICD-10-CM

## 2015-10-24 DIAGNOSIS — Z7189 Other specified counseling: Secondary | ICD-10-CM

## 2015-10-24 DIAGNOSIS — R0602 Shortness of breath: Secondary | ICD-10-CM

## 2015-10-24 LAB — COMPREHENSIVE METABOLIC PANEL
ALT: 49 U/L (ref 17–63)
AST: 47 U/L — AB (ref 15–41)
Albumin: 2.3 g/dL — ABNORMAL LOW (ref 3.5–5.0)
Alkaline Phosphatase: 124 U/L (ref 38–126)
Anion gap: 14 (ref 5–15)
BILIRUBIN TOTAL: 1 mg/dL (ref 0.3–1.2)
BUN: 60 mg/dL — AB (ref 6–20)
CALCIUM: 8.6 mg/dL — AB (ref 8.9–10.3)
CHLORIDE: 95 mmol/L — AB (ref 101–111)
CO2: 26 mmol/L (ref 22–32)
CREATININE: 1.07 mg/dL (ref 0.61–1.24)
Glucose, Bld: 162 mg/dL — ABNORMAL HIGH (ref 65–99)
Potassium: 4 mmol/L (ref 3.5–5.1)
Sodium: 135 mmol/L (ref 135–145)
TOTAL PROTEIN: 5.7 g/dL — AB (ref 6.5–8.1)

## 2015-10-24 LAB — CBC
HCT: 42.9 % (ref 39.0–52.0)
Hemoglobin: 13.8 g/dL (ref 13.0–17.0)
MCH: 29.1 pg (ref 26.0–34.0)
MCHC: 32.2 g/dL (ref 30.0–36.0)
MCV: 90.3 fL (ref 78.0–100.0)
PLATELETS: 148 10*3/uL — AB (ref 150–400)
RBC: 4.75 MIL/uL (ref 4.22–5.81)
RDW: 13.8 % (ref 11.5–15.5)
WBC: 16.8 10*3/uL — AB (ref 4.0–10.5)

## 2015-10-24 LAB — GLUCOSE, CAPILLARY
GLUCOSE-CAPILLARY: 165 mg/dL — AB (ref 65–99)
GLUCOSE-CAPILLARY: 193 mg/dL — AB (ref 65–99)
Glucose-Capillary: 210 mg/dL — ABNORMAL HIGH (ref 65–99)
Glucose-Capillary: 206 mg/dL — ABNORMAL HIGH (ref 65–99)

## 2015-10-24 LAB — HEMOGLOBIN A1C
Hgb A1c MFr Bld: 6.3 % — ABNORMAL HIGH (ref 4.8–5.6)
MEAN PLASMA GLUCOSE: 134 mg/dL

## 2015-10-24 NOTE — Plan of Care (Signed)
Problem: Skin Integrity: Goal: Risk for impaired skin integrity will decrease Outcome: Progressing Antifungal powder applied to skin folds, sacral dsg applied, condom cath in use to reduce moisture associated excoriation.

## 2015-10-24 NOTE — Consult Note (Signed)
Consultation Note Date: 10/24/2015   Patient Name: Leonard Montoya  DOB: 08/16/1940  MRN: [id number]  Age / Sex: 75 y.o., male  PCP: No Pcp Per Patient Referring Physician: Reyne Dumas, MD  Reason for Consultation: Establishing goals of care    Clinical Assessment/Narrative:  75 year old gentleman lives at home by himself. Daughter Leonard Montoya healthcare power of attorney agent lives close by. Patient has a past medical history significant for hypertension, hyperlipidemia, asthma and atrial fibrillation. Patient's baseline is such that he is chronically ill and bedbound. He has a large body habitus. He did not like being compliant with his medications. He did not like going to doctors. Patient's daughter reports that he canceled several primary care physician appointments. He was also resistant to home health care as he did not like people coming to his home. He was a former English as a second language teacher, daughter describes him as a recluse or as long as she has known him.  So the patient has had gradual progressive decline for several months-years now. At present, he is admitted because of sepsis secondary to community-acquired pneumonia versus urinary tract infection. Additionally, acute on chronic congestive heart failure, known history of atrial fibrillation Hospital course complicated by rapid ventricular response. Hospital course initially also complicated by acute kidney injury. 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 Questionable additional neurological insult such as a cerebrovascular accident. Patient's body habitus not conducive to obtaining a MRI of the brain. It is reported that the patient was prescribed oral anticoagulation with Coumadin for his atrial fibrillation but did not take it regularly and did not like to go for INR monitoring to Coumadin clinics.  Daughter reports  patient has been unhappy for years. He is divorced. He has one daughter Leonard Montoya who lives nearby and is his healthcare power of attorney agent. He has a few other children who live out of state.  Introduced scope of palliative care. Position options discussed in detail. DO NOT RESUSCITATE/DO NOT INTUBATE discussed again and agreed upon. Patient did not want to go to a skilled nursing facility. Patient has suboptimal oral intake. He is 100% bedbound and dependent for activities of daily living. Various disposition options discussed in light of the patient not wanting to go to a skilled nursing facility. Option pertaining to hospice discussed. Daughter has familiarity with [facility], patient's aunt died of a stroke at [facility] and patient's in-laws passed away recently at [facility].  She is requesting hospice consultation. I think this is appropriate. Continue current mode of care for now. Hospice consultation and consideration for transfer to residential hospice by 10-25-15.  Contacts/Participants in Discussion: Primary Decision Maker:     Relationship to Patient   HCPOA: yes     SUMMARY OF RECOMMENDATIONS: DNR DNI Hospice consult Consider residential hospice on discharge Daughter does not want SNF.  Continue gentle treatments for now. No aggressive measures.   Code Status/Advance Care Planning: DNR    Code Status Orders        Start     Ordered   10/21/15 4098  Do not attempt resuscitation (DNR)   Continuous    Question Answer Comment  In the event of cardiac or respiratory ARREST Do not call a "code blue"   In the event of cardiac or respiratory ARREST Do not perform Intubation, CPR, defibrillation or ACLS   In the event of cardiac or respiratory ARREST Use medication by any route, position, wound care, and other measures to relive pain  and suffering. May use oxygen, suction and manual treatment of airway obstruction as needed for comfort.      10/21/15 1743    Code Status  History    Date Active Date Inactive Code Status Order ID Comments User Context   10/21/2015  6:18 AM 10/21/2015  5:43 PM Full Code [id number]  Vianne Bulls, MD Inpatient      Other Directives:Advanced Directive  Symptom Management:    as above   Palliative Prophylaxis:   Delirium Protocol  Psycho-social/Spiritual:  Support System: [place] Desire for further Chaplaincy support:no Additional Recommendations: Education on Hospice  Prognosis: < 3 months  Discharge Planning: Hospice facility    Chief Complaint/ Primary Diagnoses: Present on Admission:  . Atrial fibrillation with rapid ventricular response ([place]) . Essential hypertension, benign . Sepsis ([place]) . AKI (acute kidney injury) ([place]) . Thrombocytopenia ([place]) . CAP (community acquired pneumonia)  I have reviewed the medical record, interviewed the patient and family, and examined the patient. The following aspects are pertinent.  Past Medical History  Diagnosis Date  . Allergy 1978    rag weed, dust, animal dander, pollen  . Asthma   . Obesity 1974  . Gout   . Atrial fibrillation ([place])   . Arthritis   . Hypertension   . Hyperlipidemia   . Chronic insomnia   . BPH (benign prostatic hypertrophy)    Social History   Social History  . Marital Status: Single    Spouse Name: N/A  . Number of Children: N/A  . Years of Education: N/A   Social History Main Topics  . Smoking status: Former Smoker    Quit date: 07/23/1991  . Smokeless tobacco: Never Used  . Alcohol Use: No  . Drug Use: No  . Sexual Activity: Not Currently   Other Topics Concern  . None   Social History Narrative   Family History  Problem Relation Age of Onset  . Arthritis Mother   . Alcohol abuse Father   . Arthritis Father   . Diabetes Father   . Drug abuse Brother   . Learning disabilities Brother    Scheduled Meds: . antiseptic oral rinse  7 mL Mouth Rinse BID  . aspirin EC  81 mg Oral Daily  . azithromycin (ZITHROMAX) 500 MG  IVPB  500 mg Intravenous Once  . azithromycin  500 mg Intravenous Q24H  . cefTRIAXone (ROCEPHIN)  IV  2 g Intravenous Q24H  . digoxin  0.125 mg Intravenous Daily  . diltiazem  60 mg Oral 3 times per day  . enoxaparin (LOVENOX) injection  60 mg Subcutaneous Q24H  . furosemide  40 mg Oral BID  . insulin aspart  0-15 Units Subcutaneous TID WC  . predniSONE  40 mg Oral Q breakfast  . simvastatin  40 mg Oral Daily  . sodium chloride flush  3 mL Intravenous Q12H  . tamsulosin  0.4 mg Oral Daily   Continuous Infusions:  PRN Meds:.sodium chloride, acetaminophen, ondansetron (ZOFRAN) IV, sodium chloride flush Medications Prior to Admission:  Prior to Admission medications   Medication Sig Start Date End Date Taking? Authorizing Provider  albuterol (PROVENTIL HFA;VENTOLIN HFA) 108 (90 Base) MCG/ACT inhaler Inhale 1-2 puffs into the lungs every 6 (six) hours as needed for wheezing. Patient not taking: Reported on 10/21/2015 09/12/15   Tanna Furry, MD  Blood Glucose Monitoring Suppl (BLOOD GLUCOSE MONITOR KIT) KIT Blood Glucose Monitor- check FSBS 1x daily. Lancets &Test strips #50. DX:250.00 Patient not taking: Reported on 08/01/2015 10/01/13  Alycia Rossetti, MD  diltiazem (CARDIZEM CD) 180 MG 24 hr capsule Take 1 capsule (180 mg total) by mouth daily. Patient not taking: Reported on 10/21/2015 09/12/15   Tanna Furry, MD  doxazosin (CARDURA) 4 MG tablet Take 1 tablet (4 mg total) by mouth daily. Patient not taking: Reported on 10/21/2015 09/12/15   Tanna Furry, MD  simvastatin (ZOCOR) 40 MG tablet Take 1 tablet (40 mg total) by mouth daily. Patient not taking: Reported on 10/21/2015 09/12/15   Tanna Furry, MD  spironolactone (ALDACTONE) 100 MG tablet Take 1 tablet (100 mg total) by mouth 2 (two) times daily. Patient not taking: Reported on 10/21/2015 09/12/15   Tanna Furry, MD  sulfamethoxazole-trimethoprim (BACTRIM DS) 800-160 MG per tablet Take 1 tablet by mouth 2 (two) times daily. Patient not taking: Reported  on 08/01/2015 09/27/13   Alycia Rossetti, MD  tamsulosin (FLOMAX) 0.4 MG CAPS capsule Take 1 capsule (0.4 mg total) by mouth daily. Patient not taking: Reported on 10/21/2015 09/12/15   Tanna Furry, MD  torsemide (DEMADEX) 20 MG tablet Take 1 tablet (20 mg total) by mouth 2 (two) times daily. Patient not taking: Reported on 10/21/2015 09/12/15   Tanna Furry, MD  warfarin (COUMADIN) 5 MG tablet Take 1 tablet (5 mg total) by mouth daily. Patient not taking: Reported on 10/21/2015 09/12/15   Tanna Furry, MD   Allergies  Allergen Reactions  . Sudafed [Pseudoephedrine Hcl]     Weird Dreams    Review of Systems Opens eyes to voice command but does not engage  Physical Exam Obese gentleman resting in bed S1 S2 Shallow diminished breath sounds due to body habitus Abdomen distended Edema Does not verbalize much.  Follows some commands  Vital Signs: BP 153/78 mmHg  Pulse 88  Temp(Src) 98.4 F (36.9 C) (Oral)  Resp 13  Ht '6\' 2"'  (1.88 m)  Wt 146.058 kg (322 lb)  BMI 41.32 kg/m2  SpO2 92%  SpO2: SpO2: 92 % O2 Device:SpO2: 92 % O2 Flow Rate: .O2 Flow Rate (L/min): 3 L/min  IO: Intake/output summary:  Intake/Output Summary (Last 24 hours) at 10/24/15 1622 Last data filed at 10/24/15 1328  Gross per 24 hour  Intake    640 ml  Output   1350 ml  Net   -710 ml    LBM: Last BM Date: 10/20/15 Baseline Weight: Weight: (!) 149.233 kg (329 lb) Most recent weight: Weight: (!) 146.058 kg (322 lb)      Palliative Assessment/Data:  Flowsheet Rows        Most Recent Value   Intake Tab    Referral Department  Hospitalist   Unit at Time of Referral  Med/Surg Unit   Palliative Care Primary Diagnosis  Neurology   Date Notified  10/23/15   Palliative Care Type  New Palliative care   Reason for referral  Clarify Goals of Care   Date of Admission  10/20/15   Date first seen by Palliative Care  10/24/15   # of days Palliative referral response time  1 Day(s)   # of days IP prior to Palliative referral   3   Clinical Assessment    Palliative Performance Scale Score  20%   Pain Max last 24 hours  4   Pain Min Last 24 hours  3   Dyspnea Max Last 24 Hours  3   Dyspnea Min Last 24 hours  2   Psychosocial & Spiritual Assessment    Palliative Care Outcomes    Patient/Family  meeting held?  Yes   Who was at the meeting?  hcpoa daughter Leonard Montoya    Palliative Care Outcomes  Clarified goals of care   Palliative Care follow-up planned  Yes, Facility      Additional Data Reviewed:  CBC:    Component Value Date/Time   WBC 16.8* 10/24/2015 0356   HGB 13.8 10/24/2015 0356   HCT 42.9 10/24/2015 0356   PLT 148* 10/24/2015 0356   MCV 90.3 10/24/2015 0356   NEUTROABS 10.3* 09/12/2015 2056   LYMPHSABS 1.4 09/12/2015 2056   MONOABS 0.8 09/12/2015 2056   EOSABS 0.1 09/12/2015 2056   BASOSABS 0.1 09/12/2015 2056   Comprehensive Metabolic Panel:    Component Value Date/Time   NA 135 10/24/2015 0356   K 4.0 10/24/2015 0356   CL 95* 10/24/2015 0356   CO2 26 10/24/2015 0356   BUN 60* 10/24/2015 0356   CREATININE 1.07 10/24/2015 0356   CREATININE 1.49* 09/27/2013 1643   GLUCOSE 162* 10/24/2015 0356   CALCIUM 8.6* 10/24/2015 0356   AST 47* 10/24/2015 0356   ALT 49 10/24/2015 0356   ALKPHOS 124 10/24/2015 0356   BILITOT 1.0 10/24/2015 0356   PROT 5.7* 10/24/2015 0356   ALBUMIN 2.3* 10/24/2015 0356     Time In: 1515 Time Out: 1615 Time Total: 60 Greater than 50%  of this time was spent counseling and coordinating care related to the above assessment and plan.  Signed by: Loistine Chance, MD [id number] Loistine Chance, MD  10/24/2015, 4:22 PM  Please contact Palliative Medicine Team phone at [phone] for questions and concerns.

## 2015-10-24 NOTE — Progress Notes (Signed)
[facility] PROGRESS NOTE  CLARENCE COGSWELL HRC:[id number] DOB: 09-19-1940 DOA: 10/20/2015 PCP: No PCP Per Patient  Length of stay: 3   Assessment/Plan: Active Problems:   Essential hypertension, benign   Atrial fibrillation with rapid ventricular response (HCC)   Sepsis ([place])   AKI (acute kidney injury) ([place])   Acute CHF (congestive heart failure) (HCC)   Thrombocytopenia (HCC)   CAP (community acquired pneumonia)   Acute congestive heart failure ([place])   Pressure ulcer    Brief summary 75 y.o. male with PMH of hypertension, hyperlipidemia, asthma, and atrial fibrillation who is brought in to the ED after being found down by his daughter. Patient is chronically ill and, unfortunately, has not been adherent to his medical treatment plan. He was found by his daughter to be lying on the floor unresponsive with foul urine odor. He was arousable, but per report, very weak and lethargic. He is prescribed 3 L/m supplemental oxygen around the clock. He was not wearing his oxygen upon EMS arrival. He was found to have wheezes throughout and was given DuoNeb treatment en route to the hospital   Assessment and plan 1. Sepsis suspected secondary to CAP /UTI - Met sepsis criteria on admission with leukocytosis, tachypnea, tachycardia, suspected PNA +/- UTI (UA remains pending) , markedly elevated procalcitonin , lactic acid on admission,  - Treated with empiric Rocephin and azithromycin in ED, will continue  Negative urine for strep pneumo antigens  Continue to follow blood culture, urine culture showing greater than 50,000 colonies, PROTEUS MIRABILIS Cont abx pending palliative care recommendations    2. Acute on chronic congestive heart failure /abnormal troponin - Presents with peripheral edema, vascular congestion on CXR, elevated BNP  Initiated with IV Lasix, this was discontinued in the setting of acute kidney injury however 2-D echo shows that the patient's EF is  40-45% Resume Lasix orally Abnormal troponin likely the setting of demand ischemia, trending upwards Discussed with the patient's daughter does not want any aggressive measures at this time She has declined cardiology consultation Holding Demadex, Aldactone,   3. Atrial fibrillation with RVR  - CHADS-VASc is 64 (age x2, CHF, HTN) , on Cardizem CD at home - Prescribed coumadin, but does not take due to inability to get to clinic appointments to monitor INR Offered Eliquis as an alternative but patient's daughter is not interested in starting anticoagulation at this time - Considered heparin gtt, but holding for now given thrombocytopenia  - RVR present on arrival, initiated on diltiazem infusion; now transition to by mouth Cardizem, continue IV digoxin    4. AKI - SCr 1.41 on admission, up from apparent baseline of 0.87 - Suspect this is secondary to acute infection , low EF Resume low-dose diuretics , follow renal function Kayexalate for hyperkalemia  5. Thrombocytopenia  - Platelet count 97,000 improving, likely secondary to sepsis - Anticipate improvement with treatment of infectious process  Follow CBC   6. 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 Patient is awake but unable to follow commands or speak in meaningful sentences  MRI of the brain to rule out CVA in the setting of his atrial fibrillation , this was attempted but could not be done due to over the weight limit , CT of the head did not show any acute findings, ventriculomegaly concerning for chronic normal pressure hydrocephalus Speech therapy evaluation  Regular;Thin  liquid PT OT consultation recommended SNF Patient's daughter has declined neurology/neurosurgical evaluation     DVT prophylaxsis lovenox   Code Status:      Code Status Orders     DNR   Start     Ordered     Family  Communication: I have communicated with the daughter on a daily basis on 4/2,/3 Sherlynn Carbon Daughter [phone]  Patient has previously voiced his opinion to not go to SNF, not pursue aggressive treatment. Daughter agreeable to palliative care consultation  Disposition Plan: Pending palliative care consultation today       Consultants:  None  Procedures:  None  Antibiotics: Anti-infectives    Start     Dose/Rate Route Frequency Ordered Stop   10/22/15 1400  cefTRIAXone (ROCEPHIN) 2 g in dextrose 5 % 50 mL IVPB     2 g 100 mL/hr over 30 Minutes Intravenous Every 24 hours 10/22/15 1205     10/22/15 1400  azithromycin (ZITHROMAX) 500 mg in dextrose 5 % 250 mL IVPB     500 mg 250 mL/hr over 60 Minutes Intravenous Every 24 hours 10/22/15 1205     10/21/15 0215  cefTRIAXone (ROCEPHIN) 1 g in dextrose 5 % 50 mL IVPB     1 g 100 mL/hr over 30 Minutes Intravenous  Once 10/21/15 0206 10/21/15 0339   10/21/15 0215  azithromycin (ZITHROMAX) 500 mg in dextrose 5 % 250 mL IVPB     500 mg 250 mL/hr over 60 Minutes Intravenous  Once 10/21/15 0206           HPI/Subjective:   Confused, not able to follow commands   Objective: Filed Vitals:   10/24/15 0735 10/24/15 0816 10/24/15 0830 10/24/15 1113  BP: 135/86  136/80 130/80  Pulse: 40  62 41  Temp:  98 F (36.7 C)    TempSrc:  Oral    Resp: '18  14 14  ' Height:      Weight:      SpO2: 93%  96% 97%    Intake/Output Summary (Last 24 hours) at 10/24/15 1228 Last data filed at 10/24/15 1022  Gross per 24 hour  Intake    240 ml  Output   1350 ml  Net  -1110 ml    Exam:  Cardiac: Rate ~120 and irregular with grade IV holosystolic murmur throughout precordium. Pulm: Good air movement bilaterally. Rales b/l. Abd: Soft, nondistended, nontender, no rebound pain or gaurding, BS present. Ext: Bilateral LEs edematous. Hyperpigmentation of b/l LEs in gaiter distribution with superficial crusted ulcerations Musculoskeletal: No  gross deformity, no red, hot, swollen joints  Skin: No rashes or wounds on exposed surfaces  Neuro: Lethargic, arousable but not verbally responsive    Data Review   Micro Results Recent Results (from the past 240 hour(s))  MRSA PCR Screening     Status: None   Collection Time: 10/21/15  4:34 AM  Result Value Ref Range Status   MRSA by PCR NEGATIVE NEGATIVE Final    Comment:        The GeneXpert MRSA Assay (FDA approved for NASAL specimens only), is one component of a comprehensive MRSA colonization surveillance program. It is not intended to diagnose MRSA infection nor to guide or monitor treatment for MRSA infections.   Culture, Urine     Status: None   Collection Time: 10/21/15  7:59 AM  Result Value Ref Range Status   Specimen Description URINE, CLEAN CATCH  Final   Special Requests NONE  Final  Culture 50,000 COLONIES/mL PROTEUS MIRABILIS  Final   Report Status 10/23/2015 FINAL  Final   Organism ID, Bacteria PROTEUS MIRABILIS  Final      Susceptibility   Proteus mirabilis - MIC*    AMPICILLIN <=2 SENSITIVE Sensitive     CEFAZOLIN <=4 SENSITIVE Sensitive     CEFTRIAXONE <=1 SENSITIVE Sensitive     CIPROFLOXACIN <=0.25 SENSITIVE Sensitive     GENTAMICIN <=1 SENSITIVE Sensitive     IMIPENEM <=0.25 SENSITIVE Sensitive     NITROFURANTOIN 64 RESISTANT Resistant     TRIMETH/SULFA <=20 SENSITIVE Sensitive     AMPICILLIN/SULBACTAM <=2 SENSITIVE Sensitive     PIP/TAZO <=4 SENSITIVE Sensitive     * 50,000 COLONIES/mL PROTEUS MIRABILIS  Culture, blood (Routine X 2) w Reflex to ID Panel     Status: None (Preliminary result)   Collection Time: 10/21/15 12:00 PM  Result Value Ref Range Status   Specimen Description BLOOD RIGHT HAND  Final   Special Requests BOTTLES DRAWN AEROBIC ONLY 5CC  Final   Culture NO GROWTH 2 DAYS  Final   Report Status PENDING  Incomplete  Culture, blood (Routine X 2) w Reflex to ID Panel     Status: None (Preliminary result)   Collection Time:  10/21/15 12:15 PM  Result Value Ref Range Status   Specimen Description BLOOD RIGHT HAND  Final   Special Requests PEDIATRICS 5CC  Final   Culture NO GROWTH 2 DAYS  Final   Report Status PENDING  Incomplete    Radiology Reports Ct Head Wo Contrast  10/21/2015  CLINICAL DATA:  Found down. Past medical history of hypertension, asthma and atrial fibrillation. EXAM: CT HEAD WITHOUT CONTRAST TECHNIQUE: Contiguous axial images were obtained from the base of the skull through the vertex without intravenous contrast. COMPARISON:  None. FINDINGS: Brain: There is generalized brain atrophy with commensurate dilatation of the sulci. Moderate ventriculomegaly is slightly out of proportion to the degree of brain atrophy. There is no mass, hemorrhage, edema or other evidence of acute parenchymal abnormality. No extra-axial hemorrhage. Vascular: No hyperdense vessel or unexpected calcification. Skull: Negative for fracture or focal lesion. Sinuses/Orbits: No acute findings. Other: None. IMPRESSION: 1. No acute findings. No intracranial mass, hemorrhage or edema. No skull fracture. 2. Moderate ventriculomegaly which is slightly out of proportion to the degree of brain atrophy raising the possibility of a chronic normal pressure hydrocephalus. Electronically Signed   By: Franki Cabot M.D.   On: 10/21/2015 21:44   Dg Chest Port 1 View  10/23/2015  CLINICAL DATA:  Shortness of breath, asthma, hypertension, atrial fibrillation. EXAM: PORTABLE CHEST 1 VIEW COMPARISON:  10/21/2015. FINDINGS: Cardiomegaly persists. Overall improved edema. No effusion or pneumothorax. No osseous findings. IMPRESSION: Improved aeration.  Cardiomegaly. Electronically Signed   By: Staci Righter M.D.   On: 10/23/2015 10:24   Dg Chest Portable 1 View  10/21/2015  CLINICAL DATA:  Acute onset of shortness of breath, generalized weakness and altered mental status. Initial encounter. EXAM: PORTABLE CHEST 1 VIEW COMPARISON:  Chest radiograph performed  09/12/2015 FINDINGS: The lungs are well-aerated. Vascular congestion is noted. Increased interstitial markings may reflect mild interstitial edema. Mild right basilar pneumonia cannot be excluded. No pleural effusion or pneumothorax is seen. The cardiomediastinal silhouette is enlarged. No acute osseous abnormalities are seen. IMPRESSION: Vascular congestion and cardiomegaly. Increased interstitial markings may reflect mild interstitial edema. Mild right basilar pneumonia cannot be excluded. Electronically Signed   By: Garald Balding M.D.   On: 10/21/2015 00:20  CBC  Recent Labs Lab 10/21/15 0010 10/22/15 0546 10/24/15 0356  WBC 24.2* 14.4* 16.8*  HGB 15.1 14.3 13.8  HCT 44.7 43.3 42.9  PLT 97* 105* 148*  MCV 89.9 89.3 90.3  MCH 30.4 29.5 29.1  MCHC 33.8 33.0 32.2  RDW 13.8 14.2 13.8    Chemistries   Recent Labs Lab 10/21/15 0010 10/21/15 1159 10/22/15 0546 10/22/15 1047 10/23/15 0515 10/23/15 1001 10/24/15 0356  NA 136 135 134* 133* 137  --  135  K 4.0 3.3* 5.7* 5.7* 3.6  --  4.0  CL 100* 99* 98* 99* 100*  --  95*  CO2 23 21* 21* 20* 26  --  26  GLUCOSE 206* 220* 213* 278* 202*  --  162*  BUN 51* 58* 57* 61* 56*  --  60*  CREATININE 1.41* 1.51* 1.03 1.13 1.03  --  1.07  CALCIUM 8.6* 8.5* 8.5* 8.7* 8.8*  --  8.6*  AST 55*  --  54*  --   --  68* 47*  ALT 39  --  32  --   --  54 49  ALKPHOS 161*  --  148*  --   --  131* 124  BILITOT 2.2*  --  2.6*  --   --  0.9 1.0   ------------------------------------------------------------------------------------------------------------------ estimated creatinine clearance is 89.6 mL/min (by C-G formula based on Cr of 1.07). ------------------------------------------------------------------------------------------------------------------  Recent Labs  10/23/15 1001  HGBA1C 6.3*   ------------------------------------------------------------------------------------------------------------------ No results for input(s): CHOL,  HDL, LDLCALC, TRIG, CHOLHDL, LDLDIRECT in the last 72 hours. ------------------------------------------------------------------------------------------------------------------  Recent Labs  10/23/15 1001  TSH 0.662   ------------------------------------------------------------------------------------------------------------------  Recent Labs  10/23/15 1001  VITAMINB12 799    Coagulation profile  Recent Labs Lab 10/21/15 0010  INR 1.29    No results for input(s): DDIMER in the last 72 hours.  Cardiac Enzymes  Recent Labs Lab 10/21/15 0723 10/21/15 1159 10/21/15 1900  TROPONINI 0.48* 0.13* 0.33*   ------------------------------------------------------------------------------------------------------------------ Invalid input(s): POCBNP   CBG:  Recent Labs Lab 10/23/15 1121 10/23/15 1656 10/23/15 2134 10/24/15 0735 10/24/15 1113  GLUCAP 200* 296* 268* 165* 193*       Studies: Dg Chest Port 1 View  10/23/2015  CLINICAL DATA:  Shortness of breath, asthma, hypertension, atrial fibrillation. EXAM: PORTABLE CHEST 1 VIEW COMPARISON:  10/21/2015. FINDINGS: Cardiomegaly persists. Overall improved edema. No effusion or pneumothorax. No osseous findings. IMPRESSION: Improved aeration.  Cardiomegaly. Electronically Signed   By: Staci Righter M.D.   On: 10/23/2015 10:24      Lab Results  Component Value Date   HGBA1C 6.3* 10/23/2015   HGBA1C 6.0* 10/21/2015   HGBA1C 6.8* 09/27/2013   Lab Results  Component Value Date   LDLCALC 85 09/27/2013   CREATININE 1.07 10/24/2015       Scheduled Meds: . antiseptic oral rinse  7 mL Mouth Rinse BID  . aspirin EC  81 mg Oral Daily  . azithromycin (ZITHROMAX) 500 MG IVPB  500 mg Intravenous Once  . azithromycin  500 mg Intravenous Q24H  . cefTRIAXone (ROCEPHIN)  IV  2 g Intravenous Q24H  . digoxin  0.125 mg Intravenous Daily  . diltiazem  60 mg Oral 3 times per day  . enoxaparin (LOVENOX) injection  60 mg  Subcutaneous Q24H  . furosemide  40 mg Oral BID  . insulin aspart  0-15 Units Subcutaneous TID WC  . predniSONE  40 mg Oral Q breakfast  . simvastatin  40 mg Oral Daily  . sodium  chloride flush  3 mL Intravenous Q12H  . tamsulosin  0.4 mg Oral Daily   Continuous Infusions:    Active Problems:   Essential hypertension, benign   Atrial fibrillation with rapid ventricular response (HCC)   Sepsis ([place])   AKI (acute kidney injury) ([place])   Acute CHF (congestive heart failure) ([place])   Thrombocytopenia (HCC)   CAP (community acquired pneumonia)   Acute congestive heart failure ([place])   Pressure ulcer    Time spent: 45 minutes   [facility] Pager [phone]. If 7PM-7AM, please contact night-coverage at [email or web address], password [facility] 10/24/2015, 12:28 PM  LOS: 3 days

## 2015-10-24 NOTE — Care Management Important Message (Signed)
Important Message  Patient Details  Name: Fran LowesJoel A Cohill MRN: [id number] Date of Birth: 12/25/1940   Medicare Important Message Given:  Yes    Genola Yuille Abena 10/24/2015, 11:12 AM

## 2015-10-25 DIAGNOSIS — Z6841 Body Mass Index (BMI) 40.0 and over, adult: Secondary | ICD-10-CM | POA: Diagnosis not present

## 2015-10-25 DIAGNOSIS — N179 Acute kidney failure, unspecified: Secondary | ICD-10-CM | POA: Diagnosis not present

## 2015-10-25 DIAGNOSIS — I509 Heart failure, unspecified: Secondary | ICD-10-CM | POA: Diagnosis not present

## 2015-10-25 DIAGNOSIS — R1031 Right lower quadrant pain: Secondary | ICD-10-CM | POA: Diagnosis not present

## 2015-10-25 DIAGNOSIS — G8929 Other chronic pain: Secondary | ICD-10-CM | POA: Diagnosis not present

## 2015-10-25 DIAGNOSIS — I482 Chronic atrial fibrillation: Secondary | ICD-10-CM | POA: Diagnosis not present

## 2015-10-25 DIAGNOSIS — R3981 Functional urinary incontinence: Secondary | ICD-10-CM | POA: Diagnosis not present

## 2015-10-25 DIAGNOSIS — I10 Essential (primary) hypertension: Secondary | ICD-10-CM | POA: Diagnosis not present

## 2015-10-25 DIAGNOSIS — I5040 Unspecified combined systolic (congestive) and diastolic (congestive) heart failure: Secondary | ICD-10-CM | POA: Diagnosis not present

## 2015-10-25 DIAGNOSIS — J9601 Acute respiratory failure with hypoxia: Secondary | ICD-10-CM | POA: Diagnosis not present

## 2015-10-25 DIAGNOSIS — M6281 Muscle weakness (generalized): Secondary | ICD-10-CM | POA: Diagnosis not present

## 2015-10-25 DIAGNOSIS — L98419 Non-pressure chronic ulcer of buttock with unspecified severity: Secondary | ICD-10-CM | POA: Diagnosis not present

## 2015-10-25 DIAGNOSIS — R0782 Intercostal pain: Secondary | ICD-10-CM | POA: Diagnosis not present

## 2015-10-25 DIAGNOSIS — F419 Anxiety disorder, unspecified: Secondary | ICD-10-CM | POA: Diagnosis not present

## 2015-10-25 DIAGNOSIS — M791 Myalgia: Secondary | ICD-10-CM | POA: Diagnosis not present

## 2015-10-25 DIAGNOSIS — I5021 Acute systolic (congestive) heart failure: Secondary | ICD-10-CM | POA: Diagnosis not present

## 2015-10-25 DIAGNOSIS — R296 Repeated falls: Secondary | ICD-10-CM | POA: Diagnosis not present

## 2015-10-25 DIAGNOSIS — N400 Enlarged prostate without lower urinary tract symptoms: Secondary | ICD-10-CM | POA: Diagnosis not present

## 2015-10-25 DIAGNOSIS — J45909 Unspecified asthma, uncomplicated: Secondary | ICD-10-CM | POA: Diagnosis not present

## 2015-10-25 DIAGNOSIS — L98412 Non-pressure chronic ulcer of buttock with fat layer exposed: Secondary | ICD-10-CM | POA: Diagnosis not present

## 2015-10-25 DIAGNOSIS — L89222 Pressure ulcer of left hip, stage 2: Secondary | ICD-10-CM | POA: Diagnosis not present

## 2015-10-25 LAB — COMPREHENSIVE METABOLIC PANEL
ALT: 60 U/L (ref 17–63)
AST: 57 U/L — AB (ref 15–41)
Albumin: 2.4 g/dL — ABNORMAL LOW (ref 3.5–5.0)
Alkaline Phosphatase: 110 U/L (ref 38–126)
Anion gap: 13 (ref 5–15)
BILIRUBIN TOTAL: 0.9 mg/dL (ref 0.3–1.2)
BUN: 46 mg/dL — AB (ref 6–20)
CALCIUM: 8.7 mg/dL — AB (ref 8.9–10.3)
CO2: 31 mmol/L (ref 22–32)
CREATININE: 0.96 mg/dL (ref 0.61–1.24)
Chloride: 98 mmol/L — ABNORMAL LOW (ref 101–111)
Glucose, Bld: 136 mg/dL — ABNORMAL HIGH (ref 65–99)
Potassium: 3.8 mmol/L (ref 3.5–5.1)
Sodium: 142 mmol/L (ref 135–145)
TOTAL PROTEIN: 5.9 g/dL — AB (ref 6.5–8.1)

## 2015-10-25 LAB — GLUCOSE, CAPILLARY
GLUCOSE-CAPILLARY: 143 mg/dL — AB (ref 65–99)
GLUCOSE-CAPILLARY: 202 mg/dL — AB (ref 65–99)
Glucose-Capillary: 194 mg/dL — ABNORMAL HIGH (ref 65–99)

## 2015-10-25 LAB — PROCALCITONIN: Procalcitonin: 0.66 ng/mL

## 2015-10-25 MED ORDER — MORPHINE SULFATE (CONCENTRATE) 20 MG/ML PO SOLN: 5.0000 mg | ORAL | Status: DC | PRN

## 2015-10-25 MED ORDER — ENOXAPARIN SODIUM 80 MG/0.8ML ~~LOC~~ SOLN: 70.0000 mg | SUBCUTANEOUS | Status: DC

## 2015-10-25 MED ORDER — LORAZEPAM 2 MG/ML PO CONC: 1.0000 mg | ORAL | Status: DC | PRN

## 2015-10-25 MED ORDER — FUROSEMIDE 40 MG PO TABS: 40.0000 mg | ORAL_TABLET | Freq: Two times a day (BID) | ORAL | Status: DC

## 2015-10-25 NOTE — Progress Notes (Signed)
Physical Therapy Treatment Patient Details Name: Leonard Montoya MRN: [id number] DOB: [birth year] Today's Date: 10/25/2015    History of Present Illness Patient is a 75 yo male admitted 10/20/15 after being found down by daughter.  Patient with Afib with RVR, sepsis, CAP, UTI, AKI, AMS, CHF, speech deficits.  MRI to r/o CVA pending.    PMH:  HTN, HLD, asthma, Afib, CHF, obesity, on home O2 at 3 l/min    PT Comments    [name] made modest progress today.  Sat EOB x 8 minutes w/ supervision>min assist for balance. AAROM, PROM, and AROM exercises performed sitting EOB.  Pt fatigues quickly. Pt will benefit from continued skilled PT services to increase functional independence and safety.   Follow Up Recommendations  SNF;Supervision/Assistance - 24 hour     Equipment Recommendations  Wheelchair (measurements PT);Wheelchair cushion (measurements PT)    Recommendations for Other Services       Precautions / Restrictions Precautions Precautions: Fall Restrictions Weight Bearing Restrictions: No    Mobility  Bed Mobility Overal bed mobility: Needs Assistance Bed Mobility: Supine to Sit;Sit to Supine Rolling: +2 for physical assistance;Mod assist   Supine to sit: Max assist;+2 for physical assistance Sit to supine: Max assist;+2 for physical assistance   General bed mobility comments: Pt required cues for hand placement to use bed rail and assist for all aspects of bed mobility.    Transfers                 General transfer comment: not safe at this time  Ambulation/Gait                 Stairs            Wheelchair Mobility    Modified Rankin (Stroke Patients Only)       Balance Overall balance assessment: Needs assistance Sitting-balance support: Bilateral upper extremity supported;Feet supported Sitting balance-Leahy Scale: Poor Sitting balance - Comments: pt sat EOB with variance of assist from supervision to minA.  Cues for hand placement and  upright posture while AROM/PROM was performed. Postural control: Posterior lean                          Cognition Arousal/Alertness: Awake/alert Behavior During Therapy: Flat affect Overall Cognitive Status: Impaired/Different from baseline Area of Impairment: Orientation;Attention;Following commands;Problem solving Orientation Level: Disoriented to;Time;Situation;Place Current Attention Level: Sustained   Following Commands: Follows one step commands with increased time     Problem Solving: Slow processing;Difficulty sequencing;Requires verbal cues;Requires tactile cues General Comments: pt extremely delayed with both verbal and physical response, useful to use short precise sentences    Exercises General Exercises - Lower Extremity Ankle Circles/Pumps: AROM;Both;10 reps;Seated Long Arc Quad: AAROM;Both;10 reps;Seated Other Exercises Other Exercises: Bil knee extension eccentric exercises w/ assist    General Comments        Pertinent Vitals/Pain Pain Assessment: Faces Faces Pain Scale: Hurts little more Pain Location: grimacing when rolling to Rt Pain Descriptors / Indicators: Grimacing Pain Intervention(s): Limited activity within patient's tolerance;Monitored during session;Repositioned    Home Living                      Prior Function            PT Goals (current goals can now be found in the care plan section) Acute Rehab PT Goals Patient Stated Goal: to sit up PT Goal Formulation: With patient Time For Goal Achievement:  11/04/15 Potential to Achieve Goals: Fair Progress towards PT goals: Progressing toward goals (modestly)    Frequency  Min 2X/week    PT Plan Current plan remains appropriate    Co-evaluation             End of Session Equipment Utilized During Treatment: Oxygen Activity Tolerance: Patient tolerated treatment well;Patient limited by fatigue Patient left: in bed;with call bell/phone within reach     Time:  [id number] PT Time Calculation (min) (ACUTE ONLY): 24 min  Charges:  $Therapeutic Exercise: 8-22 mins $Therapeutic Activity: 8-22 mins                    G Codes:      Encarnacion ChuAshley Dwight Adamczak PT, DPT  Pager: [phone] Phone: [phone] 10/25/2015, 3:55 PM

## 2015-10-25 NOTE — Progress Notes (Signed)
[facility].  Leonard Montoya LCSWA [phone]

## 2015-10-25 NOTE — Clinical Social Work Placement (Signed)
   CLINICAL SOCIAL WORK PLACEMENT  NOTE  Date:  10/25/2015  Patient Details  Name: Leonard Montoya MRN: [id number] Date of Birth: 12/25/1940  Clinical Social Work is seeking post-discharge placement for this patient at the Skilled  Nursing Facility level of care (*CSW will initial, date and re-position this form in  chart as items are completed):  Yes   Patient/family provided with [place] Clinical Social Work Department's list of facilities offering this level of care within the geographic area requested by the patient (or if unable, by the patient's family).  Yes   Patient/family informed of their freedom to choose among providers that offer the needed level of care, that participate in Medicare, Medicaid or managed care program needed by the patient, have an available bed and are willing to accept the patient.  Yes   Patient/family informed of [facility]'s ownership interest in [facility] and [facility], as well as of the fact that they are under no obligation to receive care at these facilities.  PASRR submitted to EDS on 10/25/15     PASRR number received on 10/25/15     Existing PASRR number confirmed on       FL2 transmitted to all facilities in geographic area requested by pt/family on 10/23/15     FL2 transmitted to all facilities within larger geographic area on       Patient informed that his/her managed care company has contracts with or will negotiate with certain facilities, including the following:        Yes   Patient/family informed of bed offers received.  [facility]     Physician recommends and patient chooses bed at      [facility] 10[facility]25/15.  Patient to be transferred to facility by PTAR     Patient family notified on 10/25/15 of transfer.  Name of family member notified:  Daughter     PHYSICIAN Please sign FL2, Please prepare prescriptions     Additional Comment:     _______________________________________________ Mearl LatinNadia S Field Staniszewski, LCSWA 10/25/2015, 5:01 PM

## 2015-10-25 NOTE — Progress Notes (Signed)
Report attempted to RN at [facility]. Phone number left with [employer] operational officersecretary.

## 2015-10-25 NOTE — Discharge Summary (Signed)
Physician Discharge Summary  Leonard Montoya MRN: [id number] DOB/AGE: 77-01-1941 77 y.o.  PCP: No PCP Per Patient   Admit date: 10/20/2015 Discharge date: 10/25/2015  Discharge Diagnoses:     Active Problems:   Essential hypertension, benign   Atrial fibrillation with rapid ventricular response (HCC)   Sepsis ([place])   AKI (acute kidney injury) ([place])   Acute CHF (congestive heart failure) ([place])   Thrombocytopenia (HCC)   CAP (community acquired pneumonia)   Acute congestive heart failure (HCC)   Pressure ulcer   Shortness of breath   Encounter for palliative care   Goals of care, counseling/discussion Chronic normal pressure hydrocephalus Proteus mirabilis urinary tract infection    Follow-up recommendations  Patient being discharged to residential hospice when bed available      Medication List    STOP taking these medications        blood glucose meter kit and supplies Kit     doxazosin 4 MG tablet  Commonly known as:  CARDURA     simvastatin 40 MG tablet  Commonly known as:  ZOCOR     spironolactone 100 MG tablet  Commonly known as:  ALDACTONE     sulfamethoxazole-trimethoprim 800-160 MG per tablet  Commonly known as:  BACTRIM DS     torsemide 20 MG tablet  Commonly known as:  DEMADEX     warfarin 5 MG tablet  Commonly known as:  COUMADIN      TAKE these medications        albuterol 108 (90 Base) MCG/ACT inhaler  Commonly known as:  PROVENTIL HFA;VENTOLIN HFA  Inhale 1-2 puffs into the lungs every 6 (six) hours as needed for wheezing.     diltiazem 180 MG 24 hr capsule  Commonly known as:  CARDIZEM CD  Take 1 capsule (180 mg total) by mouth daily.     furosemide 40 MG tablet  Commonly known as:  LASIX  Take 1 tablet (40 mg total) by mouth 2 (two) times daily.     LORazepam 2 MG/ML concentrated solution  Commonly known as:  ATIVAN  Take 0.5 mLs (1 mg total) by mouth every 4 (four) hours as needed for anxiety.     morphine 20 MG/ML concentrated  solution  Commonly known as:  ROXANOL  Take 0.25 mLs (5 mg total) by mouth every 2 (two) hours as needed for severe pain.     tamsulosin 0.4 MG Caps capsule  Commonly known as:  FLOMAX  Take 1 capsule (0.4 mg total) by mouth daily.         Discharge Condition: *Prognosis poor       Allergies  Allergen Reactions  . Sudafed [Pseudoephedrine Hcl]     Weird Dreams      Disposition: 01-Home or Self Care   Consults: Palliative care     Significant Diagnostic Studies:  Ct Head Wo Contrast  10/21/2015  CLINICAL DATA:  Found down. Past medical history of hypertension, asthma and atrial fibrillation. EXAM: CT HEAD WITHOUT CONTRAST TECHNIQUE: Contiguous axial images were obtained from the base of the skull through the vertex without intravenous contrast. COMPARISON:  None. FINDINGS: Brain: There is generalized brain atrophy with commensurate dilatation of the sulci. Moderate ventriculomegaly is slightly out of proportion to the degree of brain atrophy. There is no mass, hemorrhage, edema or other evidence of acute parenchymal abnormality. No extra-axial hemorrhage. Vascular: No hyperdense vessel or unexpected calcification. Skull: Negative for fracture or focal lesion. Sinuses/Orbits: No acute findings. Other: None. IMPRESSION:  1. No acute findings. No intracranial mass, hemorrhage or edema. No skull fracture. 2. Moderate ventriculomegaly which is slightly out of proportion to the degree of brain atrophy raising the possibility of a chronic normal pressure hydrocephalus. Electronically Signed   By: Franki Cabot M.D.   On: 10/21/2015 21:44   Dg Chest Port 1 View  10/23/2015  CLINICAL DATA:  Shortness of breath, asthma, hypertension, atrial fibrillation. EXAM: PORTABLE CHEST 1 VIEW COMPARISON:  10/21/2015. FINDINGS: Cardiomegaly persists. Overall improved edema. No effusion or pneumothorax. No osseous findings. IMPRESSION: Improved aeration.  Cardiomegaly. Electronically Signed   By: Staci Righter M.D.   On: 10/23/2015 10:24   Dg Chest Portable 1 View  10/21/2015  CLINICAL DATA:  Acute onset of shortness of breath, generalized weakness and altered mental status. Initial encounter. EXAM: PORTABLE CHEST 1 VIEW COMPARISON:  Chest radiograph performed 09/12/2015 FINDINGS: The lungs are well-aerated. Vascular congestion is noted. Increased interstitial markings may reflect mild interstitial edema. Mild right basilar pneumonia cannot be excluded. No pleural effusion or pneumothorax is seen. The cardiomediastinal silhouette is enlarged. No acute osseous abnormalities are seen. IMPRESSION: Vascular congestion and cardiomegaly. Increased interstitial markings may reflect mild interstitial edema. Mild right basilar pneumonia cannot be excluded. Electronically Signed   By: Garald Balding M.D.   On: 10/21/2015 00:20        Filed Weights   10/23/15 0323 10/24/15 0401 10/25/15 0545  Weight: 151.501 kg (334 lb) 146.058 kg (322 lb) 143.337 kg (316 lb)     Microbiology: Recent Results (from the past 240 hour(s))  MRSA PCR Screening     Status: None   Collection Time: 10/21/15  4:34 AM  Result Value Ref Range Status   MRSA by PCR NEGATIVE NEGATIVE Final    Comment:        The GeneXpert MRSA Assay (FDA approved for NASAL specimens only), is one component of a comprehensive MRSA colonization surveillance program. It is not intended to diagnose MRSA infection nor to guide or monitor treatment for MRSA infections.   Culture, Urine     Status: None   Collection Time: 10/21/15  7:59 AM  Result Value Ref Range Status   Specimen Description URINE, CLEAN CATCH  Final   Special Requests NONE  Final   Culture 50,000 COLONIES/mL PROTEUS MIRABILIS  Final   Report Status 10/23/2015 FINAL  Final   Organism ID, Bacteria PROTEUS MIRABILIS  Final      Susceptibility   Proteus mirabilis - MIC*    AMPICILLIN <=2 SENSITIVE Sensitive     CEFAZOLIN <=4 SENSITIVE Sensitive     CEFTRIAXONE <=1  SENSITIVE Sensitive     CIPROFLOXACIN <=0.25 SENSITIVE Sensitive     GENTAMICIN <=1 SENSITIVE Sensitive     IMIPENEM <=0.25 SENSITIVE Sensitive     NITROFURANTOIN 64 RESISTANT Resistant     TRIMETH/SULFA <=20 SENSITIVE Sensitive     AMPICILLIN/SULBACTAM <=2 SENSITIVE Sensitive     PIP/TAZO <=4 SENSITIVE Sensitive     * 50,000 COLONIES/mL PROTEUS MIRABILIS  Culture, blood (Routine X 2) w Reflex to ID Panel     Status: None (Preliminary result)   Collection Time: 10/21/15 12:00 PM  Result Value Ref Range Status   Specimen Description BLOOD RIGHT HAND  Final   Special Requests BOTTLES DRAWN AEROBIC ONLY 5CC  Final   Culture NO GROWTH 3 DAYS  Final   Report Status PENDING  Incomplete  Culture, blood (Routine X 2) w Reflex to ID Panel     Status:  None (Preliminary result)   Collection Time: 10/21/15 12:15 PM  Result Value Ref Range Status   Specimen Description BLOOD RIGHT HAND  Final   Special Requests IN PEDIATRIC BOTTLE 5CC  Final   Culture NO GROWTH 3 DAYS  Final   Report Status PENDING  Incomplete       Blood Culture    Component Value Date/Time   SDES BLOOD RIGHT HAND 10/21/2015 1215   SPECREQUEST IN PEDIATRIC BOTTLE 5CC 10/21/2015 1215   CULT NO GROWTH 3 DAYS 10/21/2015 1215   REPTSTATUS PENDING 10/21/2015 1215      Labs: Results for orders placed or performed during the hospital encounter of 10/20/15 (from the past 48 hour(s))  Glucose, capillary     Status: Abnormal   Collection Time: 10/23/15 11:21 AM  Result Value Ref Range   Glucose-Capillary 200 (H) 65 - 99 mg/dL  Glucose, capillary     Status: Abnormal   Collection Time: 10/23/15  4:56 PM  Result Value Ref Range   Glucose-Capillary 296 (H) 65 - 99 mg/dL  Glucose, capillary     Status: Abnormal   Collection Time: 10/23/15  9:34 PM  Result Value Ref Range   Glucose-Capillary 268 (H) 65 - 99 mg/dL   Comment 1 Notify RN    Comment 2 Document in Chart   Comprehensive metabolic panel     Status: Abnormal    Collection Time: 10/24/15  3:56 AM  Result Value Ref Range   Sodium 135 135 - 145 mmol/L   Potassium 4.0 3.5 - 5.1 mmol/L   Chloride 95 (L) 101 - 111 mmol/L   CO2 26 22 - 32 mmol/L   Glucose, Bld 162 (H) 65 - 99 mg/dL   BUN 60 (H) 6 - 20 mg/dL   Creatinine, Ser 1.07 0.61 - 1.24 mg/dL   Calcium 8.6 (L) 8.9 - 10.3 mg/dL   Total Protein 5.7 (L) 6.5 - 8.1 g/dL   Albumin 2.3 (L) 3.5 - 5.0 g/dL   AST 47 (H) 15 - 41 U/L   ALT 49 17 - 63 U/L   Alkaline Phosphatase 124 38 - 126 U/L   Total Bilirubin 1.0 0.3 - 1.2 mg/dL   GFR calc non Af Amer >60 >60 mL/min   GFR calc Af Amer >60 >60 mL/min    Comment: (NOTE) The eGFR has been calculated using the CKD EPI equation. This calculation has not been validated in all clinical situations. eGFR's persistently <60 mL/min signify possible Chronic Kidney Disease.    Anion gap 14 5 - 15  CBC     Status: Abnormal   Collection Time: 10/24/15  3:56 AM  Result Value Ref Range   WBC 16.8 (H) 4.0 - 10.5 K/uL   RBC 4.75 4.22 - 5.81 MIL/uL   Hemoglobin 13.8 13.0 - 17.0 g/dL   HCT 42.9 39.0 - 52.0 %   MCV 90.3 78.0 - 100.0 fL   MCH 29.1 26.0 - 34.0 pg   MCHC 32.2 30.0 - 36.0 g/dL   RDW 13.8 11.5 - 15.5 %   Platelets 148 (L) 150 - 400 K/uL  Glucose, capillary     Status: Abnormal   Collection Time: 10/24/15  7:35 AM  Result Value Ref Range   Glucose-Capillary 165 (H) 65 - 99 mg/dL   Comment 1 Notify RN    Comment 2 Document in Chart   Glucose, capillary     Status: Abnormal   Collection Time: 10/24/15 11:13 AM  Result Value Ref Range  Glucose-Capillary 193 (H) 65 - 99 mg/dL  Glucose, capillary     Status: Abnormal   Collection Time: 10/24/15  4:29 PM  Result Value Ref Range   Glucose-Capillary 210 (H) 65 - 99 mg/dL  Glucose, capillary     Status: Abnormal   Collection Time: 10/24/15  8:19 PM  Result Value Ref Range   Glucose-Capillary 206 (H) 65 - 99 mg/dL  Procalcitonin     Status: None   Collection Time: 10/25/15  5:34 AM  Result Value  Ref Range   Procalcitonin 0.66 ng/mL    Comment:        Interpretation: PCT > 0.5 ng/mL and <= 2 ng/mL: Systemic infection (sepsis) is possible, but other conditions are known to elevate PCT as well. (NOTE)         ICU PCT Algorithm               Non ICU PCT Algorithm    ----------------------------     ------------------------------         PCT < 0.25 ng/mL                 PCT < 0.1 ng/mL     Stopping of antibiotics            Stopping of antibiotics       strongly encouraged.               strongly encouraged.    ----------------------------     ------------------------------       PCT level decrease by               PCT < 0.25 ng/mL       >= 80% from peak PCT       OR PCT 0.25 - 0.5 ng/mL          Stopping of antibiotics                                             encouraged.     Stopping of antibiotics           encouraged.    ----------------------------     ------------------------------       PCT level decrease by              PCT >= 0.25 ng/mL       < 80% from peak PCT        AND PCT >= 0.5 ng/mL             Continuing antibiotics                                              encouraged.       Continuing antibiotics            encouraged.    ----------------------------     ------------------------------     PCT level increase compared          PCT > 0.5 ng/mL         with peak PCT AND          PCT >= 0.5 ng/mL             Escalation of antibiotics  strongly encouraged.      Escalation of antibiotics        strongly encouraged.   Comprehensive metabolic panel     Status: Abnormal   Collection Time: 10/25/15  5:34 AM  Result Value Ref Range   Sodium 142 135 - 145 mmol/L    Comment: DELTA CHECK NOTED   Potassium 3.8 3.5 - 5.1 mmol/L   Chloride 98 (L) 101 - 111 mmol/L   CO2 31 22 - 32 mmol/L   Glucose, Bld 136 (H) 65 - 99 mg/dL   BUN 46 (H) 6 - 20 mg/dL   Creatinine, Ser 0.96 0.61 - 1.24 mg/dL   Calcium 8.7 (L) 8.9 - 10.3  mg/dL   Total Protein 5.9 (L) 6.5 - 8.1 g/dL   Albumin 2.4 (L) 3.5 - 5.0 g/dL   AST 57 (H) 15 - 41 U/L   ALT 60 17 - 63 U/L   Alkaline Phosphatase 110 38 - 126 U/L   Total Bilirubin 0.9 0.3 - 1.2 mg/dL   GFR calc non Af Amer >60 >60 mL/min   GFR calc Af Amer >60 >60 mL/min    Comment: (NOTE) The eGFR has been calculated using the CKD EPI equation. This calculation has not been validated in all clinical situations. eGFR's persistently <60 mL/min signify possible Chronic Kidney Disease.    Anion gap 13 5 - 15  Glucose, capillary     Status: Abnormal   Collection Time: 10/25/15  8:05 AM  Result Value Ref Range   Glucose-Capillary 143 (H) 65 - 99 mg/dL   Comment 1 Notify RN    Comment 2 Document in Chart      Lipid Panel     Component Value Date/Time   CHOL 163 09/27/2013 1643   TRIG 165* 09/27/2013 1643   HDL 45 09/27/2013 1643   CHOLHDL 3.6 09/27/2013 1643   VLDL 33 09/27/2013 1643   LDLCALC 85 09/27/2013 1643     Lab Results  Component Value Date   HGBA1C 6.3* 10/23/2015   HGBA1C 6.0* 10/21/2015   HGBA1C 6.8* 09/27/2013     Lab Results  Component Value Date   LDLCALC 85 09/27/2013   CREATININE 0.96 10/25/2015    75 y.o. male with PMH of hypertension, hyperlipidemia, asthma, and atrial fibrillation who is brought in to the ED after being found down by his daughter. Patient is chronically ill and, unfortunately, has not been adherent to his medical treatment plan. He was found by his daughter to be lying on the floor unresponsive with foul urine odor. He was arousable, but per report, very weak and lethargic. He is prescribed 3 L/m supplemental oxygen around the clock. He was not wearing his oxygen upon EMS arrival. He was found to have wheezes throughout and was given DuoNeb treatment en route to the hospital. Patient has had gradual progressive decline for several months-years now. At present, he is admitted because of sepsis secondary to community-acquired  pneumonia versus urinary tract infection. Additionally, acute on chronic congestive heart failure, known history of atrial fibrillation Hospital course complicated by rapid ventricular response. Hospital course initially also complicated by acute kidney injury. 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 Questionable additional neurological insult such as a cerebrovascular accident. Patient's body habitus not conducive to obtaining a MRI of the brain. It is reported that the patient was prescribed oral anticoagulation with Coumadin for his atrial fibrillation but did not take it regularly and did not like to  go for INR monitoring to Coumadin clinics.  Assessment and plan 1. Sepsis suspected secondary to CAP /UTI - Met sepsis criteria on admission with leukocytosis, tachypnea, tachycardia, suspected PNA +/- UTI (UA remains pending) , markedly elevated procalcitonin , lactic acid on admission,  - Treated with empiric Rocephin and azithromycin in ED, antibiotics discontinued as POA has chosen to make him comfortable     2. Acute on chronic congestive heart failure /abnormal troponin - Presents with peripheral edema, vascular congestion on CXR, elevated BNP  Initiated with IV Lasix, this was discontinued in the setting of acute kidney injury however 2-D echo shows that the patient's EF is 40-45% Continue Lasix for comfort Abnormal troponin likely the setting of demand ischemia, trending upwards Discussed with the patient's daughter does not want any aggressive measures at this time She has declined cardiology consultation Holding Demadex, Aldactone,   3. Atrial fibrillation with RVR  - CHADS-VASc is 26 (age x2, CHF, HTN) , on Cardizem CD at home - Prescribed coumadin, but does not take due to inability to get to clinic appointments to monitor INR Offered Eliquis as an alternative but patient's daughter is not interested in  starting anticoagulation at this time - Considered heparin gtt, but holding for now given thrombocytopenia  - RVR present on arrival, initiated on diltiazem infusion; now transition to by mouth Cardizem, continue IV digoxin. Continue Cardizem   4. AKI - SCr 1.41 on admission, up from apparent baseline of 0.87 - Suspect this is secondary to acute infection , low EF Resume low-dose diuretics , no further labs  5. Thrombocytopenia  - Platelet count 97,000 improving, likely secondary to sepsis - Anticipate improvement with treatment of infectious process  Follow CBC   6. 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 Patient is awake but unable to follow commands or speak in meaningful sentences  MRI of the brain to rule out CVA in the setting of his atrial fibrillation , this was attempted but could not be done due to over the weight limit , CT of the head did not show any acute findings, ventriculomegaly concerning for chronic normal pressure hydrocephalus Speech therapy evaluation Regular;Thin liquid PT OT consultation recommended SNF, daughter has chosen residential hospice for him , patient and family do not want any further workup Patient's daughter has declined neurology/neurosurgical evaluation    Discharge Exam:    Blood pressure 143/92, pulse 97, temperature 97.4 F (36.3 C), temperature source Oral, resp. rate 16, height _0  (1.88 m), weight 143.337 kg (316 lb), SpO2 99 %.        SignedReyne Dumas 10/25/2015, 10:24 AM        Time spent >45 mins

## 2015-10-25 NOTE — Progress Notes (Signed)
Patient will DC to: [facility]([employer] authorization # given) Anticipated DC date: 10/25/15 Family notified: Daughter Transport by: [facility]  CSW signing off.  Cristobal GoldmannNadia Iliyah Bui, [place] Clinical Social Worker [phone]

## 2015-10-25 NOTE — Care Management Note (Signed)
Case Management Note  Patient Details  Name: Fran LowesJoel A Leamy MRN: [id number] Date of Birth: 10/27/1940  Subjective/Objective: Pt admitted for of sepsis secondary to community-acquired pneumonia versus urinary tract infection. Additionally pt with acute on chronic CHF and A fib RVR. Hospital course initially also complicated by acute kidney injury. Pt with expressive aphasia, probable chronic normal pressure hydrocephalus. Pt is from home alone. Palliative Care Consulted.   Action/Plan: CSW consulted for SNF vs Residential Hospice. [facility] assesing to see if pt is appropriate. No further needs from CM at this time.    Expected Discharge Date:                  Expected Discharge Plan:  Hospice Medical Facility  In-House Referral:  Clinical Social Work  Discharge planning Services  CM Consult  Post Acute Care Choice:  NA Choice offered to:  NA  DME Arranged:  N/A DME Agency:  NA  HH Arranged:  NA HH Agency:  NA  Status of Service:  Completed, signed off  Medicare Important Message Given:  Yes Date Medicare IM Given:    Medicare IM give by:    Date Additional Medicare IM Given:    Additional Medicare Important Message give by:     If discussed at Long Length of Stay Meetings, dates discussed:    Additional Comments:  Gala LewandowskyGraves-Bigelow, Judieth Mckown Kaye, RN 10/25/2015, 11:20 AM

## 2015-10-25 NOTE — Progress Notes (Signed)
Report given to StatisticianCrystal RN at [facility]. Pts VSS. discharging via [facility]. Awaiting social work call.

## 2015-10-25 NOTE — Progress Notes (Signed)
PASR given to [facility]: [id number]  Loyal Bubaadia Thalia [place]kington LCSWA [phone]

## 2015-10-25 NOTE — Consult Note (Signed)
   [facility]   10/25/2015  Fran LowesJoel A [place] 06/19/1941 [id number]   [facility]. Chart reviewed. Noted disposition plans are for hospice facility. Will not attempt to contact daughter [facility] follow up at this time due to disposition plans.   Raiford NobleAtika Arieliz Latino, MSN-Ed, RN,BSN [facility] Care Management Hospital Liaison [phone]

## 2015-10-26 DIAGNOSIS — J9601 Acute respiratory failure with hypoxia: Secondary | ICD-10-CM | POA: Diagnosis not present

## 2015-10-26 DIAGNOSIS — I482 Chronic atrial fibrillation: Secondary | ICD-10-CM | POA: Diagnosis not present

## 2015-10-26 DIAGNOSIS — I10 Essential (primary) hypertension: Secondary | ICD-10-CM | POA: Diagnosis not present

## 2015-10-26 DIAGNOSIS — J45909 Unspecified asthma, uncomplicated: Secondary | ICD-10-CM | POA: Diagnosis not present

## 2015-10-26 LAB — CULTURE, BLOOD (ROUTINE X 2)
Culture: NO GROWTH
Culture: NO GROWTH

## 2015-10-30 ENCOUNTER — Telehealth: Payer: Self-pay | Admitting: *Deleted

## 2015-10-30 NOTE — Telephone Encounter (Signed)
Received fax from [facility] Discharge Dispostion stating above pt has been discharged from hospital  Date of admit:10/21/15  Date of discharge: 10/25/15  Level of Care: Inpatient Acute Medical Care  [facility]  Attending physician: Lucinda DellAbrol,Nayana,MD  PCP: Ethlyn DanielsWarren Pickard/Mary Beth Dixon,PA  Discharge Dispostion: Discharged/Transferred to SNF with Medicare certification  DX:I48.91- Unspecified atrial fibrillation

## 2015-11-01 DIAGNOSIS — G8929 Other chronic pain: Secondary | ICD-10-CM | POA: Diagnosis not present

## 2015-11-01 DIAGNOSIS — I10 Essential (primary) hypertension: Secondary | ICD-10-CM | POA: Diagnosis not present

## 2015-11-01 DIAGNOSIS — I5040 Unspecified combined systolic (congestive) and diastolic (congestive) heart failure: Secondary | ICD-10-CM | POA: Diagnosis not present

## 2015-11-01 DIAGNOSIS — Z6841 Body Mass Index (BMI) 40.0 and over, adult: Secondary | ICD-10-CM | POA: Diagnosis not present

## 2015-11-14 DIAGNOSIS — L98419 Non-pressure chronic ulcer of buttock with unspecified severity: Secondary | ICD-10-CM | POA: Diagnosis not present

## 2015-11-15 DIAGNOSIS — F419 Anxiety disorder, unspecified: Secondary | ICD-10-CM | POA: Diagnosis not present

## 2015-11-15 DIAGNOSIS — I5040 Unspecified combined systolic (congestive) and diastolic (congestive) heart failure: Secondary | ICD-10-CM | POA: Diagnosis not present

## 2015-11-15 DIAGNOSIS — N400 Enlarged prostate without lower urinary tract symptoms: Secondary | ICD-10-CM | POA: Diagnosis not present

## 2015-11-15 DIAGNOSIS — R1031 Right lower quadrant pain: Secondary | ICD-10-CM | POA: Diagnosis not present

## 2015-11-15 DIAGNOSIS — I10 Essential (primary) hypertension: Secondary | ICD-10-CM | POA: Diagnosis not present

## 2015-11-15 DIAGNOSIS — I482 Chronic atrial fibrillation: Secondary | ICD-10-CM | POA: Diagnosis not present

## 2015-11-17 DIAGNOSIS — M791 Myalgia: Secondary | ICD-10-CM | POA: Diagnosis not present

## 2015-11-29 DIAGNOSIS — R0782 Intercostal pain: Secondary | ICD-10-CM | POA: Diagnosis not present

## 2015-12-01 DIAGNOSIS — I482 Chronic atrial fibrillation: Secondary | ICD-10-CM | POA: Diagnosis not present

## 2015-12-01 DIAGNOSIS — I5040 Unspecified combined systolic (congestive) and diastolic (congestive) heart failure: Secondary | ICD-10-CM | POA: Diagnosis not present

## 2015-12-01 DIAGNOSIS — M6281 Muscle weakness (generalized): Secondary | ICD-10-CM | POA: Diagnosis not present

## 2015-12-01 DIAGNOSIS — I10 Essential (primary) hypertension: Secondary | ICD-10-CM | POA: Diagnosis not present

## 2015-12-05 DIAGNOSIS — L89222 Pressure ulcer of left hip, stage 2: Secondary | ICD-10-CM | POA: Diagnosis not present

## 2015-12-05 DIAGNOSIS — I110 Hypertensive heart disease with heart failure: Secondary | ICD-10-CM | POA: Diagnosis not present

## 2015-12-05 DIAGNOSIS — J45909 Unspecified asthma, uncomplicated: Secondary | ICD-10-CM | POA: Diagnosis not present

## 2015-12-05 DIAGNOSIS — M1991 Primary osteoarthritis, unspecified site: Secondary | ICD-10-CM | POA: Diagnosis not present

## 2015-12-05 DIAGNOSIS — R296 Repeated falls: Secondary | ICD-10-CM | POA: Diagnosis not present

## 2015-12-05 DIAGNOSIS — F419 Anxiety disorder, unspecified: Secondary | ICD-10-CM | POA: Diagnosis not present

## 2015-12-05 DIAGNOSIS — M6281 Muscle weakness (generalized): Secondary | ICD-10-CM | POA: Diagnosis not present

## 2015-12-05 DIAGNOSIS — I482 Chronic atrial fibrillation: Secondary | ICD-10-CM | POA: Diagnosis not present

## 2015-12-05 DIAGNOSIS — I5042 Chronic combined systolic (congestive) and diastolic (congestive) heart failure: Secondary | ICD-10-CM | POA: Diagnosis not present

## 2015-12-07 DIAGNOSIS — J45909 Unspecified asthma, uncomplicated: Secondary | ICD-10-CM | POA: Diagnosis not present

## 2015-12-07 DIAGNOSIS — L89222 Pressure ulcer of left hip, stage 2: Secondary | ICD-10-CM | POA: Diagnosis not present

## 2015-12-07 DIAGNOSIS — M1991 Primary osteoarthritis, unspecified site: Secondary | ICD-10-CM | POA: Diagnosis not present

## 2015-12-07 DIAGNOSIS — I482 Chronic atrial fibrillation: Secondary | ICD-10-CM | POA: Diagnosis not present

## 2015-12-07 DIAGNOSIS — R296 Repeated falls: Secondary | ICD-10-CM | POA: Diagnosis not present

## 2015-12-07 DIAGNOSIS — M6281 Muscle weakness (generalized): Secondary | ICD-10-CM | POA: Diagnosis not present

## 2015-12-07 DIAGNOSIS — I110 Hypertensive heart disease with heart failure: Secondary | ICD-10-CM | POA: Diagnosis not present

## 2015-12-07 DIAGNOSIS — I5042 Chronic combined systolic (congestive) and diastolic (congestive) heart failure: Secondary | ICD-10-CM | POA: Diagnosis not present

## 2015-12-07 DIAGNOSIS — F419 Anxiety disorder, unspecified: Secondary | ICD-10-CM | POA: Diagnosis not present

## 2015-12-08 DIAGNOSIS — I482 Chronic atrial fibrillation: Secondary | ICD-10-CM | POA: Diagnosis not present

## 2015-12-08 DIAGNOSIS — F419 Anxiety disorder, unspecified: Secondary | ICD-10-CM | POA: Diagnosis not present

## 2015-12-08 DIAGNOSIS — J45909 Unspecified asthma, uncomplicated: Secondary | ICD-10-CM | POA: Diagnosis not present

## 2015-12-08 DIAGNOSIS — L89222 Pressure ulcer of left hip, stage 2: Secondary | ICD-10-CM | POA: Diagnosis not present

## 2015-12-08 DIAGNOSIS — I5042 Chronic combined systolic (congestive) and diastolic (congestive) heart failure: Secondary | ICD-10-CM | POA: Diagnosis not present

## 2015-12-08 DIAGNOSIS — E639 Nutritional deficiency, unspecified: Secondary | ICD-10-CM | POA: Diagnosis not present

## 2015-12-08 DIAGNOSIS — M1991 Primary osteoarthritis, unspecified site: Secondary | ICD-10-CM | POA: Diagnosis not present

## 2015-12-08 DIAGNOSIS — I110 Hypertensive heart disease with heart failure: Secondary | ICD-10-CM | POA: Diagnosis not present

## 2015-12-08 DIAGNOSIS — M6281 Muscle weakness (generalized): Secondary | ICD-10-CM | POA: Diagnosis not present

## 2015-12-08 DIAGNOSIS — R296 Repeated falls: Secondary | ICD-10-CM | POA: Diagnosis not present

## 2015-12-12 ENCOUNTER — Other Ambulatory Visit: Payer: Self-pay

## 2015-12-12 NOTE — Patient Outreach (Signed)
[facility]) Care Management  12/12/2015  Leonard LowesJoel A Montoya 11/21/1940 [id number]   Telephone Screen  Referral Date: 12/11/15 Referral Source: [facility] Referral Reason: " "dtr(Sandra) requesting urgent CM assistance for her father, states his health is deteriorating, member homebound and requires around the clock care, caregiver completley overwhelmed"    Outreach attempt #1 to patient/dtr. Spoke with dtr(POA). Briefly discussed referral. Dtr states that she was finally able to find patient a MD that makes home visits to see patient as its too difficult to get patient out of the home. New PCP is Dr. Annita BrodPhilip Asenso and home visit was made on 12/08/15. [facility] different practice. Advised dtr that RN CM would have to first verify if MD [facility] before [facility] services can be provided to patient.     Plan: RN CM will clarify if new PCP is [facility] and provide update to patient/dtr.    Antionette Fairyoshanda Hilmer Aliberti, RN,BSN,CCM [facility] Care Management Telephonic Care Management Coordinator Direct Phone: [phone] Toll Free: [phone] Fax: [phone]

## 2015-12-13 DIAGNOSIS — J45909 Unspecified asthma, uncomplicated: Secondary | ICD-10-CM | POA: Diagnosis not present

## 2015-12-13 DIAGNOSIS — L89222 Pressure ulcer of left hip, stage 2: Secondary | ICD-10-CM | POA: Diagnosis not present

## 2015-12-13 DIAGNOSIS — I110 Hypertensive heart disease with heart failure: Secondary | ICD-10-CM | POA: Diagnosis not present

## 2015-12-13 DIAGNOSIS — M6281 Muscle weakness (generalized): Secondary | ICD-10-CM | POA: Diagnosis not present

## 2015-12-13 DIAGNOSIS — M1991 Primary osteoarthritis, unspecified site: Secondary | ICD-10-CM | POA: Diagnosis not present

## 2015-12-13 DIAGNOSIS — I5042 Chronic combined systolic (congestive) and diastolic (congestive) heart failure: Secondary | ICD-10-CM | POA: Diagnosis not present

## 2015-12-13 DIAGNOSIS — R296 Repeated falls: Secondary | ICD-10-CM | POA: Diagnosis not present

## 2015-12-13 DIAGNOSIS — F419 Anxiety disorder, unspecified: Secondary | ICD-10-CM | POA: Diagnosis not present

## 2015-12-13 DIAGNOSIS — I482 Chronic atrial fibrillation: Secondary | ICD-10-CM | POA: Diagnosis not present

## 2015-12-14 DIAGNOSIS — F419 Anxiety disorder, unspecified: Secondary | ICD-10-CM | POA: Diagnosis not present

## 2015-12-14 DIAGNOSIS — M6281 Muscle weakness (generalized): Secondary | ICD-10-CM | POA: Diagnosis not present

## 2015-12-14 DIAGNOSIS — I5042 Chronic combined systolic (congestive) and diastolic (congestive) heart failure: Secondary | ICD-10-CM | POA: Diagnosis not present

## 2015-12-14 DIAGNOSIS — R296 Repeated falls: Secondary | ICD-10-CM | POA: Diagnosis not present

## 2015-12-14 DIAGNOSIS — I110 Hypertensive heart disease with heart failure: Secondary | ICD-10-CM | POA: Diagnosis not present

## 2015-12-14 DIAGNOSIS — M1991 Primary osteoarthritis, unspecified site: Secondary | ICD-10-CM | POA: Diagnosis not present

## 2015-12-14 DIAGNOSIS — I482 Chronic atrial fibrillation: Secondary | ICD-10-CM | POA: Diagnosis not present

## 2015-12-14 DIAGNOSIS — L89222 Pressure ulcer of left hip, stage 2: Secondary | ICD-10-CM | POA: Diagnosis not present

## 2015-12-14 DIAGNOSIS — J45909 Unspecified asthma, uncomplicated: Secondary | ICD-10-CM | POA: Diagnosis not present

## 2015-12-15 DIAGNOSIS — F419 Anxiety disorder, unspecified: Secondary | ICD-10-CM | POA: Diagnosis not present

## 2015-12-15 DIAGNOSIS — M6281 Muscle weakness (generalized): Secondary | ICD-10-CM | POA: Diagnosis not present

## 2015-12-15 DIAGNOSIS — I482 Chronic atrial fibrillation: Secondary | ICD-10-CM | POA: Diagnosis not present

## 2015-12-15 DIAGNOSIS — L89222 Pressure ulcer of left hip, stage 2: Secondary | ICD-10-CM | POA: Diagnosis not present

## 2015-12-15 DIAGNOSIS — M1991 Primary osteoarthritis, unspecified site: Secondary | ICD-10-CM | POA: Diagnosis not present

## 2015-12-15 DIAGNOSIS — R296 Repeated falls: Secondary | ICD-10-CM | POA: Diagnosis not present

## 2015-12-15 DIAGNOSIS — I110 Hypertensive heart disease with heart failure: Secondary | ICD-10-CM | POA: Diagnosis not present

## 2015-12-15 DIAGNOSIS — J45909 Unspecified asthma, uncomplicated: Secondary | ICD-10-CM | POA: Diagnosis not present

## 2015-12-15 DIAGNOSIS — I5042 Chronic combined systolic (congestive) and diastolic (congestive) heart failure: Secondary | ICD-10-CM | POA: Diagnosis not present

## 2015-12-18 DIAGNOSIS — I5042 Chronic combined systolic (congestive) and diastolic (congestive) heart failure: Secondary | ICD-10-CM | POA: Diagnosis not present

## 2015-12-18 DIAGNOSIS — I110 Hypertensive heart disease with heart failure: Secondary | ICD-10-CM | POA: Diagnosis not present

## 2015-12-18 DIAGNOSIS — F419 Anxiety disorder, unspecified: Secondary | ICD-10-CM | POA: Diagnosis not present

## 2015-12-18 DIAGNOSIS — M6281 Muscle weakness (generalized): Secondary | ICD-10-CM | POA: Diagnosis not present

## 2015-12-18 DIAGNOSIS — J45909 Unspecified asthma, uncomplicated: Secondary | ICD-10-CM | POA: Diagnosis not present

## 2015-12-18 DIAGNOSIS — M1991 Primary osteoarthritis, unspecified site: Secondary | ICD-10-CM | POA: Diagnosis not present

## 2015-12-18 DIAGNOSIS — R296 Repeated falls: Secondary | ICD-10-CM | POA: Diagnosis not present

## 2015-12-18 DIAGNOSIS — L89222 Pressure ulcer of left hip, stage 2: Secondary | ICD-10-CM | POA: Diagnosis not present

## 2015-12-18 DIAGNOSIS — I482 Chronic atrial fibrillation: Secondary | ICD-10-CM | POA: Diagnosis not present

## 2015-12-19 DIAGNOSIS — F419 Anxiety disorder, unspecified: Secondary | ICD-10-CM | POA: Diagnosis not present

## 2015-12-19 DIAGNOSIS — I482 Chronic atrial fibrillation: Secondary | ICD-10-CM | POA: Diagnosis not present

## 2015-12-19 DIAGNOSIS — M1991 Primary osteoarthritis, unspecified site: Secondary | ICD-10-CM | POA: Diagnosis not present

## 2015-12-19 DIAGNOSIS — I5042 Chronic combined systolic (congestive) and diastolic (congestive) heart failure: Secondary | ICD-10-CM | POA: Diagnosis not present

## 2015-12-19 DIAGNOSIS — L89222 Pressure ulcer of left hip, stage 2: Secondary | ICD-10-CM | POA: Diagnosis not present

## 2015-12-19 DIAGNOSIS — I110 Hypertensive heart disease with heart failure: Secondary | ICD-10-CM | POA: Diagnosis not present

## 2015-12-19 DIAGNOSIS — R296 Repeated falls: Secondary | ICD-10-CM | POA: Diagnosis not present

## 2015-12-19 DIAGNOSIS — J45909 Unspecified asthma, uncomplicated: Secondary | ICD-10-CM | POA: Diagnosis not present

## 2015-12-19 DIAGNOSIS — M6281 Muscle weakness (generalized): Secondary | ICD-10-CM | POA: Diagnosis not present

## 2015-12-20 ENCOUNTER — Other Ambulatory Visit: Payer: Self-pay

## 2015-12-20 DIAGNOSIS — L89222 Pressure ulcer of left hip, stage 2: Secondary | ICD-10-CM | POA: Diagnosis not present

## 2015-12-20 DIAGNOSIS — J45909 Unspecified asthma, uncomplicated: Secondary | ICD-10-CM | POA: Diagnosis not present

## 2015-12-20 DIAGNOSIS — F419 Anxiety disorder, unspecified: Secondary | ICD-10-CM | POA: Diagnosis not present

## 2015-12-20 DIAGNOSIS — I5042 Chronic combined systolic (congestive) and diastolic (congestive) heart failure: Secondary | ICD-10-CM | POA: Diagnosis not present

## 2015-12-20 DIAGNOSIS — I110 Hypertensive heart disease with heart failure: Secondary | ICD-10-CM | POA: Diagnosis not present

## 2015-12-20 DIAGNOSIS — R296 Repeated falls: Secondary | ICD-10-CM | POA: Diagnosis not present

## 2015-12-20 DIAGNOSIS — I482 Chronic atrial fibrillation: Secondary | ICD-10-CM | POA: Diagnosis not present

## 2015-12-20 DIAGNOSIS — M1991 Primary osteoarthritis, unspecified site: Secondary | ICD-10-CM | POA: Diagnosis not present

## 2015-12-20 DIAGNOSIS — M6281 Muscle weakness (generalized): Secondary | ICD-10-CM | POA: Diagnosis not present

## 2015-12-20 NOTE — Patient Outreach (Signed)
[facility]) Care Management  12/20/2015  Fran LowesJoel A Moseman 10/18/1940 [id number]   Telephone Screen  Referral Date: 12/11/15 Referral Source: [facility] Referral Reason: " "dtr(Sandra) requesting urgent CM assistance for her father, states his health is deteriorating, member homebound and requires around the clock care, caregiver completley overwhelmed"   Outreach call to dtr. Advised dtr that RN CM had verified with [facility] Practice relations personel regarding status of Dr. Darleene CleaverAsenso as being [facility]. MD is no longer a [facility] and therefore we are unable to provide services to patient at this time. Dtr voiced understanding. She reported that she has [facility] coming out to see patient now and providing PT,OT and RN services which is helping.     Plan: RN CM will notify [facility] administrative assistant of case closure.   Antionette Fairyoshanda Janiene Aarons, RN,BSN,CCM [facility] Care Management Telephonic Care Management Coordinator Direct Phone: [phone] Toll Free: [phone] Fax: [phone]

## 2015-12-21 DIAGNOSIS — M1991 Primary osteoarthritis, unspecified site: Secondary | ICD-10-CM | POA: Diagnosis not present

## 2015-12-21 DIAGNOSIS — I110 Hypertensive heart disease with heart failure: Secondary | ICD-10-CM | POA: Diagnosis not present

## 2015-12-21 DIAGNOSIS — I482 Chronic atrial fibrillation: Secondary | ICD-10-CM | POA: Diagnosis not present

## 2015-12-21 DIAGNOSIS — M6281 Muscle weakness (generalized): Secondary | ICD-10-CM | POA: Diagnosis not present

## 2015-12-21 DIAGNOSIS — R296 Repeated falls: Secondary | ICD-10-CM | POA: Diagnosis not present

## 2015-12-21 DIAGNOSIS — I5042 Chronic combined systolic (congestive) and diastolic (congestive) heart failure: Secondary | ICD-10-CM | POA: Diagnosis not present

## 2015-12-21 DIAGNOSIS — F419 Anxiety disorder, unspecified: Secondary | ICD-10-CM | POA: Diagnosis not present

## 2015-12-21 DIAGNOSIS — L89222 Pressure ulcer of left hip, stage 2: Secondary | ICD-10-CM | POA: Diagnosis not present

## 2015-12-21 DIAGNOSIS — J45909 Unspecified asthma, uncomplicated: Secondary | ICD-10-CM | POA: Diagnosis not present

## 2015-12-22 DIAGNOSIS — I5040 Unspecified combined systolic (congestive) and diastolic (congestive) heart failure: Secondary | ICD-10-CM | POA: Diagnosis not present

## 2015-12-22 DIAGNOSIS — I110 Hypertensive heart disease with heart failure: Secondary | ICD-10-CM | POA: Diagnosis not present

## 2015-12-22 DIAGNOSIS — M6281 Muscle weakness (generalized): Secondary | ICD-10-CM | POA: Diagnosis not present

## 2015-12-22 DIAGNOSIS — F419 Anxiety disorder, unspecified: Secondary | ICD-10-CM | POA: Diagnosis not present

## 2015-12-22 DIAGNOSIS — R296 Repeated falls: Secondary | ICD-10-CM | POA: Diagnosis not present

## 2015-12-22 DIAGNOSIS — M1991 Primary osteoarthritis, unspecified site: Secondary | ICD-10-CM | POA: Diagnosis not present

## 2015-12-22 DIAGNOSIS — J45909 Unspecified asthma, uncomplicated: Secondary | ICD-10-CM | POA: Diagnosis not present

## 2015-12-22 DIAGNOSIS — N400 Enlarged prostate without lower urinary tract symptoms: Secondary | ICD-10-CM | POA: Diagnosis not present

## 2015-12-22 DIAGNOSIS — L89222 Pressure ulcer of left hip, stage 2: Secondary | ICD-10-CM | POA: Diagnosis not present

## 2015-12-22 DIAGNOSIS — I5042 Chronic combined systolic (congestive) and diastolic (congestive) heart failure: Secondary | ICD-10-CM | POA: Diagnosis not present

## 2015-12-22 DIAGNOSIS — I482 Chronic atrial fibrillation: Secondary | ICD-10-CM | POA: Diagnosis not present

## 2015-12-25 DIAGNOSIS — L89222 Pressure ulcer of left hip, stage 2: Secondary | ICD-10-CM | POA: Diagnosis not present

## 2015-12-25 DIAGNOSIS — M6281 Muscle weakness (generalized): Secondary | ICD-10-CM | POA: Diagnosis not present

## 2015-12-25 DIAGNOSIS — J45909 Unspecified asthma, uncomplicated: Secondary | ICD-10-CM | POA: Diagnosis not present

## 2015-12-25 DIAGNOSIS — F419 Anxiety disorder, unspecified: Secondary | ICD-10-CM | POA: Diagnosis not present

## 2015-12-25 DIAGNOSIS — I482 Chronic atrial fibrillation: Secondary | ICD-10-CM | POA: Diagnosis not present

## 2015-12-25 DIAGNOSIS — R296 Repeated falls: Secondary | ICD-10-CM | POA: Diagnosis not present

## 2015-12-25 DIAGNOSIS — I110 Hypertensive heart disease with heart failure: Secondary | ICD-10-CM | POA: Diagnosis not present

## 2015-12-25 DIAGNOSIS — M1991 Primary osteoarthritis, unspecified site: Secondary | ICD-10-CM | POA: Diagnosis not present

## 2015-12-25 DIAGNOSIS — I5042 Chronic combined systolic (congestive) and diastolic (congestive) heart failure: Secondary | ICD-10-CM | POA: Diagnosis not present

## 2015-12-26 DIAGNOSIS — M1991 Primary osteoarthritis, unspecified site: Secondary | ICD-10-CM | POA: Diagnosis not present

## 2015-12-26 DIAGNOSIS — F419 Anxiety disorder, unspecified: Secondary | ICD-10-CM | POA: Diagnosis not present

## 2015-12-26 DIAGNOSIS — M6281 Muscle weakness (generalized): Secondary | ICD-10-CM | POA: Diagnosis not present

## 2015-12-26 DIAGNOSIS — I5042 Chronic combined systolic (congestive) and diastolic (congestive) heart failure: Secondary | ICD-10-CM | POA: Diagnosis not present

## 2015-12-26 DIAGNOSIS — I482 Chronic atrial fibrillation: Secondary | ICD-10-CM | POA: Diagnosis not present

## 2015-12-26 DIAGNOSIS — I110 Hypertensive heart disease with heart failure: Secondary | ICD-10-CM | POA: Diagnosis not present

## 2015-12-26 DIAGNOSIS — R296 Repeated falls: Secondary | ICD-10-CM | POA: Diagnosis not present

## 2015-12-26 DIAGNOSIS — L89222 Pressure ulcer of left hip, stage 2: Secondary | ICD-10-CM | POA: Diagnosis not present

## 2015-12-26 DIAGNOSIS — J45909 Unspecified asthma, uncomplicated: Secondary | ICD-10-CM | POA: Diagnosis not present

## 2015-12-27 DIAGNOSIS — M6281 Muscle weakness (generalized): Secondary | ICD-10-CM | POA: Diagnosis not present

## 2015-12-27 DIAGNOSIS — I482 Chronic atrial fibrillation: Secondary | ICD-10-CM | POA: Diagnosis not present

## 2015-12-27 DIAGNOSIS — R296 Repeated falls: Secondary | ICD-10-CM | POA: Diagnosis not present

## 2015-12-27 DIAGNOSIS — M1991 Primary osteoarthritis, unspecified site: Secondary | ICD-10-CM | POA: Diagnosis not present

## 2015-12-27 DIAGNOSIS — I110 Hypertensive heart disease with heart failure: Secondary | ICD-10-CM | POA: Diagnosis not present

## 2015-12-27 DIAGNOSIS — L89222 Pressure ulcer of left hip, stage 2: Secondary | ICD-10-CM | POA: Diagnosis not present

## 2015-12-27 DIAGNOSIS — I5042 Chronic combined systolic (congestive) and diastolic (congestive) heart failure: Secondary | ICD-10-CM | POA: Diagnosis not present

## 2015-12-27 DIAGNOSIS — J45909 Unspecified asthma, uncomplicated: Secondary | ICD-10-CM | POA: Diagnosis not present

## 2015-12-27 DIAGNOSIS — F419 Anxiety disorder, unspecified: Secondary | ICD-10-CM | POA: Diagnosis not present

## 2015-12-28 DIAGNOSIS — M6281 Muscle weakness (generalized): Secondary | ICD-10-CM | POA: Diagnosis not present

## 2015-12-28 DIAGNOSIS — M1991 Primary osteoarthritis, unspecified site: Secondary | ICD-10-CM | POA: Diagnosis not present

## 2015-12-28 DIAGNOSIS — I5042 Chronic combined systolic (congestive) and diastolic (congestive) heart failure: Secondary | ICD-10-CM | POA: Diagnosis not present

## 2015-12-28 DIAGNOSIS — J45909 Unspecified asthma, uncomplicated: Secondary | ICD-10-CM | POA: Diagnosis not present

## 2015-12-28 DIAGNOSIS — L89222 Pressure ulcer of left hip, stage 2: Secondary | ICD-10-CM | POA: Diagnosis not present

## 2015-12-28 DIAGNOSIS — I110 Hypertensive heart disease with heart failure: Secondary | ICD-10-CM | POA: Diagnosis not present

## 2015-12-28 DIAGNOSIS — I482 Chronic atrial fibrillation: Secondary | ICD-10-CM | POA: Diagnosis not present

## 2015-12-28 DIAGNOSIS — F419 Anxiety disorder, unspecified: Secondary | ICD-10-CM | POA: Diagnosis not present

## 2015-12-28 DIAGNOSIS — R296 Repeated falls: Secondary | ICD-10-CM | POA: Diagnosis not present

## 2015-12-29 DIAGNOSIS — M6281 Muscle weakness (generalized): Secondary | ICD-10-CM | POA: Diagnosis not present

## 2015-12-29 DIAGNOSIS — J45909 Unspecified asthma, uncomplicated: Secondary | ICD-10-CM | POA: Diagnosis not present

## 2015-12-29 DIAGNOSIS — I110 Hypertensive heart disease with heart failure: Secondary | ICD-10-CM | POA: Diagnosis not present

## 2015-12-29 DIAGNOSIS — I482 Chronic atrial fibrillation: Secondary | ICD-10-CM | POA: Diagnosis not present

## 2015-12-29 DIAGNOSIS — F419 Anxiety disorder, unspecified: Secondary | ICD-10-CM | POA: Diagnosis not present

## 2015-12-29 DIAGNOSIS — L89222 Pressure ulcer of left hip, stage 2: Secondary | ICD-10-CM | POA: Diagnosis not present

## 2015-12-29 DIAGNOSIS — I5042 Chronic combined systolic (congestive) and diastolic (congestive) heart failure: Secondary | ICD-10-CM | POA: Diagnosis not present

## 2015-12-29 DIAGNOSIS — R296 Repeated falls: Secondary | ICD-10-CM | POA: Diagnosis not present

## 2015-12-29 DIAGNOSIS — M1991 Primary osteoarthritis, unspecified site: Secondary | ICD-10-CM | POA: Diagnosis not present

## 2016-01-02 DIAGNOSIS — R296 Repeated falls: Secondary | ICD-10-CM | POA: Diagnosis not present

## 2016-01-02 DIAGNOSIS — I110 Hypertensive heart disease with heart failure: Secondary | ICD-10-CM | POA: Diagnosis not present

## 2016-01-02 DIAGNOSIS — M1991 Primary osteoarthritis, unspecified site: Secondary | ICD-10-CM | POA: Diagnosis not present

## 2016-01-02 DIAGNOSIS — I5042 Chronic combined systolic (congestive) and diastolic (congestive) heart failure: Secondary | ICD-10-CM | POA: Diagnosis not present

## 2016-01-02 DIAGNOSIS — M6281 Muscle weakness (generalized): Secondary | ICD-10-CM | POA: Diagnosis not present

## 2016-01-02 DIAGNOSIS — F419 Anxiety disorder, unspecified: Secondary | ICD-10-CM | POA: Diagnosis not present

## 2016-01-02 DIAGNOSIS — I482 Chronic atrial fibrillation: Secondary | ICD-10-CM | POA: Diagnosis not present

## 2016-01-02 DIAGNOSIS — L89222 Pressure ulcer of left hip, stage 2: Secondary | ICD-10-CM | POA: Diagnosis not present

## 2016-01-02 DIAGNOSIS — J45909 Unspecified asthma, uncomplicated: Secondary | ICD-10-CM | POA: Diagnosis not present

## 2016-01-03 DIAGNOSIS — F419 Anxiety disorder, unspecified: Secondary | ICD-10-CM | POA: Diagnosis not present

## 2016-01-03 DIAGNOSIS — R296 Repeated falls: Secondary | ICD-10-CM | POA: Diagnosis not present

## 2016-01-03 DIAGNOSIS — N400 Enlarged prostate without lower urinary tract symptoms: Secondary | ICD-10-CM | POA: Diagnosis not present

## 2016-01-03 DIAGNOSIS — I5040 Unspecified combined systolic (congestive) and diastolic (congestive) heart failure: Secondary | ICD-10-CM | POA: Diagnosis not present

## 2016-01-03 DIAGNOSIS — M6281 Muscle weakness (generalized): Secondary | ICD-10-CM | POA: Diagnosis not present

## 2016-01-04 DIAGNOSIS — I110 Hypertensive heart disease with heart failure: Secondary | ICD-10-CM | POA: Diagnosis not present

## 2016-01-04 DIAGNOSIS — M1991 Primary osteoarthritis, unspecified site: Secondary | ICD-10-CM | POA: Diagnosis not present

## 2016-01-04 DIAGNOSIS — J45909 Unspecified asthma, uncomplicated: Secondary | ICD-10-CM | POA: Diagnosis not present

## 2016-01-04 DIAGNOSIS — L89222 Pressure ulcer of left hip, stage 2: Secondary | ICD-10-CM | POA: Diagnosis not present

## 2016-01-04 DIAGNOSIS — M6281 Muscle weakness (generalized): Secondary | ICD-10-CM | POA: Diagnosis not present

## 2016-01-04 DIAGNOSIS — I5042 Chronic combined systolic (congestive) and diastolic (congestive) heart failure: Secondary | ICD-10-CM | POA: Diagnosis not present

## 2016-01-04 DIAGNOSIS — F419 Anxiety disorder, unspecified: Secondary | ICD-10-CM | POA: Diagnosis not present

## 2016-01-04 DIAGNOSIS — I482 Chronic atrial fibrillation: Secondary | ICD-10-CM | POA: Diagnosis not present

## 2016-01-04 DIAGNOSIS — R296 Repeated falls: Secondary | ICD-10-CM | POA: Diagnosis not present

## 2016-01-05 DIAGNOSIS — I482 Chronic atrial fibrillation: Secondary | ICD-10-CM | POA: Diagnosis not present

## 2016-01-05 DIAGNOSIS — I110 Hypertensive heart disease with heart failure: Secondary | ICD-10-CM | POA: Diagnosis not present

## 2016-01-05 DIAGNOSIS — M6281 Muscle weakness (generalized): Secondary | ICD-10-CM | POA: Diagnosis not present

## 2016-01-05 DIAGNOSIS — J45909 Unspecified asthma, uncomplicated: Secondary | ICD-10-CM | POA: Diagnosis not present

## 2016-01-05 DIAGNOSIS — R296 Repeated falls: Secondary | ICD-10-CM | POA: Diagnosis not present

## 2016-01-05 DIAGNOSIS — L89222 Pressure ulcer of left hip, stage 2: Secondary | ICD-10-CM | POA: Diagnosis not present

## 2016-01-05 DIAGNOSIS — M1991 Primary osteoarthritis, unspecified site: Secondary | ICD-10-CM | POA: Diagnosis not present

## 2016-01-05 DIAGNOSIS — F419 Anxiety disorder, unspecified: Secondary | ICD-10-CM | POA: Diagnosis not present

## 2016-01-05 DIAGNOSIS — I5042 Chronic combined systolic (congestive) and diastolic (congestive) heart failure: Secondary | ICD-10-CM | POA: Diagnosis not present

## 2016-01-10 DIAGNOSIS — I482 Chronic atrial fibrillation: Secondary | ICD-10-CM | POA: Diagnosis not present

## 2016-01-10 DIAGNOSIS — I5042 Chronic combined systolic (congestive) and diastolic (congestive) heart failure: Secondary | ICD-10-CM | POA: Diagnosis not present

## 2016-01-10 DIAGNOSIS — R296 Repeated falls: Secondary | ICD-10-CM | POA: Diagnosis not present

## 2016-01-10 DIAGNOSIS — I110 Hypertensive heart disease with heart failure: Secondary | ICD-10-CM | POA: Diagnosis not present

## 2016-01-10 DIAGNOSIS — M6281 Muscle weakness (generalized): Secondary | ICD-10-CM | POA: Diagnosis not present

## 2016-01-10 DIAGNOSIS — J45909 Unspecified asthma, uncomplicated: Secondary | ICD-10-CM | POA: Diagnosis not present

## 2016-01-10 DIAGNOSIS — M1991 Primary osteoarthritis, unspecified site: Secondary | ICD-10-CM | POA: Diagnosis not present

## 2016-01-10 DIAGNOSIS — F419 Anxiety disorder, unspecified: Secondary | ICD-10-CM | POA: Diagnosis not present

## 2016-01-10 DIAGNOSIS — L89222 Pressure ulcer of left hip, stage 2: Secondary | ICD-10-CM | POA: Diagnosis not present

## 2016-01-11 DIAGNOSIS — I110 Hypertensive heart disease with heart failure: Secondary | ICD-10-CM | POA: Diagnosis not present

## 2016-01-11 DIAGNOSIS — L89222 Pressure ulcer of left hip, stage 2: Secondary | ICD-10-CM | POA: Diagnosis not present

## 2016-01-11 DIAGNOSIS — I482 Chronic atrial fibrillation: Secondary | ICD-10-CM | POA: Diagnosis not present

## 2016-01-11 DIAGNOSIS — R296 Repeated falls: Secondary | ICD-10-CM | POA: Diagnosis not present

## 2016-01-11 DIAGNOSIS — I5042 Chronic combined systolic (congestive) and diastolic (congestive) heart failure: Secondary | ICD-10-CM | POA: Diagnosis not present

## 2016-01-11 DIAGNOSIS — M6281 Muscle weakness (generalized): Secondary | ICD-10-CM | POA: Diagnosis not present

## 2016-01-11 DIAGNOSIS — M1991 Primary osteoarthritis, unspecified site: Secondary | ICD-10-CM | POA: Diagnosis not present

## 2016-01-11 DIAGNOSIS — F419 Anxiety disorder, unspecified: Secondary | ICD-10-CM | POA: Diagnosis not present

## 2016-01-11 DIAGNOSIS — J45909 Unspecified asthma, uncomplicated: Secondary | ICD-10-CM | POA: Diagnosis not present

## 2016-01-12 DIAGNOSIS — I5042 Chronic combined systolic (congestive) and diastolic (congestive) heart failure: Secondary | ICD-10-CM | POA: Diagnosis not present

## 2016-01-12 DIAGNOSIS — I482 Chronic atrial fibrillation: Secondary | ICD-10-CM | POA: Diagnosis not present

## 2016-01-12 DIAGNOSIS — R296 Repeated falls: Secondary | ICD-10-CM | POA: Diagnosis not present

## 2016-01-12 DIAGNOSIS — J45909 Unspecified asthma, uncomplicated: Secondary | ICD-10-CM | POA: Diagnosis not present

## 2016-01-12 DIAGNOSIS — M1991 Primary osteoarthritis, unspecified site: Secondary | ICD-10-CM | POA: Diagnosis not present

## 2016-01-12 DIAGNOSIS — M6281 Muscle weakness (generalized): Secondary | ICD-10-CM | POA: Diagnosis not present

## 2016-01-12 DIAGNOSIS — F419 Anxiety disorder, unspecified: Secondary | ICD-10-CM | POA: Diagnosis not present

## 2016-01-12 DIAGNOSIS — I110 Hypertensive heart disease with heart failure: Secondary | ICD-10-CM | POA: Diagnosis not present

## 2016-01-12 DIAGNOSIS — L89222 Pressure ulcer of left hip, stage 2: Secondary | ICD-10-CM | POA: Diagnosis not present

## 2016-01-15 DIAGNOSIS — L89222 Pressure ulcer of left hip, stage 2: Secondary | ICD-10-CM | POA: Diagnosis not present

## 2016-01-15 DIAGNOSIS — J45909 Unspecified asthma, uncomplicated: Secondary | ICD-10-CM | POA: Diagnosis not present

## 2016-01-15 DIAGNOSIS — M6281 Muscle weakness (generalized): Secondary | ICD-10-CM | POA: Diagnosis not present

## 2016-01-15 DIAGNOSIS — R296 Repeated falls: Secondary | ICD-10-CM | POA: Diagnosis not present

## 2016-01-15 DIAGNOSIS — F419 Anxiety disorder, unspecified: Secondary | ICD-10-CM | POA: Diagnosis not present

## 2016-01-15 DIAGNOSIS — I5042 Chronic combined systolic (congestive) and diastolic (congestive) heart failure: Secondary | ICD-10-CM | POA: Diagnosis not present

## 2016-01-15 DIAGNOSIS — M1991 Primary osteoarthritis, unspecified site: Secondary | ICD-10-CM | POA: Diagnosis not present

## 2016-01-15 DIAGNOSIS — I482 Chronic atrial fibrillation: Secondary | ICD-10-CM | POA: Diagnosis not present

## 2016-01-15 DIAGNOSIS — I110 Hypertensive heart disease with heart failure: Secondary | ICD-10-CM | POA: Diagnosis not present

## 2016-01-16 DIAGNOSIS — L89222 Pressure ulcer of left hip, stage 2: Secondary | ICD-10-CM | POA: Diagnosis not present

## 2016-01-16 DIAGNOSIS — R296 Repeated falls: Secondary | ICD-10-CM | POA: Diagnosis not present

## 2016-01-16 DIAGNOSIS — I482 Chronic atrial fibrillation: Secondary | ICD-10-CM | POA: Diagnosis not present

## 2016-01-16 DIAGNOSIS — I5042 Chronic combined systolic (congestive) and diastolic (congestive) heart failure: Secondary | ICD-10-CM | POA: Diagnosis not present

## 2016-01-16 DIAGNOSIS — M6281 Muscle weakness (generalized): Secondary | ICD-10-CM | POA: Diagnosis not present

## 2016-01-16 DIAGNOSIS — I110 Hypertensive heart disease with heart failure: Secondary | ICD-10-CM | POA: Diagnosis not present

## 2016-01-16 DIAGNOSIS — F419 Anxiety disorder, unspecified: Secondary | ICD-10-CM | POA: Diagnosis not present

## 2016-01-16 DIAGNOSIS — M1991 Primary osteoarthritis, unspecified site: Secondary | ICD-10-CM | POA: Diagnosis not present

## 2016-01-16 DIAGNOSIS — J45909 Unspecified asthma, uncomplicated: Secondary | ICD-10-CM | POA: Diagnosis not present

## 2016-01-19 DIAGNOSIS — I110 Hypertensive heart disease with heart failure: Secondary | ICD-10-CM | POA: Diagnosis not present

## 2016-01-19 DIAGNOSIS — F419 Anxiety disorder, unspecified: Secondary | ICD-10-CM | POA: Diagnosis not present

## 2016-01-19 DIAGNOSIS — M1991 Primary osteoarthritis, unspecified site: Secondary | ICD-10-CM | POA: Diagnosis not present

## 2016-01-19 DIAGNOSIS — I482 Chronic atrial fibrillation: Secondary | ICD-10-CM | POA: Diagnosis not present

## 2016-01-19 DIAGNOSIS — R296 Repeated falls: Secondary | ICD-10-CM | POA: Diagnosis not present

## 2016-01-19 DIAGNOSIS — M6281 Muscle weakness (generalized): Secondary | ICD-10-CM | POA: Diagnosis not present

## 2016-01-19 DIAGNOSIS — L89222 Pressure ulcer of left hip, stage 2: Secondary | ICD-10-CM | POA: Diagnosis not present

## 2016-01-19 DIAGNOSIS — I5042 Chronic combined systolic (congestive) and diastolic (congestive) heart failure: Secondary | ICD-10-CM | POA: Diagnosis not present

## 2016-01-19 DIAGNOSIS — J45909 Unspecified asthma, uncomplicated: Secondary | ICD-10-CM | POA: Diagnosis not present

## 2016-01-21 DIAGNOSIS — R296 Repeated falls: Secondary | ICD-10-CM | POA: Diagnosis not present

## 2016-01-21 DIAGNOSIS — I5040 Unspecified combined systolic (congestive) and diastolic (congestive) heart failure: Secondary | ICD-10-CM | POA: Diagnosis not present

## 2016-01-21 DIAGNOSIS — N400 Enlarged prostate without lower urinary tract symptoms: Secondary | ICD-10-CM | POA: Diagnosis not present

## 2016-01-21 DIAGNOSIS — M6281 Muscle weakness (generalized): Secondary | ICD-10-CM | POA: Diagnosis not present

## 2016-01-21 DIAGNOSIS — F419 Anxiety disorder, unspecified: Secondary | ICD-10-CM | POA: Diagnosis not present

## 2016-01-23 DIAGNOSIS — I110 Hypertensive heart disease with heart failure: Secondary | ICD-10-CM | POA: Diagnosis not present

## 2016-01-23 DIAGNOSIS — F419 Anxiety disorder, unspecified: Secondary | ICD-10-CM | POA: Diagnosis not present

## 2016-01-23 DIAGNOSIS — J45909 Unspecified asthma, uncomplicated: Secondary | ICD-10-CM | POA: Diagnosis not present

## 2016-01-23 DIAGNOSIS — I482 Chronic atrial fibrillation: Secondary | ICD-10-CM | POA: Diagnosis not present

## 2016-01-23 DIAGNOSIS — M6281 Muscle weakness (generalized): Secondary | ICD-10-CM | POA: Diagnosis not present

## 2016-01-23 DIAGNOSIS — I5042 Chronic combined systolic (congestive) and diastolic (congestive) heart failure: Secondary | ICD-10-CM | POA: Diagnosis not present

## 2016-01-23 DIAGNOSIS — L89222 Pressure ulcer of left hip, stage 2: Secondary | ICD-10-CM | POA: Diagnosis not present

## 2016-01-23 DIAGNOSIS — M1991 Primary osteoarthritis, unspecified site: Secondary | ICD-10-CM | POA: Diagnosis not present

## 2016-01-23 DIAGNOSIS — R296 Repeated falls: Secondary | ICD-10-CM | POA: Diagnosis not present

## 2016-01-24 DIAGNOSIS — N400 Enlarged prostate without lower urinary tract symptoms: Secondary | ICD-10-CM | POA: Diagnosis not present

## 2016-01-24 DIAGNOSIS — I482 Chronic atrial fibrillation: Secondary | ICD-10-CM | POA: Diagnosis not present

## 2016-01-24 DIAGNOSIS — R296 Repeated falls: Secondary | ICD-10-CM | POA: Diagnosis not present

## 2016-01-24 DIAGNOSIS — I5022 Chronic systolic (congestive) heart failure: Secondary | ICD-10-CM | POA: Diagnosis not present

## 2016-01-24 DIAGNOSIS — F0150 Vascular dementia without behavioral disturbance: Secondary | ICD-10-CM | POA: Diagnosis not present

## 2016-01-24 DIAGNOSIS — F419 Anxiety disorder, unspecified: Secondary | ICD-10-CM | POA: Diagnosis not present

## 2016-01-24 DIAGNOSIS — M6281 Muscle weakness (generalized): Secondary | ICD-10-CM | POA: Diagnosis not present

## 2016-01-24 DIAGNOSIS — J45909 Unspecified asthma, uncomplicated: Secondary | ICD-10-CM | POA: Diagnosis not present

## 2016-01-24 DIAGNOSIS — I110 Hypertensive heart disease with heart failure: Secondary | ICD-10-CM | POA: Diagnosis not present

## 2016-01-24 DIAGNOSIS — M1991 Primary osteoarthritis, unspecified site: Secondary | ICD-10-CM | POA: Diagnosis not present

## 2016-01-24 DIAGNOSIS — I639 Cerebral infarction, unspecified: Secondary | ICD-10-CM | POA: Diagnosis not present

## 2016-01-24 DIAGNOSIS — E662 Morbid (severe) obesity with alveolar hypoventilation: Secondary | ICD-10-CM | POA: Diagnosis not present

## 2016-01-24 DIAGNOSIS — I10 Essential (primary) hypertension: Secondary | ICD-10-CM | POA: Diagnosis not present

## 2016-01-24 DIAGNOSIS — I5042 Chronic combined systolic (congestive) and diastolic (congestive) heart failure: Secondary | ICD-10-CM | POA: Diagnosis not present

## 2016-01-24 DIAGNOSIS — L89222 Pressure ulcer of left hip, stage 2: Secondary | ICD-10-CM | POA: Diagnosis not present

## 2016-02-02 DIAGNOSIS — R296 Repeated falls: Secondary | ICD-10-CM | POA: Diagnosis not present

## 2016-02-02 DIAGNOSIS — N400 Enlarged prostate without lower urinary tract symptoms: Secondary | ICD-10-CM | POA: Diagnosis not present

## 2016-02-02 DIAGNOSIS — I4891 Unspecified atrial fibrillation: Secondary | ICD-10-CM | POA: Diagnosis not present

## 2016-02-02 DIAGNOSIS — I739 Peripheral vascular disease, unspecified: Secondary | ICD-10-CM | POA: Diagnosis not present

## 2016-02-02 DIAGNOSIS — M6281 Muscle weakness (generalized): Secondary | ICD-10-CM | POA: Diagnosis not present

## 2016-02-02 DIAGNOSIS — I5040 Unspecified combined systolic (congestive) and diastolic (congestive) heart failure: Secondary | ICD-10-CM | POA: Diagnosis not present

## 2016-02-02 DIAGNOSIS — F419 Anxiety disorder, unspecified: Secondary | ICD-10-CM | POA: Diagnosis not present

## 2016-02-02 DIAGNOSIS — E785 Hyperlipidemia, unspecified: Secondary | ICD-10-CM | POA: Diagnosis not present

## 2016-02-02 DIAGNOSIS — J45909 Unspecified asthma, uncomplicated: Secondary | ICD-10-CM | POA: Diagnosis not present

## 2016-02-02 DIAGNOSIS — G8929 Other chronic pain: Secondary | ICD-10-CM | POA: Diagnosis not present

## 2016-02-02 DIAGNOSIS — I5020 Unspecified systolic (congestive) heart failure: Secondary | ICD-10-CM | POA: Diagnosis not present

## 2016-02-02 DIAGNOSIS — I110 Hypertensive heart disease with heart failure: Secondary | ICD-10-CM | POA: Diagnosis not present

## 2016-02-02 DIAGNOSIS — R2689 Other abnormalities of gait and mobility: Secondary | ICD-10-CM | POA: Diagnosis not present

## 2016-02-02 DIAGNOSIS — F0150 Vascular dementia without behavioral disturbance: Secondary | ICD-10-CM | POA: Diagnosis not present

## 2016-02-03 DIAGNOSIS — I5042 Chronic combined systolic (congestive) and diastolic (congestive) heart failure: Secondary | ICD-10-CM | POA: Diagnosis not present

## 2016-02-03 DIAGNOSIS — I10 Essential (primary) hypertension: Secondary | ICD-10-CM | POA: Diagnosis not present

## 2016-02-05 DIAGNOSIS — I5042 Chronic combined systolic (congestive) and diastolic (congestive) heart failure: Secondary | ICD-10-CM | POA: Diagnosis not present

## 2016-02-05 DIAGNOSIS — M1991 Primary osteoarthritis, unspecified site: Secondary | ICD-10-CM | POA: Diagnosis not present

## 2016-02-05 DIAGNOSIS — R296 Repeated falls: Secondary | ICD-10-CM | POA: Diagnosis not present

## 2016-02-05 DIAGNOSIS — M6281 Muscle weakness (generalized): Secondary | ICD-10-CM | POA: Diagnosis not present

## 2016-02-05 DIAGNOSIS — J45909 Unspecified asthma, uncomplicated: Secondary | ICD-10-CM | POA: Diagnosis not present

## 2016-02-05 DIAGNOSIS — F419 Anxiety disorder, unspecified: Secondary | ICD-10-CM | POA: Diagnosis not present

## 2016-02-05 DIAGNOSIS — I110 Hypertensive heart disease with heart failure: Secondary | ICD-10-CM | POA: Diagnosis not present

## 2016-02-05 DIAGNOSIS — I482 Chronic atrial fibrillation: Secondary | ICD-10-CM | POA: Diagnosis not present

## 2016-02-05 DIAGNOSIS — L89222 Pressure ulcer of left hip, stage 2: Secondary | ICD-10-CM | POA: Diagnosis not present

## 2016-02-06 DIAGNOSIS — M1991 Primary osteoarthritis, unspecified site: Secondary | ICD-10-CM | POA: Diagnosis not present

## 2016-02-06 DIAGNOSIS — J45909 Unspecified asthma, uncomplicated: Secondary | ICD-10-CM | POA: Diagnosis not present

## 2016-02-06 DIAGNOSIS — I482 Chronic atrial fibrillation: Secondary | ICD-10-CM | POA: Diagnosis not present

## 2016-02-06 DIAGNOSIS — L89222 Pressure ulcer of left hip, stage 2: Secondary | ICD-10-CM | POA: Diagnosis not present

## 2016-02-06 DIAGNOSIS — I5042 Chronic combined systolic (congestive) and diastolic (congestive) heart failure: Secondary | ICD-10-CM | POA: Diagnosis not present

## 2016-02-06 DIAGNOSIS — R296 Repeated falls: Secondary | ICD-10-CM | POA: Diagnosis not present

## 2016-02-06 DIAGNOSIS — F419 Anxiety disorder, unspecified: Secondary | ICD-10-CM | POA: Diagnosis not present

## 2016-02-06 DIAGNOSIS — I110 Hypertensive heart disease with heart failure: Secondary | ICD-10-CM | POA: Diagnosis not present

## 2016-02-06 DIAGNOSIS — M6281 Muscle weakness (generalized): Secondary | ICD-10-CM | POA: Diagnosis not present

## 2016-02-08 DIAGNOSIS — L89222 Pressure ulcer of left hip, stage 2: Secondary | ICD-10-CM | POA: Diagnosis not present

## 2016-02-08 DIAGNOSIS — I482 Chronic atrial fibrillation: Secondary | ICD-10-CM | POA: Diagnosis not present

## 2016-02-08 DIAGNOSIS — F419 Anxiety disorder, unspecified: Secondary | ICD-10-CM | POA: Diagnosis not present

## 2016-02-08 DIAGNOSIS — I110 Hypertensive heart disease with heart failure: Secondary | ICD-10-CM | POA: Diagnosis not present

## 2016-02-08 DIAGNOSIS — M1991 Primary osteoarthritis, unspecified site: Secondary | ICD-10-CM | POA: Diagnosis not present

## 2016-02-08 DIAGNOSIS — R296 Repeated falls: Secondary | ICD-10-CM | POA: Diagnosis not present

## 2016-02-08 DIAGNOSIS — M6281 Muscle weakness (generalized): Secondary | ICD-10-CM | POA: Diagnosis not present

## 2016-02-08 DIAGNOSIS — J45909 Unspecified asthma, uncomplicated: Secondary | ICD-10-CM | POA: Diagnosis not present

## 2016-02-08 DIAGNOSIS — I5042 Chronic combined systolic (congestive) and diastolic (congestive) heart failure: Secondary | ICD-10-CM | POA: Diagnosis not present

## 2016-02-21 DIAGNOSIS — N400 Enlarged prostate without lower urinary tract symptoms: Secondary | ICD-10-CM | POA: Diagnosis not present

## 2016-02-21 DIAGNOSIS — I5040 Unspecified combined systolic (congestive) and diastolic (congestive) heart failure: Secondary | ICD-10-CM | POA: Diagnosis not present

## 2016-02-21 DIAGNOSIS — L89222 Pressure ulcer of left hip, stage 2: Secondary | ICD-10-CM | POA: Diagnosis not present

## 2016-02-21 DIAGNOSIS — M6281 Muscle weakness (generalized): Secondary | ICD-10-CM | POA: Diagnosis not present

## 2016-02-21 DIAGNOSIS — F419 Anxiety disorder, unspecified: Secondary | ICD-10-CM | POA: Diagnosis not present

## 2016-02-21 DIAGNOSIS — M623 Immobility syndrome (paraplegic): Secondary | ICD-10-CM | POA: Diagnosis not present

## 2016-02-21 DIAGNOSIS — R3981 Functional urinary incontinence: Secondary | ICD-10-CM | POA: Diagnosis not present

## 2016-02-21 DIAGNOSIS — R296 Repeated falls: Secondary | ICD-10-CM | POA: Diagnosis not present

## 2016-02-21 DIAGNOSIS — J449 Chronic obstructive pulmonary disease, unspecified: Secondary | ICD-10-CM | POA: Diagnosis not present

## 2016-02-27 DIAGNOSIS — E662 Morbid (severe) obesity with alveolar hypoventilation: Secondary | ICD-10-CM | POA: Diagnosis not present

## 2016-02-27 DIAGNOSIS — N400 Enlarged prostate without lower urinary tract symptoms: Secondary | ICD-10-CM | POA: Diagnosis not present

## 2016-02-27 DIAGNOSIS — F0150 Vascular dementia without behavioral disturbance: Secondary | ICD-10-CM | POA: Diagnosis not present

## 2016-02-27 DIAGNOSIS — I482 Chronic atrial fibrillation: Secondary | ICD-10-CM | POA: Diagnosis not present

## 2016-02-27 DIAGNOSIS — I10 Essential (primary) hypertension: Secondary | ICD-10-CM | POA: Diagnosis not present

## 2016-02-27 DIAGNOSIS — I5022 Chronic systolic (congestive) heart failure: Secondary | ICD-10-CM | POA: Diagnosis not present

## 2016-03-04 DIAGNOSIS — M6281 Muscle weakness (generalized): Secondary | ICD-10-CM | POA: Diagnosis not present

## 2016-03-04 DIAGNOSIS — I5040 Unspecified combined systolic (congestive) and diastolic (congestive) heart failure: Secondary | ICD-10-CM | POA: Diagnosis not present

## 2016-03-04 DIAGNOSIS — N400 Enlarged prostate without lower urinary tract symptoms: Secondary | ICD-10-CM | POA: Diagnosis not present

## 2016-03-04 DIAGNOSIS — R296 Repeated falls: Secondary | ICD-10-CM | POA: Diagnosis not present

## 2016-03-04 DIAGNOSIS — F419 Anxiety disorder, unspecified: Secondary | ICD-10-CM | POA: Diagnosis not present

## 2016-03-23 DIAGNOSIS — M6281 Muscle weakness (generalized): Secondary | ICD-10-CM | POA: Diagnosis not present

## 2016-03-23 DIAGNOSIS — R296 Repeated falls: Secondary | ICD-10-CM | POA: Diagnosis not present

## 2016-03-23 DIAGNOSIS — M623 Immobility syndrome (paraplegic): Secondary | ICD-10-CM | POA: Diagnosis not present

## 2016-03-23 DIAGNOSIS — L89222 Pressure ulcer of left hip, stage 2: Secondary | ICD-10-CM | POA: Diagnosis not present

## 2016-03-23 DIAGNOSIS — F419 Anxiety disorder, unspecified: Secondary | ICD-10-CM | POA: Diagnosis not present

## 2016-03-23 DIAGNOSIS — R3981 Functional urinary incontinence: Secondary | ICD-10-CM | POA: Diagnosis not present

## 2016-03-23 DIAGNOSIS — I5040 Unspecified combined systolic (congestive) and diastolic (congestive) heart failure: Secondary | ICD-10-CM | POA: Diagnosis not present

## 2016-03-23 DIAGNOSIS — N400 Enlarged prostate without lower urinary tract symptoms: Secondary | ICD-10-CM | POA: Diagnosis not present

## 2016-03-23 DIAGNOSIS — J449 Chronic obstructive pulmonary disease, unspecified: Secondary | ICD-10-CM | POA: Diagnosis not present

## 2016-04-04 DIAGNOSIS — M6281 Muscle weakness (generalized): Secondary | ICD-10-CM | POA: Diagnosis not present

## 2016-04-04 DIAGNOSIS — N400 Enlarged prostate without lower urinary tract symptoms: Secondary | ICD-10-CM | POA: Diagnosis not present

## 2016-04-04 DIAGNOSIS — I5040 Unspecified combined systolic (congestive) and diastolic (congestive) heart failure: Secondary | ICD-10-CM | POA: Diagnosis not present

## 2016-04-04 DIAGNOSIS — F419 Anxiety disorder, unspecified: Secondary | ICD-10-CM | POA: Diagnosis not present

## 2016-04-04 DIAGNOSIS — R296 Repeated falls: Secondary | ICD-10-CM | POA: Diagnosis not present

## 2016-04-22 DIAGNOSIS — L89222 Pressure ulcer of left hip, stage 2: Secondary | ICD-10-CM | POA: Diagnosis not present

## 2016-04-22 DIAGNOSIS — R3981 Functional urinary incontinence: Secondary | ICD-10-CM | POA: Diagnosis not present

## 2016-04-22 DIAGNOSIS — R296 Repeated falls: Secondary | ICD-10-CM | POA: Diagnosis not present

## 2016-04-22 DIAGNOSIS — J449 Chronic obstructive pulmonary disease, unspecified: Secondary | ICD-10-CM | POA: Diagnosis not present

## 2016-04-22 DIAGNOSIS — I5040 Unspecified combined systolic (congestive) and diastolic (congestive) heart failure: Secondary | ICD-10-CM | POA: Diagnosis not present

## 2016-04-22 DIAGNOSIS — M623 Immobility syndrome (paraplegic): Secondary | ICD-10-CM | POA: Diagnosis not present

## 2016-04-22 DIAGNOSIS — F419 Anxiety disorder, unspecified: Secondary | ICD-10-CM | POA: Diagnosis not present

## 2016-04-22 DIAGNOSIS — N400 Enlarged prostate without lower urinary tract symptoms: Secondary | ICD-10-CM | POA: Diagnosis not present

## 2016-04-22 DIAGNOSIS — M6281 Muscle weakness (generalized): Secondary | ICD-10-CM | POA: Diagnosis not present

## 2016-05-04 DIAGNOSIS — R296 Repeated falls: Secondary | ICD-10-CM | POA: Diagnosis not present

## 2016-05-04 DIAGNOSIS — M6281 Muscle weakness (generalized): Secondary | ICD-10-CM | POA: Diagnosis not present

## 2016-05-04 DIAGNOSIS — I5040 Unspecified combined systolic (congestive) and diastolic (congestive) heart failure: Secondary | ICD-10-CM | POA: Diagnosis not present

## 2016-05-04 DIAGNOSIS — N400 Enlarged prostate without lower urinary tract symptoms: Secondary | ICD-10-CM | POA: Diagnosis not present

## 2016-05-04 DIAGNOSIS — F419 Anxiety disorder, unspecified: Secondary | ICD-10-CM | POA: Diagnosis not present

## 2016-05-23 DIAGNOSIS — M6281 Muscle weakness (generalized): Secondary | ICD-10-CM | POA: Diagnosis not present

## 2016-05-23 DIAGNOSIS — R296 Repeated falls: Secondary | ICD-10-CM | POA: Diagnosis not present

## 2016-05-23 DIAGNOSIS — I5040 Unspecified combined systolic (congestive) and diastolic (congestive) heart failure: Secondary | ICD-10-CM | POA: Diagnosis not present

## 2016-05-23 DIAGNOSIS — R3981 Functional urinary incontinence: Secondary | ICD-10-CM | POA: Diagnosis not present

## 2016-05-23 DIAGNOSIS — J449 Chronic obstructive pulmonary disease, unspecified: Secondary | ICD-10-CM | POA: Diagnosis not present

## 2016-05-23 DIAGNOSIS — F419 Anxiety disorder, unspecified: Secondary | ICD-10-CM | POA: Diagnosis not present

## 2016-05-23 DIAGNOSIS — L89222 Pressure ulcer of left hip, stage 2: Secondary | ICD-10-CM | POA: Diagnosis not present

## 2016-05-23 DIAGNOSIS — N400 Enlarged prostate without lower urinary tract symptoms: Secondary | ICD-10-CM | POA: Diagnosis not present

## 2016-05-23 DIAGNOSIS — M623 Immobility syndrome (paraplegic): Secondary | ICD-10-CM | POA: Diagnosis not present

## 2016-06-04 DIAGNOSIS — M6281 Muscle weakness (generalized): Secondary | ICD-10-CM | POA: Diagnosis not present

## 2016-06-04 DIAGNOSIS — I5040 Unspecified combined systolic (congestive) and diastolic (congestive) heart failure: Secondary | ICD-10-CM | POA: Diagnosis not present

## 2016-06-04 DIAGNOSIS — F419 Anxiety disorder, unspecified: Secondary | ICD-10-CM | POA: Diagnosis not present

## 2016-06-04 DIAGNOSIS — N400 Enlarged prostate without lower urinary tract symptoms: Secondary | ICD-10-CM | POA: Diagnosis not present

## 2016-06-04 DIAGNOSIS — R296 Repeated falls: Secondary | ICD-10-CM | POA: Diagnosis not present

## 2016-06-11 ENCOUNTER — Other Ambulatory Visit: Payer: Self-pay

## 2016-06-11 NOTE — Patient Outreach (Signed)
[facility](THN) Care Management  06/25/202017  Leonard LowesJoel A Montoya 05/09/1941 [id number]     Telephone Screen  Referral Date: 06/10/16 Referral Source: Self Referral Referral Reason: " HF, A-Fib, Dementia"    Outreach attempt # . Spoke with dtr-Leonard Montoya who is Dual POA. Screening completed.   Social: Patient resides in his home. Dtr states that patient is primarily confined to the home as he is bed to chair bound only. Her brother statys with patient and assists with care needs. She has also arranged fro a paid caregiver to assist 3hrs/day.  Dtr assists patient with all ADLs/IADLs. She reports that patient has had multiple falls(at least 7 within the past year). Last fall was in Feb. DME in the home include hoyer lift, wheelchair, oxygen(not currently using) and hospital bed.   Conditions: Patient has PMH of dementia, A-Fib, HTN, CHF, chronic normal pressure hydrocephalus. Dtr states that patient was receiving "Back to Basics" home visits by a PA up until October when he was discharged from program. She has since spoken with patient's previous PCP([facility] Patient is re-establishing care with PCP office. He has an appt on 07/04/16 at 10:30am. Dtr states that when she spoke with MD office she was advised by them to reach out to [facility] to offer additional services to patient.   Medications: Dtr reports that patient has been "refusing" to take all his meds except his Seroquel.   Appointments: Patient has appt with Dr. Tanya NonesPickard on 07/04/16 and dentist appt on 07/09/16. Per dtr she needs transportation assistance to these appts.  Advance Directives: Patient has designated dtr as Dual POA. Copy on file.   Consent: [facility] services reviewed and discussed. Verbal consent given for services.   Plan: RN CM will notify [facility] administrative assistant of case status. RN CM will send [facility] referral for transportation assistance and in home support/community  resources. RN CM will send [facility] community referral for further in home eval and assessment of care needs.    Antionette Fairyoshanda Veverly Larimer, RN,BSN,CCM [facility] Care Management Telephonic Care Management Coordinator Direct Phone: [phone] Toll Free: [phone] Fax: [phone]

## 2016-06-14 ENCOUNTER — Encounter: Payer: Self-pay | Admitting: *Deleted

## 2016-06-14 ENCOUNTER — Other Ambulatory Visit: Payer: Self-pay | Admitting: *Deleted

## 2016-06-14 NOTE — Patient Outreach (Signed)
[facility]) Care Management [facility] Community CM Telephone Outreach 06/14/2016  Fran LowesJoel A Gobert 05/21/1941 [id number]  Successful telephone outreach to Leonard ShortSandra Mericka, daughter/ caregiver, HCPOA of Leonard SharpsJoel Montoya, 75 y/o male self-referred to [facility] CM by patient's daughter on advice from patient's PCP for ongoing care needs.  [facility] telephonic RN CM spoke with patient's daughter earlier this week for care need/ evaluation screening, and referred to [facility] Community RN CM and CSW for further assessment of patient care needs.  Patient has history including, but not limited to, HTN, CHF, Atrial fibrillation with RVR, morbid obesity, dementia, HLD, and asthma.  HIPAA/ identity verified during phone call today.  [facility] CSW also involved in patient's care.  Today, Leonard CampanileSandy reports that patient is "not doing very well," stating that his previously established care at home through "Back to Basics" has been completed, "due to where he lives."  -- States patient is bed/ wheelchair bound and only gets out of bed with the use of a hoyer lift.  Reports multiple previous falls, "none recent," as patient is using a manual wheelchair now.  Leonard CampanileSandy reports that patient used to have an Mining engineerelectric wheelchair and would often attempt to get out of bed without assistance, which caused him to fall on multiple occasions.  Leonard CampanileSandy reports that the electric wheelchair is no longer operational, and since patient has been using manual wheelchair, he does not attempt to get out of bed without assistance.  Leonard CampanileSandy reports that patient is incontinent of stool/ urine and "goes to the bathroom in the bed."  -- [employer] resource needs:  Leonard CampanileSandy reports main area of needed assistance revolves around transportation to provider appointments.  Leonard CampanileSandy states that patient has been very non-compliant in keeping provider appointments, and does not wish to go to any doctors at all.  Leonard CampanileSandy reports that patient's son, her brother, currently lives with patient,  and that patient has a Engineer, agriculturalpersonal care assistant, which is paid for out-of pocket by family.  Personal care assistant currently visits patient 7 days a week, for 3 hours each visit.  Leonard CampanileSandy reports that patient is "very upset" with her and with his son who lives with him, as they "have control over" his health and his personal care, due to his dementia.  Leonard CampanileSandy reports that she knows she and her brother "sre doing the right thing-- it's the only thing we can do," but also confirms that she and her brother have a significant amount of caregiver stress around meeting patient's needs.  Leonard CampanileSandy reports that patient "will fire his caregivers and cancel his doctor's appointments," stating that she has since "put his phone on lock-down" so that he can no longer cancel established care that is currently in progress.  -- Medications:  Leonard CampanileSandy reports that patient refuses to take any of his medications.  --  Advanced Directive Planning:  Leonard CampanileSandy reports that patient has durable and HC POA, which is verified through review of EMR.  Leonard CampanileSandy does not believe that patient has a living will.  Leonard CampanileSandy states that patient was hospitalized earlier this year, (march 31- October 25, 2015) and "was very angry" that he was taken to the hospital and treated, stating, "he told us he wished we had just let him die."  Palliative Care consult was placed during that hospitalization, and patient was discharged home.  -- Provider appointments:  Leonard CampanileSandy has recently re-established PCP with Dr. Tanya NonesPickard since PA with "[facility]" is no longer coming to see patient.  Upcoming provider appointment scheduled for  July 04, 2016.  Leonard CampanileSandy reports that patient will need transportation to this appointment. Leonard CampanileSandy reports that patient has history of refusing to attend provider appointments, or cancelling provider appointments without her knowledge.   [facility] CM services were discussed with Leonard CampanileSandy, with difference between [facility] and [facility] CM explained; Leonard Montoya verbalized  understanding/ agreement.  [facility] Community CM initial home visit date options were provided to HaynesSandy as she verbalized that she needed to check her work schedule before scheduling this visit; Leonard CampanileSandy denies further concerns, problems, issues or questions today.  I provided Leonard CampanileSandy with my direct phone number, the main [facility] CM phone number, and the [facility] 24-hour nurse line phone number.  Plan:  Patient's caregiver will contact [facility] RN CM with dates she is available for [facility] Community CM in-home visit.  Patient's caregiver will work with [facility] CSW to obtain transportation to upcoming PCP provider appointment scheduled for July 04, 2016   Caryl PinaLaine Mckinney Elayah Klooster, RN, BSN, [employer] INCCCRN [employer] Community Care Coordinator [facility] Care Management  [phone]

## 2016-06-18 ENCOUNTER — Other Ambulatory Visit: Payer: Self-pay | Admitting: *Deleted

## 2016-06-18 ENCOUNTER — Encounter: Payer: Self-pay | Admitting: *Deleted

## 2016-06-18 NOTE — Patient Outreach (Signed)
[facility]) Care Management [facility] Community CM Telephone Outreach 06/18/2016  Fran LowesJoel A Redler 03/23/1941 [id number]  Successful incoming telephone outreach from Gretchen ShortSandra Mericka, daughter/ caregiver, HCPOA of Leonard SharpsJoel Dobias, 75 y/o male self-referred to [facility] CM by patient's daughter on advice from patient's PCP for ongoing care needs. [facility] telephonic RN CM spoke with patient's daughter initially for care need/ evaluation screening, and referred to [facility] Community RN CM and CSW for further assessment of patient care needs. Patient has history including, but not limited to, HTN, CHF, Atrial fibrillation with RVR, morbid obesity, dementia, HLD, and asthma. [facility] CSW also involved in patient's care.  HIPAA/ identity verified with caregiver Andrey CampanileSandy today, as patient has severe dementia.  Call today was returned from caregiver to follow up on previous phone call last week, to confirm date options for [facility] Community CM initial home visit.  During this phone call, Andrey CampanileSandy and I were able to schedule [facility] Community CM initialin-home visit for later this week.  Plan:  [facility] Community CM initial in-home visit scheduled today for later this week.  Patient's caregiver will work with [facility] CSW to obtain transportation to upcoming PCP provider appointment scheduled for July 04, 2016  Caryl PinaLaine Mckinney Tousey, RN, BSN, [employer] INCCCRN [employer] Community Care Coordinator [facility] Care Management  [phone]

## 2016-06-18 NOTE — Patient Outreach (Signed)
[facility]) Care Management [facility] Community CM Telephone Outreach 06/18/2016  Fran LowesJoel A Campillo 12/24/1940 [id number]    Unsuccessful telephone outreach to Gretchen ShortSandra Mericka, daughter/ caregiver, HCPOA of Phoebe SharpsJoel Gurganus, 75 y/o male self-referred to [facility] CM by patient's daughter on advice from patient's PCP for ongoing care needs.  [facility] telephonic RN CM spoke with patient's daughter initially for care need/ evaluation screening, and referred to [facility] Community RN CM and CSW for further assessment of patient care needs.  Patient has history including, but not limited to, HTN, CHF, Atrial fibrillation with RVR, morbid obesity, dementia, HLD, and asthma.  [facility] CSW also involved in patient's care.  Call today was placed to follow up on previous phone call last week, at which time Andrey CampanileSandy was provided several date options for [facility] Community CM initial home visit.  HIPAA compliant voicemail message left for MansfieldSandy, asking for return call back regarding scheduling of [facility] CM initial in-home visit.     Plan:  Will re-attempt [facility] Community CM telephone outreach later this week to schedule [facility] Community CM in-home visit if I do not hear back from patient's caregiver first.   Caryl PinaLaine Mckinney Lataria Courser, RN, BSN, CCRN [employer] Community Care Coordinator [facility] Care Management  [phone]

## 2016-06-19 NOTE — Patient Outreach (Signed)
[facility](THN) Care Management  06/19/2016  Fran LowesJoel A Gabrielson 01/08/1941 [id number]   Phone call to patient's daughter-Sandra  to discuss patient's case management needs. Per patient's daughter, the main resource they need is transportation to medical appointments. Patient has an appointment with Dr. Tanya NonesPickard on 07/04/16 at 10:30am and [facility].  Patient's transportation benefit from [facility] discussed.  Patient's transportation to medical appointments will be scheduled through [employer]'s dial a ride.  Patient's daughter also requested information for additional in home support. Patient currently has a private duty aid daily for 3 hours each visit but would like additional resources to provide respite for the aid.    Plan:  This Child psychotherapistsocial worker will arrange transportation to patient's provider appointments using [employer] Dial a ride. Home visit will be scheduled within 2 weeks to provide patient's daughter with additional resources for private duty aids, transportation and respite care

## 2016-06-21 ENCOUNTER — Other Ambulatory Visit: Payer: Self-pay | Admitting: *Deleted

## 2016-06-21 ENCOUNTER — Ambulatory Visit: Payer: Self-pay | Admitting: *Deleted

## 2016-06-21 ENCOUNTER — Encounter: Payer: Self-pay | Admitting: *Deleted

## 2016-06-21 NOTE — Patient Outreach (Addendum)
[facility]) Care Management  [facility] CM Initial Home Visit 06/21/2016  Leonard LowesJoel A Montoya 12/20/1940 [id number]  Leonard LowesJoel A Montoya is an 75 y.o. male [facility] patient's daughter on advice from patient's PCP for ongoing care needs.  [facility] CM spoke with patient's daughter initially and referred to [facility] RN CM and CSW for further assessment of patient care needs.  Patient has history including, but not limited to, HTN, CHF, Atrial fibrillation with RVR, morbid obesity, dementia, HLD, and asthma.  HIPAA/ identity verified with patient during home visit today.  [facility] CSW also involved in patient's care.  [facility] CM written consent was updated by patient's HCPOA today, to include patient's son.  Today, patient's daughter Leonard CampanileSandy Avon Montoya(HCPOA), son Leonard KotykJay, and private duty caregiver Leonard Montoya, are present with patient for visit.  Patient's daughter states that she is unable to be in patient's presence "because he will go off" if he sees her in his home.  Leonard CampanileSandy reports that patient "hates her."  Family members report that private duty caregiver Leonard Montoya "is the only person he trusts to care for him."  Leonard Montoya cares for patient 7 days a week for 3 hours every morning.  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  When I arrived at patient's home, patient is in middle of bed bath with caregiver Leonard Montoya.  Patient is obvious total care with bath, and can participate only to assist in rolling from side to side in bed by using triangle that is mounted in ceiling over bed.  Medications:  Patient states that he is not "going to take any medications," because he does not feel he "needs any medications."  Family reports that patient has refused to take all prescribed medications "for years."  Family states that they are "sometimes able to mix his Quetiapine Fumarate 25 mg BID in his food without his knowledge, which they report "helps with his terrible mood swings and anger."  Medications  were thoroughly reviewed with patient's family, and all prescribed medications  are present in patient's home.  See medications section for full list of prescribed medications.  Other medications present in patient's home but not currently on active medication list:  Klor-Con m20, po QD and Quetiapine Fumarate 25 mg BID ordered by Dr. Marletta LorJulie Montoya with [facility], whom family reports is no longer active in patient's care.  Mobility/ safety:  Patient verbalizes a desire for improved mobility, stating that he wants to be able to get out of bed and walk around his home, go outside, etc.  Patient states, "I think my biggest problem is because I am fat."  When questioned if he would be interested in home health [facility] PT to improve his mobility, patient states that he "doesn't need any *gd* medical person coming here, I can do it myself."  Patient flatly refused desire to have assistance with mobility needs through [facility].  Patient is able to sit on side of his bed with assistance from caregiver Leonard Montoya, but is unable to stand or bear weight.  Patient has a triangle bar over the head of his bed, which allows him to reposition himself while in bed.    Patient reports that he has a motorized wheelchair which "needs a new battery," stating that his "GD" "daughter will not get me a new battery, even though" he is able to pay for the battery.  Daughter reports that they have not gotten a new battery  for the wheelchair due to safety considerations as patient has had multiple falls patient incurred trying to get in and out of the wheelchair.  Patient privately reports that he "has never fallen" in his "entire life."  Daughter/ [place], Leonard Campanile, as well as son Leonard Montoya and caregiver Leonard Montoya, all report that EMS has had to be called numerous times to assist patient after he has fallen out of bed attempting to get in and out of motorized wheelchair.  Patient asks that I document his desire to have a "track system installed in  the ceiling," which he states will help him with getting out of bed on his own, "without anyone having to help me."  Encouraged patient to share his concerns around mobility with PCP during upcoming appointment.  Provider appointments:  Patient acknowledges upcoming PCP provider appointment on July 04, 2016 and states the "only reason" he has agreed to go to this appointment "is to get a flu shot."  Patient verbalizes that he believes he "doesn't need to go to any GD doctor so they can put needles in me and try to tell me what I can and can't do."  Patient states that the last time he went to see a doctor, he "knew more than she did."  Patient also acknowledges an upcoming dental appointment planned for July 09, 2016, which he plans to attend.  Advanced Directives: Patient acknowledges that his daughter Leonard Campanile is currently his [facility] POA, and states, "I sure would like to change that-- she is a GD Bi**ch!"  Patient states that "no one ever comes to check on me, they keep me locked up in this room all day long."  Patient states that he would like to have "a test to show that I can make my own decisions and take her off my POA papers."  Encouraged patient to share his concerns with PCP during upcoming appointment.  Family reports that although patient usually can accurately answer basic questions, that patient "does not have his full senses about him," and report that patient is angry, confrontational/ argumentative, and at times combative with them.  Family/ caregiver report that patient "will not make safe choices for himself," and verbalize a desire to keep patient safe.  Discussed issues extensively with patient's family/ caregiver, and encouraged family to communicate with PCP regarding their concerns.  Today, patient is verbally confrontational with me on several occasions while I performed my assessment and cursed throughout our interaction.  From an overall standpoint, patient was unreceptive to  my being there,and stated, "the only reason I am okay with you being here is if it helps me get transportation to get my flu shot and go to the dentist."   Subjective: "I know what I need and what is best for me-- I don't need any 'GD' doctor to tell me what I can and can't do... I want to be able to get up and out of this bed and to get around my house and go outside whenever I want to."  Objective:    BP 134/82   Pulse 82   Resp 18   SpO2 97%    Review of Systems  Constitutional: Negative for chills and fever.       Obese  Respiratory: Negative for cough, shortness of breath and wheezing.   Cardiovascular: Positive for leg swelling. Negative for chest pain.       Bilateral LE with +2 pitting edema; (L) slightly > (R); difficult to palpate DP's secondary to LE swelling;  thick dark patchy skin noted over bilateral LE's  Gastrointestinal: Negative for abdominal pain and nausea.  Genitourinary:       Patient is incontinent of urine and stool; family reports patient refuses to use urinal or bedpan and eliminates "in the bed."  Musculoskeletal: Positive for falls.       Patient states that he "never fallen," although family report that he has had multiple falls, requiring EMS to be called to put him back in bed  Neurological: Positive for speech change.       Patient states that his speech has been "a little slurred" since his hospital admission this past Spring  Psychiatric/Behavioral: The patient is not nervous/anxious.        Patient is confrontational and curses throughout visit today    Physical Exam  Constitutional: He is oriented to person, place, and time. He appears well-developed and well-nourished. No distress.  Cardiovascular: Normal rate, regular rhythm, normal heart sounds and intact distal pulses.   Pulses:      Radial pulses are 2+ on the right side, and 2+ on the left side.  Unable to definitively palpate bilateral DP's due to bilateral LE swelling/ edema   Respiratory: Effort normal and breath sounds normal. No respiratory distress. He has no wheezes. He has no rales.  GI: Soft.  Patient is obese  Musculoskeletal: He exhibits edema.  Bilateral LE swelling with dark patchy skin noted on bilateral ankles  Neurological: He is alert and oriented to person, place, and time.  Skin: Skin is warm and dry.  Psychiatric: His affect is angry and inappropriate. His speech is slurred. He is agitated.  Patient is A/O x 3, but demonstrates inconsistent reports of his history and care needs; patient is confrontational in his verbal communication, curses throughout visit.  Speech is slow, and patient reports slightly "slurred," although slurring of speech is not obvious today.  Patient appears somewhat agitated by my presence in his home today; see narrative     Encounter Medications:   Outpatient Encounter Prescriptions as of 06/21/2016  Medication Sig  . albuterol (PROVENTIL HFA;VENTOLIN HFA) 108 (90 Base) MCG/ACT inhaler Inhale 1-2 puffs into the lungs every 6 (six) hours as needed for wheezing. (Patient not taking: Reported on 10/21/2015)  . diltiazem (CARDIZEM CD) 180 MG 24 hr capsule Take 1 capsule (180 mg total) by mouth daily. (Patient not taking: Reported on 06/21/2016)  . furosemide (LASIX) 40 MG tablet Take 1 tablet (40 mg total) by mouth 2 (two) times daily.  Marland Kitchen. LORazepam (ATIVAN) 2 MG/ML concentrated solution Take 0.5 mLs (1 mg total) by mouth every 4 (four) hours as needed for anxiety.  Marland Kitchen. morphine (ROXANOL) 20 MG/ML concentrated solution Take 0.25 mLs (5 mg total) by mouth every 2 (two) hours as needed for severe pain.  . tamsulosin (FLOMAX) 0.4 MG CAPS capsule Take 1 capsule (0.4 mg total) by mouth daily. (Patient not taking: Reported on 10/21/2015)   No facility-administered encounter medications on file as of 06/21/2016.     Functional Status:   In your present state of health, do you have any difficulty performing the following activities:  06/14/2016 10/23/2015  Hearing? Malvin JohnsY Y  Vision? Y N  Difficulty concentrating or making decisions? Malvin JohnsY Y  Walking or climbing stairs? Y Y  Dressing or bathing? Y Y  Doing errands, shopping? Y -  Quarry managerreparing Food and eating ? Y -  Using the Toilet? Y -  In the past six months, have you accidently leaked urine? Y -  Do you have problems with loss of bowel control? Y -  Managing your Medications? Y -  Managing your Finances? Y -  Housekeeping or managing your Housekeeping? Y -  Some recent data might be hidden    Fall/Depression Screening:    PHQ 2/9 Scores 05-Jul-202017 08/03/2015 08/01/2015 09/27/2013  PHQ - 2 Score - 0 0 1  Exception Documentation Other- indicate reason in comment box - Patient refusal -  Not completed call completed with dtr - - -    Assessment:  Patient is unreceptive to/ non compliant with overall plan of care and refuses to take his medications or have assistance through [facility] services for PT, although he expresses a desire to have improved mobility. Although patient is A/O x 3 today, he appears to be a poor historian, and believes he doesn't need medical help.  Family dynamics are complex between patient and his adult children, especially with his daughter, whom is patient's HCPOA.  Patient would like to have a test performed to prove he is competent, and family members disagree that patient is competent.  Family members would like more information on care options available to patient, although they acknowledge his lack of receptiveness to receiving additional care.  Plan:   Patient's family/ caregiver will work with [facility] CSW to obtain transportation to upcoming PCP provider appointment scheduled for July 04, 2016, and to discuss additional care options available to patient.  Patient will attend upcoming PCP provider appointment and will discuss his concerns with mobility and his desire to have a test which proves his competence.  Patient's family will communicate with PCP to  voice their concerns with patient's current level of care needs and safety within home.  [facility] Community CM outreach to continue with scheduled phone call in 2 and a half weeks.    I appreciate the opportunity to participate in Mr. Inlow's care,  Caryl Pina, RN, BSN, CCRN [employer] [facility] Coordinator [facility] Care Management  [phone]

## 2016-06-22 DIAGNOSIS — L89222 Pressure ulcer of left hip, stage 2: Secondary | ICD-10-CM | POA: Diagnosis not present

## 2016-06-22 DIAGNOSIS — R3981 Functional urinary incontinence: Secondary | ICD-10-CM | POA: Diagnosis not present

## 2016-06-22 DIAGNOSIS — I5040 Unspecified combined systolic (congestive) and diastolic (congestive) heart failure: Secondary | ICD-10-CM | POA: Diagnosis not present

## 2016-06-22 DIAGNOSIS — N400 Enlarged prostate without lower urinary tract symptoms: Secondary | ICD-10-CM | POA: Diagnosis not present

## 2016-06-22 DIAGNOSIS — J449 Chronic obstructive pulmonary disease, unspecified: Secondary | ICD-10-CM | POA: Diagnosis not present

## 2016-06-22 DIAGNOSIS — R296 Repeated falls: Secondary | ICD-10-CM | POA: Diagnosis not present

## 2016-06-22 DIAGNOSIS — M623 Immobility syndrome (paraplegic): Secondary | ICD-10-CM | POA: Diagnosis not present

## 2016-06-22 DIAGNOSIS — M6281 Muscle weakness (generalized): Secondary | ICD-10-CM | POA: Diagnosis not present

## 2016-06-22 DIAGNOSIS — F419 Anxiety disorder, unspecified: Secondary | ICD-10-CM | POA: Diagnosis not present

## 2016-06-24 ENCOUNTER — Other Ambulatory Visit: Payer: Self-pay | Admitting: *Deleted

## 2016-06-24 NOTE — Patient Outreach (Signed)
[facility]) Care Management  06/24/2016  Leonard LowesJoel A Montoya 04/08/1941 [id number]   Phone call to [employer] [facility] [facility] [employer] [employer] to arrange transportation for patient to see his primary care doctor on 07/04/16 at 10:30am.  Spoke with Leonard Montoya, confirmation number [phone].  Patient's daughter Leonard Montoya to be notified of transportation arrangements made and that she will be allowed to ride with patient to his appointment. Patient will need to call [phone] when ready to be picked up from appointment.   Leonard ReamsChrystal Land, LCSW [facility] Care Management [phone]

## 2016-06-28 ENCOUNTER — Other Ambulatory Visit: Payer: Self-pay | Admitting: *Deleted

## 2016-06-28 ENCOUNTER — Encounter: Payer: Self-pay | Admitting: *Deleted

## 2016-06-28 NOTE — Patient Outreach (Signed)
[facility]) Care Management  06/28/2016  Leonard LowesJoel A Montoya 12/19/1940 [id number]   Phone call from patient's daughter to discuss transportation resources and caregiver support.  This Child psychotherapistsocial worker informed patient's daughter of the transportation arranged for his next appointment to his providers office and gave her the phone number to the [employer] to use for future transportation arrangements. Patient;s daughter discussed patient's verbally aggressive behavior and inability to effectively take care of his ADL's and IADL's on his own. Competency testing discussed, however patient's daughter has declined this option at this time. She will continue to oversee his care as his Power of [address]. Self care emphasized as per patient's daughter, she is often the target of patient's difficult behavior. Per patient's daughter, patient has always been stubborn and an intellectual but she is starting to see cognitive decline.  [employer] information provided for [employer] for caregiver support and education related to Dementia.  Patient's daughter is happy with current private duty aid and plans to add an additional aid with her recommendation to off set the days that the current aid is not available.  Additional community resource needs explored.  Patient's daughter expressed no additional needs at this time. Patient to be closed to social work at this time.   Adriana ReamsChrystal Land, LCSW [facility] Care Management [phone]

## 2016-07-04 ENCOUNTER — Other Ambulatory Visit: Payer: Self-pay | Admitting: *Deleted

## 2016-07-04 ENCOUNTER — Encounter: Payer: Self-pay | Admitting: Family Medicine

## 2016-07-04 ENCOUNTER — Ambulatory Visit (INDEPENDENT_AMBULATORY_CARE_PROVIDER_SITE_OTHER): Payer: Commercial Managed Care - HMO | Admitting: Family Medicine

## 2016-07-04 ENCOUNTER — Telehealth: Payer: Self-pay | Admitting: Family Medicine

## 2016-07-04 VITALS — BP 160/92 | HR 64 | Temp 98.5°F | Resp 20

## 2016-07-04 DIAGNOSIS — Z23 Encounter for immunization: Secondary | ICD-10-CM

## 2016-07-04 DIAGNOSIS — M6281 Muscle weakness (generalized): Secondary | ICD-10-CM | POA: Diagnosis not present

## 2016-07-04 DIAGNOSIS — I5022 Chronic systolic (congestive) heart failure: Secondary | ICD-10-CM | POA: Diagnosis not present

## 2016-07-04 DIAGNOSIS — N400 Enlarged prostate without lower urinary tract symptoms: Secondary | ICD-10-CM | POA: Diagnosis not present

## 2016-07-04 DIAGNOSIS — G9389 Other specified disorders of brain: Secondary | ICD-10-CM | POA: Diagnosis not present

## 2016-07-04 DIAGNOSIS — F419 Anxiety disorder, unspecified: Secondary | ICD-10-CM | POA: Diagnosis not present

## 2016-07-04 DIAGNOSIS — R2681 Unsteadiness on feet: Secondary | ICD-10-CM | POA: Diagnosis not present

## 2016-07-04 DIAGNOSIS — I5040 Unspecified combined systolic (congestive) and diastolic (congestive) heart failure: Secondary | ICD-10-CM | POA: Diagnosis not present

## 2016-07-04 DIAGNOSIS — R296 Repeated falls: Secondary | ICD-10-CM | POA: Diagnosis not present

## 2016-07-04 DIAGNOSIS — R413 Other amnesia: Secondary | ICD-10-CM | POA: Diagnosis not present

## 2016-07-04 DIAGNOSIS — I481 Persistent atrial fibrillation: Secondary | ICD-10-CM | POA: Diagnosis not present

## 2016-07-04 DIAGNOSIS — I10 Essential (primary) hypertension: Secondary | ICD-10-CM

## 2016-07-04 DIAGNOSIS — R6889 Other general symptoms and signs: Secondary | ICD-10-CM | POA: Diagnosis not present

## 2016-07-04 DIAGNOSIS — I4819 Other persistent atrial fibrillation: Secondary | ICD-10-CM

## 2016-07-04 LAB — CBC WITH DIFFERENTIAL/PLATELET
BASOS ABS: 0 {cells}/uL (ref 0–200)
Basophils Relative: 0 %
EOS ABS: 124 {cells}/uL (ref 15–500)
Eosinophils Relative: 1 %
HEMATOCRIT: 45.1 % (ref 38.5–50.0)
HEMOGLOBIN: 14.4 g/dL (ref 13.0–17.0)
LYMPHS ABS: 1860 {cells}/uL (ref 850–3900)
Lymphocytes Relative: 15 %
MCH: 30.3 pg (ref 27.0–33.0)
MCHC: 31.9 g/dL — AB (ref 32.0–36.0)
MCV: 94.9 fL (ref 80.0–100.0)
MONO ABS: 868 {cells}/uL (ref 200–950)
MPV: 11.9 fL (ref 7.5–12.5)
Monocytes Relative: 7 %
NEUTROS ABS: 9548 {cells}/uL — AB (ref 1500–7800)
NEUTROS PCT: 77 %
Platelets: 213 10*3/uL (ref 140–400)
RBC: 4.75 MIL/uL (ref 4.20–5.80)
RDW: 14.2 % (ref 11.0–15.0)
WBC: 12.4 10*3/uL — ABNORMAL HIGH (ref 3.8–10.8)

## 2016-07-04 LAB — COMPLETE METABOLIC PANEL WITH GFR
ALBUMIN: 3.9 g/dL (ref 3.6–5.1)
ALK PHOS: 52 U/L (ref 40–115)
ALT: 14 U/L (ref 9–46)
AST: 16 U/L (ref 10–35)
BILIRUBIN TOTAL: 0.5 mg/dL (ref 0.2–1.2)
BUN: 20 mg/dL (ref 7–25)
CALCIUM: 9.3 mg/dL (ref 8.6–10.3)
CO2: 23 mmol/L (ref 20–31)
Chloride: 106 mmol/L (ref 98–110)
Creat: 0.93 mg/dL (ref 0.70–1.18)
GFR, Est African American: >89 mL/min (ref >=60)
GFR, Est Non African American: 79 mL/min (ref >=60)
GLUCOSE: 111 mg/dL — AB (ref 70–99)
POTASSIUM: 4.3 mmol/L (ref 3.5–5.3)
Sodium: 142 mmol/L (ref 135–146)
TOTAL PROTEIN: 6.6 g/dL (ref 6.1–8.1)

## 2016-07-04 NOTE — Telephone Encounter (Signed)
Son stopped by the front desk asking for a new order to be faxed to [facility] for hospital bed, hoyer lift, trapeze bar. He is requesting the oxygenator to be picked up by [facility] since patient doesn't use it.  He says in other words everything that he has had ordered previously via [facility] he is requesting again other than the oxygenator.   CB# [phone]

## 2016-07-04 NOTE — Telephone Encounter (Signed)
Please call Forbes CellarSandy Mericka [phone] she is the one that knows what patient needs.

## 2016-07-04 NOTE — Patient Outreach (Signed)
[facility] ([facility]) Care Management  07/04/2016  Leonard LowesJoel A Montoya 09/07/1940 [id number]   [facility]. Patient to be picked up at 12:15pm for the 1:30pm appointment. Confirmation number [id number] . Phone number to call to be picked up when ready is [phone].    Adriana ReamsChrystal Land, LCSW [facility] Care Management [phone]

## 2016-07-05 NOTE — Progress Notes (Signed)
Subjective:    Patient ID: Leonard Montoya, male    DOB: 03-Jul-1941, 75 y.o.   MRN: [id number]  HPI Patient is here today to establish care. This is an extremely complicated social situation. I have reviewed his discharge summary from the hospital 10/25/2015 as well as the recent [facility] home visit performed by Reginia Naas RN on 06/21/16.  Please see those 2 notes for further background to get a better understanding of the patient's medical history and social situation. I will paraphrase for my record.  Apparently he was admitted at the end of March with expressive aphasia, sepsis due to community-acquired infection as well as urinary tract infection. He was unable to have an MRI of the brain due to his morbid obesity. At that time a CT scan did show chronic normal pressure hydrocephalus with ventriculomegaly out of proportion to his brain atrophy. However his daughter declined neurology consultation, neurosurgery consultation, further workup at that time and elected hospice/palliative care.  She is his healthcare power of attorney. Apparently also has chronic atrial fibrillation but due to memory loss and personality changes and behavior refuses to take any medication. Therefore he is on no anticoagulation or rate controlling agent. He also has congestive heart failure with an ejection fraction of 40-45% on echocardiogram. However due to his refusal to take any medication, this is not adequately treated. He is here today in the room alone. He was brought by EMS because of his morbid obesity and his inability to stand or walk. He is in a wheelchair with a catheter in place due to chronic incontinence possibly stemming from his normal pressure hydrocephalus. His son is with him. I spoke to his son outside of the room. Apparently his son does not live locally and therefore is unable to provide any real context regarding when the patient's memory loss and behavior changes started, how his daughter became healthcare  power of attorney.  The daughter is not present because the patient cannot stand her. In fact he calls her a "G..D....B..ch" when discussing her with me. If you review the home visit by [facility], this provides excellent contextual formation.  Today the patient is alert and oriented 3. He is able to correctly tell me that some of the December, that is 2017. He is unable to tell me the date but quickly notes that all he does is laying bed during the day with no oriented to himself showing self-awareness.  When I ask him his location. He has a difficult time telling me where he said because his family lied to him to get him to come to the visit. He was told he was going to the dentist. He did not realize that he was going to the doctor's office until he arrived. Otherwise he states that he would not have come. However he does state that we must be near [place] given the length of time he wrote in the ambulance. He is also able to correctly tell me that this is a doctor's office and he remembers my name.  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 He speaks fluently in coherent sentences.  On rapid recall. He is only able to remember 1 out of 3 objects. He is able to spell "world" backwards with no difficulty. However he had a tremendous amount of difficulty in serial sevens and only scored 2 out of 5 in that area. Therefore his Mini-Mental status exam was at best a  25 out of 30 which is markedly abnormal given his level of education. On clock drawing evaluation, he is able to correctly draw a clock with the appropriate number placement and correctly show the time with short and long hand placement properly, however this took a fair amount of time and was honestly taxing for the patient. Therefore there is obviously an element of dementia. The only medication that the patient takes Seroquel 0.5 mg twice a day which his family crushes and hives in his food to help control his  aggressive behavior and mood swings. He is not aware that he takes this. He takes Tylenol occasionally for pain.  Past Medical History:  Diagnosis Date  . Allergy 1978   rag weed, dust, animal dander, pollen  . Arthritis   . Asthma   . Atrial fibrillation ([place])   . BPH (benign prostatic hypertrophy)   . Chronic insomnia   . Gout   . Hyperlipidemia   . Hypertension   . Obesity 1974   No past surgical history on file.  Meds- Seroquel 25 bid  Allergies  Allergen Reactions  . Sudafed [Pseudoephedrine Hcl]     Weird Dreams    Social History   Social History  . Marital status: Single    Spouse name: N/A  . Number of children: N/A  . Years of education: N/A   Occupational History  . Not on file.   Social History Main Topics  . Smoking status: Former Smoker    Quit date: 07/23/1991  . Smokeless tobacco: Never Used  . Alcohol use No  . Drug use: No  . Sexual activity: Not Currently   Other Topics Concern  . Not on file   Social History Narrative  . No narrative on file   Family History  Problem Relation Age of Onset  . Arthritis Mother   . Alcohol abuse Father   . Arthritis Father   . Diabetes Father   . Drug abuse Brother   . Learning disabilities Brother       Review of Systems  All other systems reviewed and are negative.      Objective:   Physical Exam  Constitutional: He appears well-developed and well-nourished.  HENT:  Head: Normocephalic and atraumatic.  Right Ear: External ear normal.  Left Ear: External ear normal.  Nose: Nose normal.  Mouth/Throat: Oropharynx is clear and moist. No oropharyngeal exudate.  Eyes: Conjunctivae and EOM are normal. Pupils are equal, round, and reactive to light.  Neck: Neck supple. No JVD present.  Cardiovascular: Normal rate.  An irregularly irregular rhythm present.  Pulmonary/Chest: Effort normal and breath sounds normal. No respiratory distress. He has no wheezes. He has no rales.  Abdominal: Soft. Bowel  sounds are normal. He exhibits no distension. There is no tenderness. There is no rebound and no guarding.  Musculoskeletal: He exhibits edema.  Lymphadenopathy:    He has no cervical adenopathy.  Psychiatric: He has a normal mood and affect. His speech is normal and behavior is normal. Thought content normal. Cognition and memory are impaired. He expresses inappropriate judgment.  Vitals reviewed.         Assessment & Plan:  Memory loss - Plan: CBC with Differential/Platelet, COMPLETE METABOLIC PANEL WITH GFR, MR Brain W Wo Contrast, Ambulatory referral to Neurology  Need for prophylactic vaccination against Streptococcus pneumoniae (pneumococcus) and influenza - Plan: Flu Vaccine QUAD 36+ mos IM, Pneumococcal polysaccharide vaccine 23-valent greater than or equal to 2yo subcutaneous/IM  Essential hypertension, benign  Persistent atrial fibrillation (HCC)  Chronic systolic congestive heart failure (HCC)  Morbid obesity (HCC)  Gait instability - Plan: Ambulatory referral to Physical Therapy, MR Brain W Wo Contrast, Ambulatory referral to Neurology  Cerebral ventriculomegaly - Plan: MR Brain W Wo Contrast, Ambulatory referral to Neurology This is an extremely difficult case only made more complicated given the fact I have never met the patient before so I have limited frame of reference. Without any close family present, his son is only able to tell me that they want the patient to take more Seroquel to try to control his behavior. Given our conversations and many of the notes in his record I certainly can understand this and believe that that is appropriate. I would increase seroquel to 50 mg twice a day. However I believe the patient would benefit from further workup as there may be better means to help control or even treat his condition. If he has normal pressure hydrocephalus, this could possibly explain his mood swings, memory loss, and unusual behavior. He could also possibly  explain his gait instability (although this is certainly impacted by atrophy, deconditioning, and morbid obesity). Therefore I believe we need to get an MRI to characterize this further. I'm also concerned that the patient may have dementia and given his aggressive behavior, I will consult neurology to evaluate for possible Lewy body dementia or frontotemporal dementia. I have recommended this to the patient and his son separately. I have also recommended physical therapy as he is extremely high fall risk. Perhaps home physical therapy could help with ambulation. Since his daughter who is the healthcare power of attorney is not present, I am unable to ask her why she refused these things in the past and if that would be the patient's wishes. However today the patient is bright and able to answer questions quickly and he seems to understand the consequences of his decisions. I do believe that his memory, judgment, and behavior is impaired and at times inappropriate. Therefore I do believe that he needs a health care power of attorney. However I am not sure that her decisions in the past are in keeping with his wishes as the patient does not appear to be in a state requiring hospice at the present time

## 2016-07-05 NOTE — Telephone Encounter (Signed)
LM for Leonard Montoya to return my call

## 2016-07-08 ENCOUNTER — Other Ambulatory Visit: Payer: Self-pay | Admitting: Family Medicine

## 2016-07-08 MED ORDER — QUETIAPINE FUMARATE 50 MG PO TABS: 50.0000 mg | ORAL_TABLET | Freq: Every day | ORAL | 3 refills | Status: DC

## 2016-07-08 NOTE — Telephone Encounter (Signed)
[facility] and she does not need bed, lift or bar but does need to have oxygen concentrator removed as he refues to use that. Will send in order to dc o2 concentrator.

## 2016-07-09 NOTE — Telephone Encounter (Signed)
Sandi aware to come in Thursday around 4:45 pm to discuss issues about pt

## 2016-07-10 ENCOUNTER — Encounter: Payer: Self-pay | Admitting: *Deleted

## 2016-07-10 ENCOUNTER — Other Ambulatory Visit: Payer: Self-pay | Admitting: *Deleted

## 2016-07-10 NOTE — Telephone Encounter (Signed)
[facility]

## 2016-07-10 NOTE — Patient Outreach (Signed)
[facility]) [facility] CM Telephone Outreach/ Case Closure 07/10/2016  Fran LowesJoel A Stitt 06/14/1941 [id number]  Successful telephone outreach to Forbes CellarSandy Mericka, daughter and HCPOA of Fran LowesJoel A Ditto, 75 y.o. male [facility] by patient's daughter on advice from patient's PCP for ongoing care needs. [facility] CM spoke with patient's daughter initially and [facility] CM and CSW for further in- home assessment of patient care needs. Patient has history including, but not limited to, HTN, CHF, Atrial fibrillation with RVR, morbid obesity, dementia, HLD, and asthma. HIPAA/ identity verified during phone call today with Forbes CellarSandy Mericka.  Today, Andrey CampanileSandy reports that Mr. Alvester Morinewton "is about the same."   --Patient attended PCP visit to establish care on July 04, 2016.  Andrey CampanileSandy reports patient got his flu/ pneumonia vaccines and that Seroquel dosing was increased during PCP visit.  Andrey CampanileSandy reports that PCP ordered MRI, neurology consultation, and ambulatory PT.  Andrey CampanileSandy reports that based on patient's feedback since that appointment, she "doubts" patient will agree to attend neurologist office visit, schedule MRI, or participate in PT.  Andrey CampanileSandy states that based on her brother's report (brother Vonna KotykJay, son of patient, lives with patient), that the increased dosing of Seroquel "has helped somewhat," but reports that patient remains angry/ hostile as well as very resistant to complying with established plan of care after recent PCP visit.  -- Patient cancelled his dental appointment yesterday, after transportation through his insurance company was previously arranged [facility] to attend this appointment.  Andrey CampanileSandy states that he "refused" to attend appointment, and stated that he did not "feel well enough" to attend appointment.  [facility] CSW involvement in patient care has since been closed.  -- Andrey CampanileSandy reports that she has an appointment with patient's newly  established PCP, Dr. Tanya NonesPickard, tomorrow.  Andrey CampanileSandy is very receptive/ eager to discuss patient's care with Dr. Tanya NonesPickard, as she "doesn't know where to turn," given that patient continually refuses care that is offered by his medical care team and his family.  Sandy and I had a long discussion today regarding patient's/ family's options, given that patient appears to be competent from a basic standpoint, and continues to refuse any and all medical treatment/ involvement, despite patient's own report previously that he wanted testing to "prove" his competence.  Because patient appears to be (by definition) competent from a basic standpoint, Sandy and I agreed that [facility] CM case will be closed, as patient is unreceptive to [facility] involvement in his care.  I confirmed that Andrey CampanileSandy has my direct phone number, the main office phone number for [facility], and the [facility] CM 24-hour nurse line, should needs arise in the future where the patient agrees to [facility] CM involvement.  Sandy verbalized understanding and agreement with this plan.  Plan:  Will close patient's case to [facility] CM, as patient is unreceptive to [facility] involvement, and will make patient's PCP aware of same.  Caryl PinaLaine Mckinney Tousey, RN, BSN, [employer] Community Care Coordinator [facility] Care Management  [phone]

## 2016-07-23 DIAGNOSIS — L89222 Pressure ulcer of left hip, stage 2: Secondary | ICD-10-CM | POA: Diagnosis not present

## 2016-07-23 DIAGNOSIS — N400 Enlarged prostate without lower urinary tract symptoms: Secondary | ICD-10-CM | POA: Diagnosis not present

## 2016-07-23 DIAGNOSIS — J449 Chronic obstructive pulmonary disease, unspecified: Secondary | ICD-10-CM | POA: Diagnosis not present

## 2016-07-23 DIAGNOSIS — R296 Repeated falls: Secondary | ICD-10-CM | POA: Diagnosis not present

## 2016-07-23 DIAGNOSIS — F419 Anxiety disorder, unspecified: Secondary | ICD-10-CM | POA: Diagnosis not present

## 2016-07-23 DIAGNOSIS — R3981 Functional urinary incontinence: Secondary | ICD-10-CM | POA: Diagnosis not present

## 2016-07-23 DIAGNOSIS — M623 Immobility syndrome (paraplegic): Secondary | ICD-10-CM | POA: Diagnosis not present

## 2016-07-23 DIAGNOSIS — M6281 Muscle weakness (generalized): Secondary | ICD-10-CM | POA: Diagnosis not present

## 2016-07-23 DIAGNOSIS — I5040 Unspecified combined systolic (congestive) and diastolic (congestive) heart failure: Secondary | ICD-10-CM | POA: Diagnosis not present

## 2016-08-04 DIAGNOSIS — I5040 Unspecified combined systolic (congestive) and diastolic (congestive) heart failure: Secondary | ICD-10-CM | POA: Diagnosis not present

## 2016-08-04 DIAGNOSIS — F419 Anxiety disorder, unspecified: Secondary | ICD-10-CM | POA: Diagnosis not present

## 2016-08-04 DIAGNOSIS — R296 Repeated falls: Secondary | ICD-10-CM | POA: Diagnosis not present

## 2016-08-04 DIAGNOSIS — N400 Enlarged prostate without lower urinary tract symptoms: Secondary | ICD-10-CM | POA: Diagnosis not present

## 2016-08-04 DIAGNOSIS — M6281 Muscle weakness (generalized): Secondary | ICD-10-CM | POA: Diagnosis not present

## 2016-08-23 DIAGNOSIS — N400 Enlarged prostate without lower urinary tract symptoms: Secondary | ICD-10-CM | POA: Diagnosis not present

## 2016-08-23 DIAGNOSIS — F419 Anxiety disorder, unspecified: Secondary | ICD-10-CM | POA: Diagnosis not present

## 2016-08-23 DIAGNOSIS — L89222 Pressure ulcer of left hip, stage 2: Secondary | ICD-10-CM | POA: Diagnosis not present

## 2016-08-23 DIAGNOSIS — I5040 Unspecified combined systolic (congestive) and diastolic (congestive) heart failure: Secondary | ICD-10-CM | POA: Diagnosis not present

## 2016-08-23 DIAGNOSIS — R6889 Other general symptoms and signs: Secondary | ICD-10-CM | POA: Diagnosis not present

## 2016-08-23 DIAGNOSIS — R296 Repeated falls: Secondary | ICD-10-CM | POA: Diagnosis not present

## 2016-08-23 DIAGNOSIS — J449 Chronic obstructive pulmonary disease, unspecified: Secondary | ICD-10-CM | POA: Diagnosis not present

## 2016-08-23 DIAGNOSIS — R3981 Functional urinary incontinence: Secondary | ICD-10-CM | POA: Diagnosis not present

## 2016-08-23 DIAGNOSIS — M6281 Muscle weakness (generalized): Secondary | ICD-10-CM | POA: Diagnosis not present

## 2016-08-23 DIAGNOSIS — M623 Immobility syndrome (paraplegic): Secondary | ICD-10-CM | POA: Diagnosis not present

## 2016-09-04 DIAGNOSIS — R296 Repeated falls: Secondary | ICD-10-CM | POA: Diagnosis not present

## 2016-09-04 DIAGNOSIS — F419 Anxiety disorder, unspecified: Secondary | ICD-10-CM | POA: Diagnosis not present

## 2016-09-04 DIAGNOSIS — N400 Enlarged prostate without lower urinary tract symptoms: Secondary | ICD-10-CM | POA: Diagnosis not present

## 2016-09-04 DIAGNOSIS — M6281 Muscle weakness (generalized): Secondary | ICD-10-CM | POA: Diagnosis not present

## 2016-09-04 DIAGNOSIS — I5040 Unspecified combined systolic (congestive) and diastolic (congestive) heart failure: Secondary | ICD-10-CM | POA: Diagnosis not present

## 2016-09-06 DIAGNOSIS — R6889 Other general symptoms and signs: Secondary | ICD-10-CM | POA: Diagnosis not present

## 2016-09-06 DIAGNOSIS — K006 Disturbances in tooth eruption: Secondary | ICD-10-CM | POA: Diagnosis not present

## 2017-01-30 ENCOUNTER — Observation Stay (HOSPITAL_COMMUNITY): Payer: Medicare HMO

## 2017-01-30 ENCOUNTER — Encounter (HOSPITAL_COMMUNITY): Payer: Self-pay | Admitting: Neurology

## 2017-01-30 ENCOUNTER — Inpatient Hospital Stay (HOSPITAL_COMMUNITY)
Admission: EM | Admit: 2017-01-30 | Discharge: 2017-02-02 | DRG: 065 | Disposition: A | Discharge status: Home / Self Care | Payer: Medicare HMO | Attending: Internal Medicine | Admitting: Internal Medicine

## 2017-01-30 ENCOUNTER — Emergency Department (HOSPITAL_COMMUNITY): Payer: Medicare HMO

## 2017-01-30 DIAGNOSIS — H9193 Unspecified hearing loss, bilateral: Secondary | ICD-10-CM | POA: Diagnosis present

## 2017-01-30 DIAGNOSIS — I5042 Chronic combined systolic (congestive) and diastolic (congestive) heart failure: Secondary | ICD-10-CM | POA: Diagnosis present

## 2017-01-30 DIAGNOSIS — E119 Type 2 diabetes mellitus without complications: Secondary | ICD-10-CM | POA: Diagnosis present

## 2017-01-30 DIAGNOSIS — Z66 Do not resuscitate: Secondary | ICD-10-CM | POA: Diagnosis present

## 2017-01-30 DIAGNOSIS — I639 Cerebral infarction, unspecified: Secondary | ICD-10-CM

## 2017-01-30 DIAGNOSIS — B961 Klebsiella pneumoniae [K. pneumoniae] as the cause of diseases classified elsewhere: Secondary | ICD-10-CM | POA: Diagnosis present

## 2017-01-30 DIAGNOSIS — I482 Chronic atrial fibrillation: Secondary | ICD-10-CM

## 2017-01-30 DIAGNOSIS — Z811 Family history of alcohol abuse and dependence: Secondary | ICD-10-CM

## 2017-01-30 DIAGNOSIS — T83511A Infection and inflammatory reaction due to indwelling urethral catheter, initial encounter: Secondary | ICD-10-CM | POA: Diagnosis present

## 2017-01-30 DIAGNOSIS — Z888 Allergy status to other drugs, medicaments and biological substances status: Secondary | ICD-10-CM

## 2017-01-30 DIAGNOSIS — I69339 Monoplegia of upper limb following cerebral infarction affecting unspecified side: Secondary | ICD-10-CM

## 2017-01-30 DIAGNOSIS — I63532 Cerebral infarction due to unspecified occlusion or stenosis of left posterior cerebral artery: Secondary | ICD-10-CM | POA: Diagnosis not present

## 2017-01-30 DIAGNOSIS — N390 Urinary tract infection, site not specified: Secondary | ICD-10-CM | POA: Diagnosis present

## 2017-01-30 DIAGNOSIS — Z6839 Body mass index (BMI) 39.0-39.9, adult: Secondary | ICD-10-CM

## 2017-01-30 DIAGNOSIS — R4701 Aphasia: Secondary | ICD-10-CM | POA: Diagnosis present

## 2017-01-30 DIAGNOSIS — R471 Dysarthria and anarthria: Secondary | ICD-10-CM | POA: Diagnosis present

## 2017-01-30 DIAGNOSIS — Z7401 Bed confinement status: Secondary | ICD-10-CM

## 2017-01-30 DIAGNOSIS — I4891 Unspecified atrial fibrillation: Secondary | ICD-10-CM | POA: Diagnosis present

## 2017-01-30 DIAGNOSIS — Z9119 Patient's noncompliance with other medical treatment and regimen: Secondary | ICD-10-CM

## 2017-01-30 DIAGNOSIS — R29707 NIHSS score 7: Secondary | ICD-10-CM | POA: Diagnosis present

## 2017-01-30 DIAGNOSIS — Z833 Family history of diabetes mellitus: Secondary | ICD-10-CM

## 2017-01-30 DIAGNOSIS — R531 Weakness: Secondary | ICD-10-CM | POA: Diagnosis not present

## 2017-01-30 DIAGNOSIS — B9620 Unspecified Escherichia coli [E. coli] as the cause of diseases classified elsewhere: Secondary | ICD-10-CM | POA: Diagnosis present

## 2017-01-30 DIAGNOSIS — F5104 Psychophysiologic insomnia: Secondary | ICD-10-CM | POA: Diagnosis present

## 2017-01-30 DIAGNOSIS — R29810 Facial weakness: Secondary | ICD-10-CM | POA: Diagnosis present

## 2017-01-30 DIAGNOSIS — I6350 Cerebral infarction due to unspecified occlusion or stenosis of unspecified cerebral artery: Secondary | ICD-10-CM | POA: Diagnosis not present

## 2017-01-30 DIAGNOSIS — J45909 Unspecified asthma, uncomplicated: Secondary | ICD-10-CM | POA: Diagnosis present

## 2017-01-30 DIAGNOSIS — Y846 Urinary catheterization as the cause of abnormal reaction of the patient, or of later complication, without mention of misadventure at the time of the procedure: Secondary | ICD-10-CM | POA: Diagnosis present

## 2017-01-30 DIAGNOSIS — R4781 Slurred speech: Secondary | ICD-10-CM

## 2017-01-30 DIAGNOSIS — I10 Essential (primary) hypertension: Secondary | ICD-10-CM | POA: Diagnosis present

## 2017-01-30 DIAGNOSIS — R29712 NIHSS score 12: Secondary | ICD-10-CM | POA: Diagnosis not present

## 2017-01-30 DIAGNOSIS — G4733 Obstructive sleep apnea (adult) (pediatric): Secondary | ICD-10-CM | POA: Diagnosis present

## 2017-01-30 DIAGNOSIS — Z87891 Personal history of nicotine dependence: Secondary | ICD-10-CM

## 2017-01-30 DIAGNOSIS — E785 Hyperlipidemia, unspecified: Secondary | ICD-10-CM | POA: Diagnosis present

## 2017-01-30 DIAGNOSIS — M109 Gout, unspecified: Secondary | ICD-10-CM | POA: Diagnosis present

## 2017-01-30 DIAGNOSIS — I110 Hypertensive heart disease with heart failure: Secondary | ICD-10-CM | POA: Diagnosis present

## 2017-01-30 DIAGNOSIS — G8191 Hemiplegia, unspecified affecting right dominant side: Secondary | ICD-10-CM | POA: Diagnosis present

## 2017-01-30 LAB — I-STAT CHEM 8, ED
BUN: 33 mg/dL — ABNORMAL HIGH (ref 6–20)
CALCIUM ION: 1.02 mmol/L — AB (ref 1.15–1.40)
CREATININE: 1.1 mg/dL (ref 0.61–1.24)
Chloride: 96 mmol/L — ABNORMAL LOW (ref 101–111)
GLUCOSE: 114 mg/dL — AB (ref 65–99)
HCT: 45 % (ref 39.0–52.0)
HEMOGLOBIN: 15.3 g/dL (ref 13.0–17.0)
POTASSIUM: 4 mmol/L (ref 3.5–5.1)
Sodium: 140 mmol/L (ref 135–145)
TCO2: 34 mmol/L (ref 0–100)

## 2017-01-30 LAB — COMPREHENSIVE METABOLIC PANEL
ALBUMIN: 3.5 g/dL (ref 3.5–5.0)
ALK PHOS: 54 U/L (ref 38–126)
ALT: 8 U/L — ABNORMAL LOW (ref 17–63)
ANION GAP: 11 (ref 5–15)
AST: 21 U/L (ref 15–41)
BUN: 26 mg/dL — ABNORMAL HIGH (ref 6–20)
CALCIUM: 9.1 mg/dL (ref 8.9–10.3)
CO2: 29 mmol/L (ref 22–32)
Chloride: 97 mmol/L — ABNORMAL LOW (ref 101–111)
Creatinine, Ser: 1.12 mg/dL (ref 0.61–1.24)
GFR calc non Af Amer: >60 mL/min (ref >60)
GLUCOSE: 114 mg/dL — AB (ref 65–99)
POTASSIUM: 4.2 mmol/L (ref 3.5–5.1)
SODIUM: 137 mmol/L (ref 135–145)
TOTAL PROTEIN: 6.5 g/dL (ref 6.5–8.1)
Total Bilirubin: 0.6 mg/dL (ref 0.3–1.2)

## 2017-01-30 LAB — CBC
HCT: 44.7 % (ref 39.0–52.0)
Hemoglobin: 14 g/dL (ref 13.0–17.0)
MCH: 28.9 pg (ref 26.0–34.0)
MCHC: 31.3 g/dL (ref 30.0–36.0)
MCV: 92.2 fL (ref 78.0–100.0)
PLATELETS: 158 10*3/uL (ref 150–400)
RBC: 4.85 MIL/uL (ref 4.22–5.81)
RDW: 14.7 % (ref 11.5–15.5)
WBC: 10.6 10*3/uL — ABNORMAL HIGH (ref 4.0–10.5)

## 2017-01-30 LAB — DIFFERENTIAL
BASOS PCT: 0 %
Basophils Absolute: 0 10*3/uL (ref 0.0–0.1)
EOS ABS: 0.3 10*3/uL (ref 0.0–0.7)
EOS PCT: 3 %
LYMPHS PCT: 24 %
Lymphs Abs: 2.6 10*3/uL (ref 0.7–4.0)
Monocytes Absolute: 0.9 10*3/uL (ref 0.1–1.0)
Monocytes Relative: 8 %
NEUTROS PCT: 65 %
Neutro Abs: 6.9 10*3/uL (ref 1.7–7.7)

## 2017-01-30 LAB — I-STAT TROPONIN, ED: TROPONIN I, POC: 0.03 ng/mL (ref 0.00–0.08)

## 2017-01-30 LAB — PROTIME-INR
INR: 1.06
Prothrombin Time: 13.9 seconds (ref 11.4–15.2)

## 2017-01-30 LAB — APTT: aPTT: 30 seconds (ref 24–36)

## 2017-01-30 MED ORDER — SENNOSIDES-DOCUSATE SODIUM 8.6-50 MG PO TABS: 1.0000 | ORAL_TABLET | Freq: Every evening | ORAL | Status: DC | PRN

## 2017-01-30 MED ORDER — ACETAMINOPHEN 325 MG PO TABS: 650.0000 mg | ORAL_TABLET | ORAL | Status: DC | PRN

## 2017-01-30 MED ORDER — ASPIRIN 300 MG RE SUPP: 300.0000 mg | Freq: Every day | RECTAL | Status: DC

## 2017-01-30 MED ORDER — SODIUM CHLORIDE 0.9 % IV SOLN: INTRAVENOUS | Status: AC

## 2017-01-30 MED ORDER — STROKE: EARLY STAGES OF RECOVERY BOOK
Freq: Once | Status: AC
  Administered 2017-01-30: 1

## 2017-01-30 MED ORDER — ALBUTEROL SULFATE (2.5 MG/3ML) 0.083% IN NEBU: 2.5000 mg | INHALATION_SOLUTION | Freq: Four times a day (QID) | RESPIRATORY_TRACT | Status: DC | PRN

## 2017-01-30 MED ORDER — ENOXAPARIN SODIUM 40 MG/0.4ML ~~LOC~~ SOLN
40.0000 mg | SUBCUTANEOUS | Status: DC
Start: 2017-01-30 — End: 2017-02-02
  Administered 2017-01-30: 40 mg via SUBCUTANEOUS
  Filled 2017-01-30 (×3): qty 0.4

## 2017-01-30 MED ORDER — QUETIAPINE FUMARATE 50 MG PO TABS
50.0000 mg | ORAL_TABLET | Freq: Every day | ORAL | Status: DC
  Administered 2017-02-01: 50 mg via ORAL
  Filled 2017-01-30 (×3): qty 1

## 2017-01-30 MED ORDER — ACETAMINOPHEN 160 MG/5ML PO SOLN: 650.0000 mg | ORAL | Status: DC | PRN

## 2017-01-30 MED ORDER — SODIUM CHLORIDE 0.9 % IV BOLUS (SEPSIS)
1000.0000 mL | Freq: Once | INTRAVENOUS | Status: AC
  Administered 2017-01-30: 1000 mL via INTRAVENOUS

## 2017-01-30 MED ORDER — ASPIRIN 325 MG PO TABS
325.0000 mg | ORAL_TABLET | Freq: Every day | ORAL | Status: DC
  Administered 2017-01-31 – 2017-02-02 (×3): 325 mg via ORAL
  Filled 2017-01-30 (×4): qty 1

## 2017-01-30 MED ORDER — ACETAMINOPHEN 650 MG RE SUPP: 650.0000 mg | RECTAL | Status: DC | PRN

## 2017-01-30 MED ORDER — METOPROLOL TARTRATE 25 MG PO TABS
25.0000 mg | ORAL_TABLET | Freq: Two times a day (BID) | ORAL | Status: DC
  Administered 2017-01-31 – 2017-02-02 (×5): 25 mg via ORAL
  Filled 2017-01-30 (×7): qty 1

## 2017-01-30 NOTE — ED Notes (Signed)
PT returns from MRI 

## 2017-01-30 NOTE — ED Notes (Signed)
Patient failed his swallow evaluation. He started coughing, and choking. Speech evaluation ordered.

## 2017-01-30 NOTE — Progress Notes (Signed)
Patient very agitated. MD paged for agitation, NPO clarification and BP regulation. RN will continue to monitor patient.

## 2017-01-30 NOTE — ED Provider Notes (Signed)
[facility]-EMER[facility]ENCY DEPT Provider Note   CSN: [id number] Arrival date & time: 01/30/17  1233     History   Chief Complaint Chief Complaint  Patient presents with  . Aphasia    HPI Leonard Montoya is a 76 y.o. male is brought to ED for evaluation of slurred speech since approx 1130 pm yesterday. Patient is "guessing" this time frame, son at bedside who lives with him noticed slurred speech this morning. Patient is a poor historian. Patient has documented h/o CVA, morbid obesity, OSA, HTN, medical noncompliance, atrial fibrillation and combined CHF by provider on 12/20/16. Patient's son states patient is non compliant with any of his medications. Does not like to go to the doctors. Only takes spironolactone, lasix and seroquel only sometimes when son and aide can "trick him" and sprinkle medications in his food. Does not take any medications for HTN, atrial fibrillation, HLD.   Denies associated HA, visual changes, CP, SOB, palpitations, abdominal pain, nausea, vomiting, urinary symptoms, increased focal weakness or numbness to extremities. Chronic right sided upper extremity weakness from previous stroke, per son. Patient is bed bound with condom cath, does not ambulate due to "obesity" and bad knees.   HPI  Past Medical History:  Diagnosis Date  . Allergy 1978   rag weed, dust, animal dander, pollen  . Arthritis   . Asthma   . Atrial fibrillation (HCC)   . BPH (benign prostatic hypertrophy)   . Chronic insomnia   . Gout   . Hyperlipidemia   . Hypertension   . Obesity 1974    Patient Active Problem List   Diagnosis Date Noted  . Shortness of breath   . Encounter for palliative care   . Goals of care, counseling/discussion   . Pressure ulcer 10/23/2015  . Acute congestive heart failure (HCC)   . Atrial fibrillation with rapid ventricular response (HCC) 10/21/2015  . Sepsis (HCC) 10/21/2015  . AKI (acute kidney injury) (HCC) 10/21/2015  . Acute CHF (congestive heart failure) (HCC)  10/21/2015  . Thrombocytopenia (HCC) 10/21/2015  . CAP (community acquired pneumonia) 10/21/2015  . Gait instability 10/18/2013  . At high risk for falls 09/28/2013  . Chronic insomnia 09/28/2013  . Essential hypertension, benign 09/27/2013  . Atrial fibrillation (HCC) 09/27/2013  . Morbid obesity (HCC) 09/27/2013  . Omphalitis in adult 09/27/2013  . Asthma, chronic 09/27/2013  . Venous stasis dermatitis 09/27/2013  . Other and unspecified hyperlipidemia 09/27/2013    History reviewed. No pertinent surgical history.     Home Medications    Prior to Admission medications   Medication Sig Start Date End Date Taking? Authorizing Provider  albuterol (PROVENTIL HFA;VENTOLIN HFA) 108 (90 Base) MCG/ACT inhaler Inhale 1-2 puffs into the lungs every 6 (six) hours as needed for wheezing. Patient not taking: Reported on 07/04/2016 09/12/15   Rolland Porter, MD  diltiazem (CARDIZEM CD) 180 MG 24 hr capsule Take 1 capsule (180 mg total) by mouth daily. Patient not taking: Reported on 07/04/2016 09/12/15   Rolland Porter, MD  furosemide (LASIX) 40 MG tablet Take 1 tablet (40 mg total) by mouth 2 (two) times daily. Patient not taking: Reported on 07/04/2016 10/25/15   Richarda Overlie, MD  LORazepam (ATIVAN) 2 MG/ML concentrated solution Take 0.5 mLs (1 mg total) by mouth every 4 (four) hours as needed for anxiety. Patient not taking: Reported on 07/04/2016 10/25/15   Richarda Overlie, MD  morphine (ROXANOL) 20 MG/ML concentrated solution Take 0.25 mLs (5 mg total) by mouth every 2 (two) hours  as needed for severe pain. Patient not taking: Reported on 07/04/2016 10/25/15   Richarda Overlie, MD  QUEtiapine (SEROQUEL) 50 MG tablet Take 1 tablet (50 mg total) by mouth at bedtime. 07/08/16   Donita Brooks, MD  tamsulosin (FLOMAX) 0.4 MG CAPS capsule Take 1 capsule (0.4 mg total) by mouth daily. Patient not taking: Reported on 07/04/2016 09/12/15   Rolland Porter, MD    Family History Family History  Problem Relation  Age of Onset  . Arthritis Mother   . Alcohol abuse Father   . Arthritis Father   . Diabetes Father   . Drug abuse Brother   . Learning disabilities Brother     Social History Social History  Substance Use Topics  . Smoking status: Former Smoker    Quit date: 07/23/1991  . Smokeless tobacco: Never Used  . Alcohol use No     Allergies   Sudafed [pseudoephedrine hcl]   Review of Systems Review of Systems  Constitutional: Negative for chills and fever.  HENT: Negative for sore throat.   Respiratory: Negative for cough, chest tightness and shortness of breath.   Cardiovascular: Negative for chest pain and palpitations.  Gastrointestinal: Negative for abdominal pain, constipation, diarrhea, nausea and vomiting.  Genitourinary: Negative for dysuria, frequency and hematuria.  Musculoskeletal: Negative for myalgias.  Skin: Negative for rash.  Neurological: Positive for speech difficulty. Negative for syncope, weakness, light-headedness, numbness and headaches.     Physical Exam Updated Vital Signs BP 122/90   Pulse 93   Temp (!) 97.5 F (36.4 C) (Oral)   Resp 17   SpO2 100%   Physical Exam  Constitutional: He is oriented to person, place, and time. He appears well-developed and well-nourished. No distress.  Morbidly obese   HENT:  Head: Normocephalic and atraumatic.  Nose: Nose normal.  Mouth/Throat: No oropharyngeal exudate.  Dry mucous membranes   Eyes: Conjunctivae are normal.  Neck: Normal range of motion. Neck supple.  Cardiovascular: Normal rate, regular rhythm, normal heart sounds and intact distal pulses.   No murmur heard. Pulmonary/Chest: Effort normal and breath sounds normal. No respiratory distress. He has no wheezes. He has no rales.  Abdominal: Soft. Bowel sounds are normal. He exhibits no distension. There is no tenderness.  Musculoskeletal: Normal range of motion. He exhibits no deformity.  Lymphadenopathy:    He has no cervical adenopathy.    Neurological: He is alert and oriented to person, place, and time.  Marked aphasia  A&O x3. Thought process coherent.   Strength 5/5 in upper and lower extremities, questionable 4/5 strength in RUE Sensation to light touch intact in UE and LE  Unable to assess gait, trunk instability or leg drift Intact finger to nose test. CN I not tested. CN II - XII intact bilaterally.  Skin: Skin is warm and dry. Capillary refill takes less than 2 seconds.  Psychiatric: He has a normal mood and affect. His behavior is normal. Judgment and thought content normal.  Nursing note and vitals reviewed.    ED Treatments / Results  Labs (all labs ordered are listed, but only abnormal results are displayed) Labs Reviewed  CBC - Abnormal; Notable for the following:       Result Value   WBC 10.6 (*)    All other components within normal limits  COMPREHENSIVE METABOLIC PANEL - Abnormal; Notable for the following:    Chloride 97 (*)    Glucose, Bld 114 (*)    BUN 26 (*)    ALT  8 (*)    All other components within normal limits  I-STAT CHEM 8, ED - Abnormal; Notable for the following:    Chloride 96 (*)    BUN 33 (*)    Glucose, Bld 114 (*)    Calcium, Ion 1.02 (*)    All other components within normal limits  URINE CULTURE  PROTIME-INR  APTT  DIFFERENTIAL  URINALYSIS, ROUTINE W REFLEX MICROSCOPIC  I-STAT TROPOININ, ED    EKG  EKG Interpretation  Date/Time:  Thursday January 30 2017 12:39:11 EDT Ventricular Rate:  119 PR Interval:    QRS Duration: 104 QT Interval:  392 QTC Calculation: 552 R Axis:   -58 Text Interpretation:  Atrial fibrillation LAD, consider left anterior fascicular block ST elevation, consider inferior injury Prolonged QT interval Confirmed by Rolland Porter ([id number]) on 01/30/2017 1:02:16 PM       Radiology Ct Head Wo Contrast  Result Date: 01/30/2017 CLINICAL DATA:  Episode of slurred speech today at 9 a.m. EXAM: CT HEAD WITHOUT CONTRAST TECHNIQUE: Contiguous axial  images were obtained from the base of the skull through the vertex without intravenous contrast. COMPARISON:  Head CT scan 10/21/2015 FINDINGS: Brain: The patient has a remote left PCA territory infarct which is new since the prior CT scan. There is cortical atrophy and chronic microvascular ischemic change. No evidence of acute infarct, hemorrhage, mass lesion, mass effect, midline shift or abnormal extra-axial fluid collection. No hydrocephalus or pneumocephalus. Vascular: Atherosclerosis noted. Skull: Intact. Sinuses/Orbits: Negative. Other: None. IMPRESSION: No acute abnormality. Remote left PCA territory infarct is new since the prior head CT. Atrophy and chronic microvascular ischemic change Atherosclerosis. Electronically Signed   By: Drusilla Kanner M.D.   On: 01/30/2017 13:30   Mr Brain Wo Contrast  Result Date: 01/30/2017 CLINICAL DATA:  Acute presentation with aphasia and right facial droop. EXAM: MRI HEAD WITHOUT CONTRAST TECHNIQUE: Multiplanar, multiecho pulse sequences of the brain and surrounding structures were obtained without intravenous contrast. COMPARISON:  CT same day. FINDINGS: Brain: No definite acute infarction. I question if there could be some minor acute or subacute watershed insults in the deep white matter on the left, but this is not certain. Elsewhere, the brainstem is normal. There is cerebellar atrophy without focal insult. Old small vessel infarction of the left thalamus. Cerebral hemispheres show chronic small-vessel ischemic changes throughout the hemispheric deep white matter of a mild degree with generalized cerebral atrophy. Old left occipital infarction. No evidence of mass lesion, hemorrhage or obstructive hydrocephalus. Ventricles are prominent, consistent with central atrophy. Vascular: Major vessels at the base of the brain show flow. Skull and upper cervical spine: Negative Sinuses/Orbits: Clear/normal Other: None significant IMPRESSION: Diffusion imaging suggests  that there could be minor recent infarction in the deep white matter of the left hemisphere consistent with a minor watershed insult. This is not definite however. Atrophy and chronic small vessel ischemic changes elsewhere throughout the brain. 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 Electronically Signed   By: Paulina Fusi M.D.   On: 01/30/2017 16:09    Procedures Procedures (including critical care time)  Medications Ordered in ED Medications  sodium chloride 0.9 % bolus 1,000 mL (1,000 mLs Intravenous New Bag/Given 01/30/17 1408)  sodium chloride 0.9 % bolus 1,000 mL (1,000 mLs Intravenous New Bag/Given 01/30/17 1410)     Initial Impression / Assessment and Plan / ED Course  I have reviewed the triage vital signs and the nursing notes.  Pertinent labs & imaging results that were available during my  care of the patient were reviewed by me and considered in my medical decision making (see chart for details).  Clinical Course as of Jan 31 1627  Thu Jan 30, 2017  1526 Failed swallow test   [CG]    Clinical Course User Index [CG] Liberty HandyGibbons, Yula Crotwell J, PA-C   76 year old male with history of CVA and medical noncompliance presents to the ED for evaluation of dysarthria and aphasia. Patient is a poor historian and guesses symptoms started at 11:30 last night, but also says "i'm guessing a time to make you happy". Son who lives with him and noticed slurred speech this morning, unknown exact time. Has condom cath, urine appears murky.   CT scan negative for acute abnormalities. CMP, CBC, trop, PT/INR, PTT all noncontributory. EKG shows atrial fibrillation, prolonged QT, possible old ST elevation inferiorly. Patient failed swallow evaluation done by RN. Speech evaluation ordered. Will consult neurohospitalist    Final Clinical Impressions(s) / ED Diagnoses   Final diagnoses:  Slurred speech   Dr. Wilford CornerArora (neurohospitalist) recommends MRI and admissiont. Discussed plan with patient and son at  bedside, they are agreeable. Daughter is POA and is on her way to ED. Dr. Wilford CornerArora aware of pending MRI. Will request admission for stroke work up, cardiology, PT/OT.  Patient, ED treatment and discharge plan was discussed with supervising physician who also evaluated the patient and is agreeable with plan.   New Prescriptions New Prescriptions   No medications on file      Jerrell MylarGibbons, Kenijah Benningfield J, PA-C 01/30/17 1628    Liberty HandyGibbons, Eliana Lueth J, PA-C 01/30/17 1643    Rolland PorterJames, Mark, MD 02/05/17 [phone]

## 2017-01-30 NOTE — Consult Note (Signed)
Cardiology Consult    Patient ID: Fran LowesJoel A Bullen MRN: [id number], DOB/AGE: 78/11/1940   Admit date: 01/30/2017 Date of Consult: 01/30/2017  Primary Physician: Donita BrooksPickard, Warren T, MD Primary Cardiologist: new - Dr. Elease HashimotoNahser Requesting Provider: Dr. Sharl MaLama  Reason for Consult: atrial fibrillation  Patient Profile    Mr. Alvester Morinewton has a PMH significant for atrial fibrillation, morbid obesity, OSA, HTN, medical noncompliance, and chronic systolic and diastolic heart failure. The patient is bed bound from a previous stroke and has a chronic foley in place. He was found with symptoms of stroke and was brought to [facility].;  Fran LowesJoel A Anger is a 76 y.o. male who is being seen today for the evaluation of atrial fibrillation at the request of Dr. Sharl MaLama.   Past Medical History   Past Medical History:  Diagnosis Date  . Allergy 1978   rag weed, dust, animal dander, pollen  . Arthritis   . Asthma   . Atrial fibrillation (HCC)   . BPH (benign prostatic hypertrophy)   . Chronic insomnia   . Gout   . Hyperlipidemia   . Hypertension   . Obesity 1974    History reviewed. No pertinent surgical history.   Allergies  Allergies  Allergen Reactions  . Sudafed [Pseudoephedrine Hcl] Other (See Comments)    Weird Dreams    History of Present Illness    Level 5 Caveat, history difficult to gather from patient in setting of stroke, daughter at bedside  Mr. Alvester Morinewton was found today by a home health care worker with symptoms of stroke. EMS was called and [facility]. Upon arrival, he was found to be in atrial fibrillation with ventricular rates in the 100-110s. He is asymptotic. He denies chest pain and palpitations.  Neurology at bedside.  Inpatient Medications       Outpatient Medications    Prior to Admission medications   Medication Sig Start Date End Date Taking? Authorizing Provider  acetaminophen (TYLENOL) 325 MG tablet Take 650 mg by mouth every 6 (six)  hours as needed for mild pain.   Yes [provider]  diphenhydrAMINE (BENADRYL) 25 MG tablet Take 25 mg by mouth daily as needed for allergies.   Yes [provider]  furosemide (LASIX) 40 MG tablet Take 40 mg by mouth daily.   Yes [provider]  QUEtiapine (SEROQUEL) 50 MG tablet Take 1 tablet (50 mg total) by mouth at bedtime. 07/08/16  Yes Donita BrooksPickard, Warren T, MD  albuterol (PROVENTIL HFA;VENTOLIN HFA) 108 (90 Base) MCG/ACT inhaler Inhale 1-2 puffs into the lungs every 6 (six) hours as needed for wheezing. Patient not taking: Reported on 07/04/2016 09/12/15   Rolland PorterJames, Mark, MD  diltiazem (CARDIZEM CD) 180 MG 24 hr capsule Take 1 capsule (180 mg total) by mouth daily. Patient not taking: Reported on 07/04/2016 09/12/15   Rolland PorterJames, Mark, MD  furosemide (LASIX) 40 MG tablet Take 1 tablet (40 mg total) by mouth 2 (two) times daily. Patient not taking: Reported on 07/04/2016 10/25/15   Richarda OverlieAbrol, Nayana, MD  LORazepam (ATIVAN) 2 MG/ML concentrated solution Take 0.5 mLs (1 mg total) by mouth every 4 (four) hours as needed for anxiety. Patient not taking: Reported on 07/04/2016 10/25/15   Richarda OverlieAbrol, Nayana, MD  morphine (ROXANOL) 20 MG/ML concentrated solution Take 0.25 mLs (5 mg total) by mouth every 2 (two) hours as needed for severe pain. Patient not taking: Reported on 07/04/2016 10/25/15   Richarda OverlieAbrol, Nayana, MD     Family  History     Family History  Problem Relation Age of Onset  . Arthritis Mother   . Alcohol abuse Father   . Arthritis Father   . Diabetes Father   . Drug abuse Brother   . Learning disabilities Brother     Social History    Social History   Social History  . Marital status: Single    Spouse name: N/A  . Number of children: N/A  . Years of education: N/A   Occupational History  . Not on file.   Social History Main Topics  . Smoking status: Former Smoker    Quit date: 07/23/1991  . Smokeless tobacco: Never Used  . Alcohol use No  . Drug use: No  .  Sexual activity: Not Currently   Other Topics Concern  . Not on file   Social History Narrative  . No narrative on file     Review of Systems    Level 5 caveat, stroke  Physical Exam    Blood pressure (!) 134/91, pulse (!) 103, temperature (!) 97.5 F (36.4 C), temperature source Oral, resp. rate 15, SpO2 99 %.  General: aggravated, Psych: Normal affect. HEENT: Normal  Neck: Supple without bruits, JVD difficult to assess. Lungs:  Resp regular and unlabored, CTA, diminished in bases Heart: Irregular rhythm, irregular rate,  No murmurs. Abdomen: Soft, non-tender, non-distended, BS + x 4.  Extremities: No clubbing, cyanosis, 2+edema with chronic venous stasis skin changes. DP/PT/Radials 1+ and equal bilaterally.  Labs    Troponin (Point of Care Test)  Recent Labs  01/30/17 1320  TROPIPOC 0.03   No results for input(s): CKTOTAL, CKMB, TROPONINI in the last 72 hours. Lab Results  Component Value Date   WBC 10.6 (H) 01/30/2017   HGB 15.3 01/30/2017   HCT 45.0 01/30/2017   MCV 92.2 01/30/2017   PLT 158 01/30/2017    Recent Labs Lab 01/30/17 1303 01/30/17 1321  NA 137 140  K 4.2 4.0  CL 97* 96*  CO2 29  --   BUN 26* 33*  CREATININE 1.12 1.10  CALCIUM 9.1  --   PROT 6.5  --   BILITOT 0.6  --   ALKPHOS 54  --   ALT 8*  --   AST 21  --   GLUCOSE 114* 114*   Lab Results  Component Value Date   CHOL 163 09/27/2013   HDL 45 09/27/2013   LDLCALC 85 09/27/2013   TRIG 165 (H) 09/27/2013   No results found for: [facility]   Radiology Studies    Ct Head Wo Contrast  Result Date: 01/30/2017 CLINICAL DATA:  Episode of slurred speech today at 9 a.m. EXAM: CT HEAD WITHOUT CONTRAST TECHNIQUE: Contiguous axial images were obtained from the base of the skull through the vertex without intravenous contrast. COMPARISON:  Head CT scan 10/21/2015 FINDINGS: Brain: The patient has a remote left PCA territory infarct which is new since the prior CT scan. There is cortical  atrophy and chronic microvascular ischemic change. No evidence of acute infarct, hemorrhage, mass lesion, mass effect, midline shift or abnormal extra-axial fluid collection. No hydrocephalus or pneumocephalus. Vascular: Atherosclerosis noted. Skull: Intact. Sinuses/Orbits: Negative. Other: None. IMPRESSION: No acute abnormality. Remote left PCA territory infarct is new since the prior head CT. Atrophy and chronic microvascular ischemic change Atherosclerosis. Electronically Signed   By: Drusilla Kanner M.D.   On: 01/30/2017 13:30   Mr Brain Wo Contrast  Result Date: 01/30/2017 CLINICAL DATA:  Acute presentation with  aphasia and right facial droop. EXAM: MRI HEAD WITHOUT CONTRAST TECHNIQUE: Multiplanar, multiecho pulse sequences of the brain and surrounding structures were obtained without intravenous contrast. COMPARISON:  CT same day. FINDINGS: Brain: No definite acute infarction. I question if there could be some minor acute or subacute watershed insults in the deep white matter on the left, but this is not certain. Elsewhere, the brainstem is normal. There is cerebellar atrophy without focal insult. Old small vessel infarction of the left thalamus. Cerebral hemispheres show chronic small-vessel ischemic changes throughout the hemispheric deep white matter of a mild degree with generalized cerebral atrophy. Old left occipital infarction. No evidence of mass lesion, hemorrhage or obstructive hydrocephalus. Ventricles are prominent, consistent with central atrophy. Vascular: Major vessels at the base of the brain show flow. Skull and upper cervical spine: Negative Sinuses/Orbits: Clear/normal Other: None significant IMPRESSION: Diffusion imaging suggests that there could be minor recent infarction in the deep white matter of the left hemisphere consistent with a minor watershed insult. This is not definite however. Atrophy and chronic small vessel ischemic changes elsewhere throughout the brain. Old left  occipital cortical infarction. Electronically Signed   By: Paulina Fusi M.D.   On: 01/30/2017 16:09    ECG & Cardiac Imaging    EKG 01/30/17: atrial fibrillation with ventricular rate 119, possible ST elevation in leads  Echocardiogram: pending  Echocardiogram 10/22/15: Study Conclusions - Left ventricle: The cavity size was normal. There was mild   concentric hypertrophy. Systolic function was mildly to   moderately reduced. The estimated ejection fraction was in the   range of 40% to 45%. Diffuse hypokinesis. The study was not   technically sufficient to allow evaluation of LV diastolic   dysfunction due to atrial fibrillation. - Aortic valve: Trileaflet; mildly thickened, mildly calcified   leaflets. - Aorta: Aortic root dimension: 38 mm (ED). - Aortic root: The aortic root was mildly dilated. - Mitral valve: There was mild to moderate regurgitation  Assessment & Plan    1. Atrial fibrillation - pt is noncompliant with medications - we will concentrate efforts for rate control - will start lopressor 25 mg BID - will start eliquis when cleared to do so by neurology for [facility] - recommend 5 mg bid This patients CHA2DS2-VASc Score and unadjusted Ischemic Stroke Rate (% per year) is equal to 9.7 % stroke rate/year from a score of 6 (CHF, HTN, age, stroke).   2. CVA - per neurology - we will not start anticoagulation until cleared to do so by neurology   3. Lower extremity swelling, chronic combined heart failure - his baseline is unclear - has a history of systolic and diastolic heart failure - repeat echo pending   Signed, Marcelino Duster, PA-C 01/30/2017, 5:51 PM [phone]  Attending Note:   The patient was seen and examined.  Agree with assessment and plan as noted above.  Changes made to the above note as needed.  Patient seen and independently examined with Bettina Gavia, PA .   We discussed all aspects of the encounter. I agree with the assessment and plan as  stated above.  1.   Atrial fib:   Has a CHADS2VASC score of 6 (CHF, HTN, age, stroke) He has been noncompliant in the past.    It seems like he may agree to taking Eliquis again - Ive discussed with him and his daughter that he is at risk for another stroke if he does not take an anticoagulant. Will start metoprolol 25 PO  BID for better rate control  Will continue with rate control and anticoagulation strategy. He is bed bound and I would make no effort to do a cardioversion Has had an echo in the past which showed mildly reduced EF of 40-45% .     I have spent a total of 40 minutes with patient reviewing hospital  notes , telemetry, EKGs, labs and examining patient as well as establishing an assessment and plan that was discussed with the patient. > 50% of time was spent in direct patient care.  He is quite aggitated.   Im not sure if he will listen to any of the advice that I have given him.  Will sign off. He should follow up with his medical doctor.   His medical doctor has recommended Eliquis in the past and the patient refused.  He is mildly tachycardic. Will start metoprolol to help slow his H R  We do not have anything further to offer.     Vesta Mixer, Montez Hageman., MD, [facility] 01/30/2017, 6:42 PM [address]  [address] Office [phone] Pager [phone]

## 2017-01-30 NOTE — ED Notes (Signed)
Pt's son Leonard Montoya, [phone].

## 2017-01-30 NOTE — Consult Note (Signed)
Requesting Physician: Dr. Fayrene Fearing    Chief Complaint: Stroke  History obtained from:  Patient     HPI:                                                                                                                                         Leonard Montoya is an 76 y.o. male who is a poor historian [name] is also very hard of hearing. Patient states that he feels a few days ago he noted a right facial droop and his speech was slurred. Over the last few days he states gone worse. Currently he has significant dysarthria, right facial droop, and is severely hard of hearing. Patient also has significant weakness in his lower extremities to which he says is chronic but is unable to state me whether he uses a wheelchair or a walker. I would imagine that he uses a walker at least as he cannot elevate bilateral legs off the bed.    Of note patient's urine is very cloudy and I question if he has a UTI.  Date last known well: Unable to determine Time last known well: Unable to determine tPA Given: No: recent stroke and no LSN   Past Medical History:  Diagnosis Date  . Allergy 1978   rag weed, dust, animal dander, pollen  . Arthritis   . Asthma   . Atrial fibrillation (HCC)   . BPH (benign prostatic hypertrophy)   . Chronic insomnia   . Gout   . Hyperlipidemia   . Hypertension   . Obesity 1974    History reviewed. No pertinent surgical history.  Family History  Problem Relation Age of Onset  . Arthritis Mother   . Alcohol abuse Father   . Arthritis Father   . Diabetes Father   . Drug abuse Brother   . Learning disabilities Brother    Social History:  reports that he quit smoking about 25 years ago. He has never used smokeless tobacco. He reports that he does not drink alcohol or use drugs.  Allergies:  Allergies  Allergen Reactions  . Sudafed [Pseudoephedrine Hcl]     Weird Dreams    Medications:                                                                                                                           No current facility-administered  medications for this encounter.    Current Outpatient Prescriptions  Medication Sig Dispense Refill  . albuterol (PROVENTIL HFA;VENTOLIN HFA) 108 (90 Base) MCG/ACT inhaler Inhale 1-2 puffs into the lungs every 6 (six) hours as needed for wheezing. (Patient not taking: Reported on 07/04/2016) 1 Inhaler 0  . diltiazem (CARDIZEM CD) 180 MG 24 hr capsule Take 1 capsule (180 mg total) by mouth daily. (Patient not taking: Reported on 07/04/2016) 30 capsule 0  . furosemide (LASIX) 40 MG tablet Take 1 tablet (40 mg total) by mouth 2 (two) times daily. (Patient not taking: Reported on 07/04/2016) 60 tablet 0  . LORazepam (ATIVAN) 2 MG/ML concentrated solution Take 0.5 mLs (1 mg total) by mouth every 4 (four) hours as needed for anxiety. (Patient not taking: Reported on 07/04/2016) 30 mL 0  . morphine (ROXANOL) 20 MG/ML concentrated solution Take 0.25 mLs (5 mg total) by mouth every 2 (two) hours as needed for severe pain. (Patient not taking: Reported on 07/04/2016) 30 mL 0  . QUEtiapine (SEROQUEL) 50 MG tablet Take 1 tablet (50 mg total) by mouth at bedtime. 180 tablet 3  . tamsulosin (FLOMAX) 0.4 MG CAPS capsule Take 1 capsule (0.4 mg total) by mouth daily. (Patient not taking: Reported on 07/04/2016) 30 capsule 0     ROS:                                                                                                                                       History obtained from the patient  General ROS: negative for - chills, fatigue, fever, night sweats, weight gain or weight loss Psychological ROS: negative for - behavioral disorder, hallucinations, memory difficulties, mood swings or suicidal ideation Ophthalmic ROS: negative for - blurry vision, double vision, eye pain or loss of vision ENT ROS: negative for - epistaxis, nasal discharge, oral lesions, sore throat, tinnitus or vertigo Allergy and Immunology ROS: negative for - hives or  itchy/watery eyes Hematological and Lymphatic ROS: negative for - bleeding problems, bruising or swollen lymph nodes Endocrine ROS: negative for - galactorrhea, hair pattern changes, polydipsia/polyuria or temperature intolerance Respiratory ROS: negative for - cough, hemoptysis, shortness of breath or wheezing Cardiovascular ROS: negative for - chest pain, dyspnea on exertion, edema or irregular heartbeat Gastrointestinal ROS: negative for - abdominal pain, diarrhea, hematemesis, nausea/vomiting or stool incontinence Genito-Urinary ROS: negative for - dysuria, hematuria, incontinence or urinary frequency/urgency Musculoskeletal ROS: negative for - joint swelling or muscular weakness Neurological ROS: as noted in HPI Dermatological ROS: negative for rash and skin lesion changes  Neurologic Examination:  Blood pressure (!) 134/57, pulse (!) 53, temperature (!) 97.5 F (36.4 C), temperature source Oral, resp. rate 15, SpO2 98 %.  HEENT-  Normocephalic, no lesions, without obvious abnormality.  Normal external eye and conjunctiva.  Normal TM's bilaterally.  Normal auditory canals and external ears. Normal external nose, mucus membranes and septum.  Normal pharynx. Cardiovascular- irregularly irregular rhythm, pulses palpable throughout   Lungs- chest clear, no wheezing, rales, normal symmetric air entry Abdomen- soft, non-tender; bowel sounds normal; no masses,  no organomegaly Extremities- no edema Lymph-no adenopathy palpable Musculoskeletal-no joint tenderness, deformity or swelling Skin-warm and dry, no hyperpigmentation, vitiligo, or suspicious lesions  Neurological Examination Mental Status: Alert, oriented, thought content appropriate.  Speech is severely dysarthric with mild expressive aphasia.  Able to follow 3 step commands without difficulty. Cranial Nerves: II:  Right hemianopsia   III,IV, VI: ptosis not present, extra-ocular motions intact bilaterally, pupils equal, round, reactive to light and accommodation V,VII: smile asymmetric on the right side, facial light touch sensation normal bilaterally VIII: hearing normal bilaterally IX,X: uvula rises symmetrically XI: bilateral shoulder shrug XII: midline tongue extension Motor: Right : Upper extremity   5/5    Left:     Upper extremity   5/5  Lower extremity   2/5     Lower extremity   2/5 Tone and bulk:normal tone throughout; no atrophy noted Sensory: Pinprick and light touch intact throughout, bilaterally Deep Tendon Reflexes: 1+ and symmetric throughout upper extremities with no knee jerk or ankle jerk Plantars: Mute bilaterally Cerebellar: normal finger-to-nose, Gait: Not tested    Lab Results: Basic Metabolic Panel:  Recent Labs Lab 01/30/17 1303 01/30/17 1321  NA 137 140  K 4.2 4.0  CL 97* 96*  CO2 29  --   GLUCOSE 114* 114*  BUN 26* 33*  CREATININE 1.12 1.10  CALCIUM 9.1  --     Liver Function Tests:  Recent Labs Lab 01/30/17 1303  AST 21  ALT 8*  ALKPHOS 54  BILITOT 0.6  PROT 6.5  ALBUMIN 3.5   No results for input(s): LIPASE, AMYLASE in the last 168 hours. No results for input(s): AMMONIA in the last 168 hours.  CBC:  Recent Labs Lab 01/30/17 1303 01/30/17 1321  WBC 10.6*  --   NEUTROABS 6.9  --   HGB 14.0 15.3  HCT 44.7 45.0  MCV 92.2  --   PLT 158  --     Cardiac Enzymes: No results for input(s): CKTOTAL, CKMB, CKMBINDEX, TROPONINI in the last 168 hours.  Lipid Panel: No results for input(s): CHOL, TRIG, HDL, CHOLHDL, VLDL, LDLCALC in the last 168 hours.  CBG: No results for input(s): GLUCAP in the last 168 hours.  Microbiology: Results for orders placed or performed during the hospital encounter of 10/20/15  MRSA PCR Screening     Status: None   Collection Time: 10/21/15  4:34 AM  Result Value Ref Range Status   MRSA by PCR NEGATIVE NEGATIVE Final     Comment:        The GeneXpert MRSA Assay (FDA approved for NASAL specimens only), is one component of a comprehensive MRSA colonization surveillance program. It is not intended to diagnose MRSA infection nor to guide or monitor treatment for MRSA infections.   Culture, Urine     Status: None   Collection Time: 10/21/15  7:59 AM  Result Value Ref Range Status   Specimen Description URINE, CLEAN CATCH  Final   Special Requests NONE  Final  Culture 50,000 COLONIES/mL PROTEUS MIRABILIS  Final   Report Status 10/23/2015 FINAL  Final   Organism ID, Bacteria PROTEUS MIRABILIS  Final      Susceptibility   Proteus mirabilis - MIC*    AMPICILLIN <=2 SENSITIVE Sensitive     CEFAZOLIN <=4 SENSITIVE Sensitive     CEFTRIAXONE <=1 SENSITIVE Sensitive     CIPROFLOXACIN <=0.25 SENSITIVE Sensitive     GENTAMICIN <=1 SENSITIVE Sensitive     IMIPENEM <=0.25 SENSITIVE Sensitive     NITROFURANTOIN 64 RESISTANT Resistant     TRIMETH/SULFA <=20 SENSITIVE Sensitive     AMPICILLIN/SULBACTAM <=2 SENSITIVE Sensitive     PIP/TAZO <=4 SENSITIVE Sensitive     * 50,000 COLONIES/mL PROTEUS MIRABILIS  Culture, blood (Routine X 2) w Reflex to ID Panel     Status: None   Collection Time: 10/21/15 12:00 PM  Result Value Ref Range Status   Specimen Description BLOOD RIGHT HAND  Final   Special Requests BOTTLES DRAWN AEROBIC ONLY 5CC  Final   Culture NO GROWTH 5 DAYS  Final   Report Status 10/26/2015 FINAL  Final  Culture, blood (Routine X 2) w Reflex to ID Panel     Status: None   Collection Time: 10/21/15 12:15 PM  Result Value Ref Range Status   Specimen Description BLOOD RIGHT HAND  Final   Special Requests IN PEDIATRIC BOTTLE 5CC  Final   Culture NO GROWTH 5 DAYS  Final   Report Status 10/26/2015 FINAL  Final    Coagulation Studies:  Recent Labs  01/30/17 1303  LABPROT 13.9  INR 1.06    Imaging: Ct Head Wo Contrast  Result Date: 01/30/2017 CLINICAL DATA:  Episode of slurred speech today at  9 a.m. EXAM: CT HEAD WITHOUT CONTRAST TECHNIQUE: Contiguous axial images were obtained from the base of the skull through the vertex without intravenous contrast. COMPARISON:  Head CT scan 10/21/2015 FINDINGS: Brain: The patient has a remote left PCA territory infarct which is new since the prior CT scan. There is cortical atrophy and chronic microvascular ischemic change. No evidence of acute infarct, hemorrhage, mass lesion, mass effect, midline shift or abnormal extra-axial fluid collection. No hydrocephalus or pneumocephalus. Vascular: Atherosclerosis noted. Skull: Intact. Sinuses/Orbits: Negative. Other: None. IMPRESSION: No acute abnormality. Remote left PCA territory infarct is new since the prior head CT. Atrophy and chronic microvascular ischemic change Atherosclerosis. Electronically Signed   By: Drusilla Kanner M.D.   On: 01/30/2017 13:30  Assessment and plan discussed with with attending physician and they are in agreement.    Felicie Morn PA-C [facility] [phone]  01/30/2017, 2:50 PM   Assessment: 76 y.o. 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 At this time patient will be admitted for stroke workup. He does have A. fib on telemetry. Patient will need to be evaluated by cardiology for anticoagulation and also physical therapy for fall risks.  Stroke Risk Factors - atrial fibrillation, hyperlipidemia and hypertension   1. HgbA1c, fasting lipid panel 2. MRI, MRA  of the brain without contrast 3. PT consult, OT consult, Speech consult--will need to be evaluated for fall risk 4. Echocardiogram 5. Carotid dopplers 6. Prophylactic therapy-Antiplatelet med: Aspirin - dose 325 mg daily passes swallow screen 7. Risk factor modification 8. Telemetry monitoring 9. Frequent neuro checks 10 NPO until passes stroke swallow screen 11 please page stroke NP  Or  PA  Or MD from 8am -4 pm  as this patient from this time will be  followed by the stroke.   You can look them up on [email or web address]  Password [email or web address]   Patient will need cardiology to evaluate for anticoagulation.   NEUROHOSPITALIST ADDENDUM Pt. Seen and examined with Felicie Morn, PA-C. H&P as documented above. Briefly Mr. Mccormac is a 76 year old man with a history of hypertension, hyperlipidemia and atrial fibrillation not on anticoagulation who presents to the emergency room for sudden onset of facial droop and slurred speech. He says the symptoms of pain going on now for a few days but over the last couple days or so his symptoms are being worse. He is extremely hard of hearing and very noncooperative for the history and examination. His daughter provided some of the history as well. He has significant weakness in both his lower extremities but that's chronic and uses wheelchair or walker. Denies any recent sickness as other than as described above.  Review of system negative other than documented above.  Vital signs blood pressure 134/57 heart rate 53-120, afebrile  Neurological exam Alert oriented 1. Speech is dysarthric. Some word finding difficulty. Able to name simple objects like pen, watch and a finger ring. Cranial nerve exam: Pupils equal, reactive to light, extraocular movements intact, visual field exam shows right hemianopsia, right facial droop, severe hearing loss on the right, moderate hearing loss on the left, uvula midline, shoulder shrug bilaterally symmetrical and tongue midline. Motor exam: 5/5 strength in the left upper extremity, 3/5 in the right upper extremity, 2/5 bilateral lower extremities. Normal tone and bulk. Intact sensation Unable to elicit any DTRs No dysmetria on finger-nose-finger  1a Level of Conscious.: 0 1b LOC Questions: 2 1c LOC Commands:0  2 Best Gaze: 0 3 Visual: 2 4 Facial Palsy:1  5a Motor Arm - left:0  5b Motor Arm - Right:0  6a Motor Leg - Left: 2 6b Motor Leg -  Right:2  7 Limb Ataxia: 0 8 Sensory: 0 9 Best Language: 1 10 Dysarthria: 1 11 Extinct. and Inatten.:0  TOTAL: 11  Labs as above  Imaging: Noncontrast CT of the head is unremarkable, shows old left PCA infarct. MRI of the brain concerning for subacute left ACA/MCA and left MCA/PCA watershed territory infarcts, also he demonstrates old infarcts.  Assessment  76 year old man with morbid obesity, hypertension, hyperlipidemia and atrial fibrillation not on anticoagulation presents for evaluation of slurred speech and word finding difficulty along with some right-sided weakness. 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 Patient will require lifelong anticoagulation. Stroke team to evaluate the patient in the morning and decide on anticoagulation. Given that the stroke is subacute and not large, I would not be opposed to starting anticoagulation right away, but it would be reasonable to wait 5 days before starting anticoagulation.  Milon Dikes, MD [facility] [phone]  If 7pm to 7am, please call on call as listed on AMION.

## 2017-01-30 NOTE — ED Notes (Signed)
Clean condom cath and bag placed on PT for collection of urine sample

## 2017-01-30 NOTE — Progress Notes (Signed)
Transportation here for patient. Patient off unit for CXR.

## 2017-01-30 NOTE — ED Triage Notes (Signed)
Per ems- pt comes from home, LSN yesterday by caregiver. Today around 9 am caregiver arrived to have patient with slurred speech. 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 Has urinary catheter in place.

## 2017-01-30 NOTE — ED Notes (Signed)
PT remains in MRI at this time.  

## 2017-01-30 NOTE — ED Notes (Signed)
Daughter arrives at bedside

## 2017-01-30 NOTE — H&P (Signed)
TRH H&P    Patient Demographics:    Leonard Montoya, is a 76 y.o. male  MRN: [id number]  DOB - Apr 15, 1941  Admit Date - 01/30/2017  Referring MD/NP/PA: Dr. Rolland Porter  Outpatient Primary MD for the patient is Donita Brooks, MD  Patient coming from: Home  Chief Complaint  Patient presents with  . Aphasia      HPI:    Leonard Montoya  is a 76 y.o. male, With history of asthma, atrial fibrillation not on anti-correlation, gout, hyperlipidemia, hypertension, obesity who came to hospital for evaluation of slurred speech since last night. Patient not sure about timing of slurred speech. He is a poor historian. Patient also says that he developed weakness of right side. He denies passing out, no chest pain or shortness of breath. No palpitations. No nausea vomiting or diarrhea. Complains of intermittent urinary symptoms. Denies dysuria at this time.  In the ED CT head was negative for stroke. Neurology was consulted, and recommended admission for stroke workup.    Review of systems:      All other systems reviewed and are negative.   With Past History of the following :    Past Medical History:  Diagnosis Date  . Allergy 1978   rag weed, dust, animal dander, pollen  . Arthritis   . Asthma   . Atrial fibrillation (HCC)   . BPH (benign prostatic hypertrophy)   . Chronic insomnia   . Gout   . Hyperlipidemia   . Hypertension   . Obesity 1974      History reviewed. No pertinent surgical history.    Social History:      Social History  Substance Use Topics  . Smoking status: Former Smoker    Quit date: 07/23/1991  . Smokeless tobacco: Never Used  . Alcohol use No       Family History :     Family History  Problem Relation Age of Onset  . Arthritis Mother   . Alcohol abuse Father   . Arthritis Father   . Diabetes Father   . Drug abuse Brother   . Learning disabilities Brother       Home Medications:   Prior to Admission medications   Medication Sig Start Date End Date Taking? Authorizing Provider  acetaminophen (TYLENOL) 325 MG tablet Take 650 mg by mouth every 6 (six) hours as needed for mild pain.   Yes [provider]  diphenhydrAMINE (BENADRYL) 25 MG tablet Take 25 mg by mouth daily as needed for allergies.   Yes [provider]  furosemide (LASIX) 40 MG tablet Take 40 mg by mouth daily.   Yes [provider]  QUEtiapine (SEROQUEL) 50 MG tablet Take 1 tablet (50 mg total) by mouth at bedtime. 07/08/16  Yes Donita Brooks, MD  albuterol (PROVENTIL HFA;VENTOLIN HFA) 108 (90 Base) MCG/ACT inhaler Inhale 1-2 puffs into the lungs every 6 (six) hours as needed for wheezing. Patient not taking: Reported on 07/04/2016 09/12/15   Rolland Porter, MD  diltiazem [facility] CD) 180 MG 24  hr capsule Take 1 capsule (180 mg total) by mouth daily. Patient not taking: Reported on 07/04/2016 09/12/15   Rolland PorterJames, Mark, MD  furosemide (LASIX) 40 MG tablet Take 1 tablet (40 mg total) by mouth 2 (two) times daily. Patient not taking: Reported on 07/04/2016 10/25/15   Richarda OverlieAbrol, Nayana, MD  LORazepam (ATIVAN) 2 MG/ML concentrated solution Take 0.5 mLs (1 mg total) by mouth every 4 (four) hours as needed for anxiety. Patient not taking: Reported on 07/04/2016 10/25/15   Richarda OverlieAbrol, Nayana, MD  morphine (ROXANOL) 20 MG/ML concentrated solution Take 0.25 mLs (5 mg total) by mouth every 2 (two) hours as needed for severe pain. Patient not taking: Reported on 07/04/2016 10/25/15   Richarda OverlieAbrol, Nayana, MD     Allergies:     Allergies  Allergen Reactions  . Sudafed [Pseudoephedrine Hcl] Other (See Comments)    Weird Dreams     Physical Exam:   Vitals  Blood pressure 125/72, pulse (!) 53, temperature (!) 97.5 F (36.4 C), temperature source Oral, resp. rate 17, SpO2 100 %.  1.  General: Appears in no acute distress  2. Psychiatric:  Intact judgement and  insight, awake alert,  oriented x 3.  3. Neurologic: Dysarthria, motor strength 2/5 in both right upper and lower extremity, 5/5 in both left upper and lower extremity. Sensations are intact bilaterally.  4. Eyes :  anicteric sclerae, moist conjunctivae with no lid lag. PERRLA.  5. ENMT:  Oropharynx clear with moist mucous membranes and good dentition  6. Neck:  supple, no cervical lymphadenopathy appriciated, No thyromegaly  7. Respiratory : Normal respiratory effort, good air movement bilaterally,clear to  auscultation bilaterally  8. Cardiovascular : RRR, no gallops, rubs or murmurs, bilateral 1+ pitting edema lower extremity  9. Gastrointestinal:  Positive bowel sounds, abdomen soft, non-tender to palpation,no hepatosplenomegaly, no rigidity or guarding       10. Skin:  No cyanosis, normal texture and turgor, no rash, lesions or ulcers      Data Review:    CBC  Recent Labs Lab 01/30/17 1303 01/30/17 1321  WBC 10.6*  --   HGB 14.0 15.3  HCT 44.7 45.0  PLT 158  --   MCV 92.2  --   MCH 28.9  --   MCHC 31.3  --   RDW 14.7  --   LYMPHSABS 2.6  --   MONOABS 0.9  --   EOSABS 0.3  --   BASOSABS 0.0  --    ------------------------------------------------------------------------------------------------------------------  Chemistries   Recent Labs Lab 01/30/17 1303 01/30/17 1321  NA 137 140  K 4.2 4.0  CL 97* 96*  CO2 29  --   GLUCOSE 114* 114*  BUN 26* 33*  CREATININE 1.12 1.10  CALCIUM 9.1  --   AST 21  --   ALT 8*  --   ALKPHOS 54  --   BILITOT 0.6  --    ------------------------------------------------------------------------------------------------------------------  ------------------------------------------------------------------------------------------------------------------ GFR: CrCl cannot be calculated (Unknown ideal weight.). Liver Function Tests:  Recent Labs Lab 01/30/17 1303  AST 21  ALT 8*  ALKPHOS 54  BILITOT 0.6  PROT 6.5  ALBUMIN 3.5   No  results for input(s): LIPASE, AMYLASE in the last 168 hours. No results for input(s): AMMONIA in the last 168 hours. Coagulation Profile:  Recent Labs Lab 01/30/17 1303  INR 1.06    ---------------------------------------------------------------------------------------------------------------      Imaging Results:    Ct Head Wo Contrast  Result Date: 01/30/2017 CLINICAL DATA:  Episode of  slurred speech today at 9 a.m. EXAM: CT HEAD WITHOUT CONTRAST TECHNIQUE: Contiguous axial images were obtained from the base of the skull through the vertex without intravenous contrast. COMPARISON:  Head CT scan 10/21/2015 FINDINGS: Brain: The patient has a remote left PCA territory infarct which is new since the prior CT scan. There is cortical atrophy and chronic microvascular ischemic change. No evidence of acute infarct, hemorrhage, mass lesion, mass effect, midline shift or abnormal extra-axial fluid collection. No hydrocephalus or pneumocephalus. Vascular: Atherosclerosis noted. Skull: Intact. Sinuses/Orbits: Negative. Other: None. IMPRESSION: No acute abnormality. Remote left PCA territory infarct is new since the prior head CT. Atrophy and chronic microvascular ischemic change Atherosclerosis. Electronically Signed   By: Drusilla Kanner M.D.   On: 01/30/2017 13:30   Mr Brain Wo Contrast  Result Date: 01/30/2017 CLINICAL DATA:  Acute presentation with aphasia and right facial droop. EXAM: MRI HEAD WITHOUT CONTRAST TECHNIQUE: Multiplanar, multiecho pulse sequences of the brain and surrounding structures were obtained without intravenous contrast. COMPARISON:  CT same day. FINDINGS: Brain: No definite acute infarction. I question if there could be some minor acute or subacute watershed insults in the deep white matter on the left, but this is not certain. Elsewhere, the brainstem is normal. There is cerebellar atrophy without focal insult. Old small vessel infarction of the left thalamus. Cerebral  hemispheres show chronic small-vessel ischemic changes throughout the hemispheric deep white matter of a mild degree with generalized cerebral atrophy. Old left occipital infarction. No evidence of mass lesion, hemorrhage or obstructive hydrocephalus. Ventricles are prominent, consistent with central atrophy. Vascular: Major vessels at the base of the brain show flow. Skull and upper cervical spine: Negative Sinuses/Orbits: Clear/normal Other: None significant IMPRESSION: Diffusion imaging suggests that there could be minor recent infarction in the deep white matter of the left hemisphere consistent with a minor watershed insult. This is not definite however. Atrophy and chronic small vessel ischemic changes elsewhere throughout the brain. 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 Electronically Signed   By: Paulina Fusi M.D.   On: 01/30/2017 16:09    My personal review of EKG: Rhythm - atrial fibrillation   Assessment & Plan:    Active Problems:   Right sided weakness    Atrial fibrillation   Hypertension    1. Right-sided weakness-we'll place under observation, initiate stroke workup. CT head is negative for stroke. Check MRI/MRA brain, carotid Dopplers, echocardiogram, fasting lipid profile. Will hold Cardizem and Lasix for permissive hypertension. 2. Atrial fibrillation- CHA2DS2VASc score is 6, patient is not on anti-coagulation. [facility] consult cardiology for further recommendations. Holding Cardizem for permissive hypertension. Monitor on telemetry. 3. Hypertension- will hold Cardizem for permissive hypertension. 4. Asthma- stable, continue when necessary albuterol.   DVT Prophylaxis-   Lovenox   AM Labs Ordered, also please review Full Orders  Family Communication: No family at bedside  Code Status:  DO NOT RESUSCITATE  Admission status: Observation    Time spent in minutes : 60 minutes   LAMA,GAGAN S M.D on 01/30/2017 at 5:40 PM  Between 7am to 7pm - Pager - [phone]. After  7pm go to [email or web address] - password [facility], [facility], [facility]  [facility] - Office  [phone]

## 2017-01-30 NOTE — ED Notes (Signed)
Attempted report, assignment to floor nurse has not been made for night shift. They will call back in 15 for report

## 2017-01-30 NOTE — ED Notes (Signed)
[facility] RN

## 2017-01-31 ENCOUNTER — Observation Stay (HOSPITAL_COMMUNITY): Payer: Medicare HMO

## 2017-01-31 DIAGNOSIS — Z7401 Bed confinement status: Secondary | ICD-10-CM | POA: Diagnosis not present

## 2017-01-31 DIAGNOSIS — I4891 Unspecified atrial fibrillation: Secondary | ICD-10-CM | POA: Diagnosis present

## 2017-01-31 DIAGNOSIS — Z87891 Personal history of nicotine dependence: Secondary | ICD-10-CM | POA: Diagnosis not present

## 2017-01-31 DIAGNOSIS — I110 Hypertensive heart disease with heart failure: Secondary | ICD-10-CM | POA: Diagnosis present

## 2017-01-31 DIAGNOSIS — I63532 Cerebral infarction due to unspecified occlusion or stenosis of left posterior cerebral artery: Secondary | ICD-10-CM | POA: Diagnosis present

## 2017-01-31 DIAGNOSIS — E785 Hyperlipidemia, unspecified: Secondary | ICD-10-CM | POA: Diagnosis present

## 2017-01-31 DIAGNOSIS — I5042 Chronic combined systolic (congestive) and diastolic (congestive) heart failure: Secondary | ICD-10-CM | POA: Diagnosis present

## 2017-01-31 DIAGNOSIS — I361 Nonrheumatic tricuspid (valve) insufficiency: Secondary | ICD-10-CM | POA: Diagnosis not present

## 2017-01-31 DIAGNOSIS — Z6839 Body mass index (BMI) 39.0-39.9, adult: Secondary | ICD-10-CM | POA: Diagnosis not present

## 2017-01-31 DIAGNOSIS — Z833 Family history of diabetes mellitus: Secondary | ICD-10-CM | POA: Diagnosis not present

## 2017-01-31 DIAGNOSIS — I6300 Cerebral infarction due to thrombosis of unspecified precerebral artery: Secondary | ICD-10-CM | POA: Diagnosis not present

## 2017-01-31 DIAGNOSIS — T83511A Infection and inflammatory reaction due to indwelling urethral catheter, initial encounter: Secondary | ICD-10-CM | POA: Diagnosis present

## 2017-01-31 DIAGNOSIS — I480 Paroxysmal atrial fibrillation: Secondary | ICD-10-CM | POA: Diagnosis not present

## 2017-01-31 DIAGNOSIS — I639 Cerebral infarction, unspecified: Secondary | ICD-10-CM

## 2017-01-31 DIAGNOSIS — I481 Persistent atrial fibrillation: Secondary | ICD-10-CM

## 2017-01-31 DIAGNOSIS — G4733 Obstructive sleep apnea (adult) (pediatric): Secondary | ICD-10-CM | POA: Diagnosis present

## 2017-01-31 DIAGNOSIS — R29707 NIHSS score 7: Secondary | ICD-10-CM | POA: Diagnosis present

## 2017-01-31 DIAGNOSIS — N3000 Acute cystitis without hematuria: Secondary | ICD-10-CM

## 2017-01-31 DIAGNOSIS — Z66 Do not resuscitate: Secondary | ICD-10-CM | POA: Diagnosis present

## 2017-01-31 DIAGNOSIS — R4701 Aphasia: Secondary | ICD-10-CM | POA: Diagnosis present

## 2017-01-31 DIAGNOSIS — Z811 Family history of alcohol abuse and dependence: Secondary | ICD-10-CM | POA: Diagnosis not present

## 2017-01-31 DIAGNOSIS — R471 Dysarthria and anarthria: Secondary | ICD-10-CM | POA: Diagnosis present

## 2017-01-31 DIAGNOSIS — G8191 Hemiplegia, unspecified affecting right dominant side: Secondary | ICD-10-CM | POA: Diagnosis present

## 2017-01-31 DIAGNOSIS — R29810 Facial weakness: Secondary | ICD-10-CM | POA: Diagnosis present

## 2017-01-31 DIAGNOSIS — Z9119 Patient's noncompliance with other medical treatment and regimen: Secondary | ICD-10-CM | POA: Diagnosis not present

## 2017-01-31 DIAGNOSIS — R29712 NIHSS score 12: Secondary | ICD-10-CM | POA: Diagnosis not present

## 2017-01-31 DIAGNOSIS — R4781 Slurred speech: Secondary | ICD-10-CM | POA: Diagnosis present

## 2017-01-31 DIAGNOSIS — Y846 Urinary catheterization as the cause of abnormal reaction of the patient, or of later complication, without mention of misadventure at the time of the procedure: Secondary | ICD-10-CM | POA: Diagnosis present

## 2017-01-31 DIAGNOSIS — E119 Type 2 diabetes mellitus without complications: Secondary | ICD-10-CM | POA: Diagnosis present

## 2017-01-31 DIAGNOSIS — I69339 Monoplegia of upper limb following cerebral infarction affecting unspecified side: Secondary | ICD-10-CM | POA: Diagnosis not present

## 2017-01-31 DIAGNOSIS — N390 Urinary tract infection, site not specified: Secondary | ICD-10-CM | POA: Diagnosis present

## 2017-01-31 DIAGNOSIS — I63312 Cerebral infarction due to thrombosis of left middle cerebral artery: Secondary | ICD-10-CM | POA: Diagnosis not present

## 2017-01-31 DIAGNOSIS — I340 Nonrheumatic mitral (valve) insufficiency: Secondary | ICD-10-CM | POA: Diagnosis not present

## 2017-01-31 DIAGNOSIS — I10 Essential (primary) hypertension: Secondary | ICD-10-CM | POA: Diagnosis not present

## 2017-01-31 LAB — URINALYSIS, ROUTINE W REFLEX MICROSCOPIC
Bilirubin Urine: NEGATIVE
Glucose, UA: NEGATIVE mg/dL
Ketones, ur: NEGATIVE mg/dL
Nitrite: POSITIVE — AB
Protein, ur: >300 mg/dL — AB
Specific Gravity, Urine: 1.02 (ref 1.005–1.030)
pH: 6.5 (ref 5.0–8.0)

## 2017-01-31 LAB — URINALYSIS, MICROSCOPIC (REFLEX)

## 2017-01-31 LAB — GLUCOSE, CAPILLARY: GLUCOSE-CAPILLARY: 103 mg/dL — AB (ref 65–99)

## 2017-01-31 MED ORDER — LEVOFLOXACIN 500 MG PO TABS
500.0000 mg | ORAL_TABLET | Freq: Every day | ORAL | Status: DC
  Filled 2017-01-31: qty 1

## 2017-01-31 MED ORDER — ATORVASTATIN CALCIUM 40 MG PO TABS
40.0000 mg | ORAL_TABLET | Freq: Every day | ORAL | Status: DC
  Administered 2017-01-31 – 2017-02-01 (×2): 40 mg via ORAL
  Filled 2017-01-31 (×2): qty 1

## 2017-01-31 MED ORDER — CEFTRIAXONE SODIUM 1 G IJ SOLR
1.0000 g | Freq: Once | INTRAMUSCULAR | Status: DC
  Filled 2017-01-31: qty 10

## 2017-01-31 MED ORDER — LEVOFLOXACIN 500 MG PO TABS
500.0000 mg | ORAL_TABLET | Freq: Every day | ORAL | Status: DC
  Administered 2017-01-31 – 2017-02-02 (×3): 500 mg via ORAL
  Filled 2017-01-31 (×3): qty 1

## 2017-01-31 NOTE — Evaluation (Signed)
Physical Therapy Evaluation Patient Details Name: Leonard LowesJoel A Fangman MRN: [id number] DOB: 08/11/1940 Today's Date: 01/31/2017   History of Present Illness  76 yo male pmh significant for afib, gout, HTN, and asthma, [facility] right facial droop, right sided weakness and aphasia.  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  Clinical Impression  Pt admitted with/for more infarcts.  Pt is significantly debilitated needing max assist for basic bed mobility and inability to stand or transfer without mechanical assist.  Pt states that he will go home with assist of his paid outside caregiver and will have physical therapy at home.  Pt currently limited functionally due to the problems listed. ( See problems list.)   Pt will benefit from PT to maximize function and safety in order to get ready for next venue listed below.     Follow Up Recommendations SNF;Other (comment) (but pt will likely refuse in favor of d/c home with caregive)    Equipment Recommendations  Other (comment) (TBA)    Recommendations for Other Services       Precautions / Restrictions Precautions Precautions: Fall      Mobility  Bed Mobility Overal bed mobility: Needs Assistance Bed Mobility: Supine to Sit;Sit to Supine     Supine to sit: Mod assist;HOB elevated Sit to supine: Mod assist;HOB elevated   General bed mobility comments: Pt requires mod assist for legs in and out of bed. Pt also +2 for scoot up in bed  Transfers   Equipment used: Rolling walker (2 wheeled);2 person hand held assist             General transfer comment: Pt unable to transfer at this time  Ambulation/Gait             General Gait Details: not applicable  Stairs            Wheelchair Mobility    Modified Rankin (Stroke Patients Only) Modified Rankin (Stroke Patients Only) Pre-Morbid Rankin Score: Severe disability Modified Rankin: Severe  disability     Balance Overall balance assessment: Needs assistance Sitting-balance support: Single extremity supported;Feet supported Sitting balance-Leahy Scale: Poor Sitting balance - Comments: requires UE support to maintain sitting EOB       Standing balance comment: attempted 1 trial of sit to stand into RW, but the pt declined further after initial attempt.                             Pertinent Vitals/Pain Pain Assessment: Faces Faces Pain Scale: Hurts little more Pain Location: Legs with movement Pain Descriptors / Indicators: Discomfort Pain Intervention(s): Limited activity within patient's tolerance    Home Living Family/patient expects to be discharged to:: Private residence Living Arrangements: Other (Comment);Alone Available Help at Discharge: Home health Type of Home: House Home Access: Level entry     Home Layout: One level   Additional Comments: has been homebound for the past year    Prior Function Level of Independence: Needs assistance   Gait / Transfers Assistance Needed: Pt has been bedbound for the past year  ADL's / Homemaking Assistance Needed: Pt had help with all ADL, and HH was doing meal prep        Hand Dominance   Dominant Hand: Right    Extremity/Trunk Assessment   Upper Extremity Assessment Upper Extremity Assessment: Defer to OT evaluation    Lower Extremity Assessment Lower  Extremity Assessment: Generalized weakness    Cervical / Trunk Assessment Cervical / Trunk Assessment: Kyphotic  Communication   Communication: HOH;Expressive difficulties  Cognition Arousal/Alertness: Awake/alert Behavior During Therapy: WFL for tasks assessed/performed Overall Cognitive Status: History of cognitive impairments - at baseline Area of Impairment: Attention;Following commands;Safety/judgement;Problem solving;Orientation                 Orientation Level: Disoriented to;Place;Time Current Attention Level:  Sustained   Following Commands: Follows one step commands with increased time;Follows one step commands inconsistently Safety/Judgement: Decreased awareness of safety   Problem Solving: Slow processing;Difficulty sequencing;Requires verbal cues;Requires tactile cues        General Comments General comments (skin integrity, edema, etc.): family showed up at the end of the treatment and related that pt was essentially bed bound for the last month.  Dtr states he usually declines therapy services.    Exercises     Assessment/Plan    PT Assessment Patient needs continued PT services  PT Problem List Decreased strength;Decreased activity tolerance;Decreased balance;Decreased mobility;Decreased coordination;Obesity       PT Treatment Interventions DME instruction;Functional mobility training;Therapeutic activities;Therapeutic exercise;Balance training;Patient/family education    PT Goals (Current goals can be found in the Care Plan section)  Acute Rehab PT Goals Patient Stated Goal: to get out of here PT Goal Formulation: With patient Time For Goal Achievement: 02/14/17 Potential to Achieve Goals: Fair    Frequency Min 3X/week   Barriers to discharge        Co-evaluation               AM-PAC PT "6 Clicks" Daily Activity  Outcome Measure Difficulty turning over in bed (including adjusting bedclothes, sheets and blankets)?: Total Difficulty moving from lying on back to sitting on the side of the bed? : Total Difficulty sitting down on and standing up from a chair with arms (e.g., wheelchair, bedside commode, etc,.)?: Total Help needed moving to and from a bed to chair (including a wheelchair)?: Total Help needed walking in hospital room?: Total Help needed climbing 3-5 steps with a railing? 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    Time: 1610-9604 PT Time Calculation (min) (ACUTE ONLY): 39 min   Charges:   PT Evaluation $PT Eval Moderate Complexity: 1 Procedure PT Treatments $Therapeutic Activity: 23-37 mins   PT G Codes:   PT G-Codes **NOT FOR INPATIENT CLASS** Functional Assessment Tool Used: AM-PAC 6 Clicks Basic Mobility;Clinical judgement Functional Limitation: Mobility: Walking and moving around Mobility: Walking and Moving Around Current Status (V4098): At least 60 percent but less than 80 percent impaired, limited or restricted Mobility: Walking and Moving Around Goal Status [phone]): At least 40 percent but less than 60 percent impaired, limited or restricted    01/31/2017   Bing, PT [phone]  (pager)  Eliseo Gum Jasiyah Poland 01/31/2017, 5:22 PM

## 2017-01-31 NOTE — Plan of Care (Signed)
Problem: Education: Goal: Knowledge of disease or condition will improve Outcome: Not Progressing Patient verbally combative with staff, very agitated. Stroke education provided upon arrival to unit. Patient demonstrating expressive and receptive aphasia. RN will continue to monitor.  Problem: Self-Care: Goal: Ability to communicate needs accurately will improve Outcome: Progressing Patient cognitive ability questionable. Patient demonstrating expressive and receptive aphasia and is extremely HOH, no hearing aids at bedside. Patient gets very agitated when attempting to communicate and unable to find appropriate words. RN attempted to provide reassurance, stroke education and connection with patient inability to verbalize needs at this time but patient very resistant to care. RN will continue to monitor patient.  Problem: Nutrition: Goal: Risk of aspiration will decrease Outcome: Progressing Patient kept NPO due to failed stoke swallow screen in ED. SLP eval ordered. RN will continue to monitor.

## 2017-01-31 NOTE — Progress Notes (Signed)
Cardiology spoke to patient about Eliquis. CM did benefits check for Eliquis in case patient is d/ced on the medication:   S/W Leonard Montoya  @ [place] # [phone]    ELIQUIS  5 MG BID   COVER- YES  CO-PAY- $242.00 DEDUCTIBLE NOT MET  TIER- 3 DRUG  PRIOR APPROVAL- NO

## 2017-01-31 NOTE — Care Management Note (Signed)
Case Management Note  Patient Details  Name: Leonard Montoya MRN: [id number] Date of Birth: 10/09/1940  Subjective/Objective:   Pt in with rt sided weakness. Pt is from home alone. He states he has an aide 21 hours out of the week. He states he has hospital bed, 3 in 1 and wheelchair. Pt did answer some of the questions inappropriately during the conversation but he also is very HOH.                  Action/Plan: OT recommending SNF. Awaiting PT recs. CM following for d/c disposition.  Expected Discharge Date:                  Expected Discharge Plan:  Skilled Nursing Facility  In-House Referral:     Discharge planning Services  CM Consult  Post Acute Care Choice:    Choice offered to:     DME Arranged:    DME Agency:     HH Arranged:    HH Agency:     Status of Service:  In process, will continue to follow  If discussed at Long Length of Stay Meetings, dates discussed:    Additional Comments:  Kermit BaloKelli F Dashawn Bartnick, RN 01/31/2017, 4:45 PM

## 2017-01-31 NOTE — Progress Notes (Signed)
Patient with increased slurred speech, facial droop, patient not responding to questions appropriately, aphasic. This is different than assessment done 5 minutes prior. NIHHS increased from 9 to 12. Patient was in bed sitting up, HOB lowered. VSS, see assessment, CBG 103. Dr. Elvera LennoxGherghe at bedside and notified. Stroke PA was notified of speech change from this AM.  Patient refusing IV access, educated, still refuses.

## 2017-01-31 NOTE — Evaluation (Addendum)
Clinical/Bedside Swallow Evaluation Patient Details  Name: Leonard Montoya MRN: [id number] Date of Birth: 03/24/1941  Today's Date: 01/31/2017 Time: SLP Start Time (ACUTE ONLY): [id number] SLP Stop Time (ACUTE ONLY): 0842 SLP Time Calculation (min) (ACUTE ONLY): 14 min  Past Medical History:  Past Medical History:  Diagnosis Date  . Allergy 1978   rag weed, dust, animal dander, pollen  . Arthritis   . Asthma   . Atrial fibrillation (HCC)   . BPH (benign prostatic hypertrophy)   . Chronic insomnia   . Gout   . Hyperlipidemia   . Hypertension   . Obesity 1974   Past Surgical History: History reviewed. No pertinent surgical history. HPI:  76 [facility] droop and aphasia.  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  Pt CXR showed right pleural effusion with right base atelectasis ? pna.  Speech/swallow eval ordered.   Assessment / Plan / Recommendation Clinical Impression  Pt presents with functional oropharyngeal swallow based on clinical evaluation.  No indication of aspiration with minimal po pt would accept.  Pt self fed applesauce, crackers and juices.  Mastication adequate without oral residuals.  Pt did not participate in oral motor exam - but able to seal lips on cup/straw.  Can not rule out silent aspiration as pt with possible right base pna per imaging and pt with CVA.   Pt is declining medical care per RN and speech/language evaluation with this SLP.  Agreeable only to eat a few bites/sips and states "I will get out of here".    Will follow up for skilled observation to assure po tolerance and/or indication for instrumental evaluation.  Suspect MBS (if indicated and pt would participate) may be able to be completed xray room that will accommodate larger patients.    Deferred SLE due to pt's lack of participation/agitation.  Will follow up for language evaluation and dysphagia management.     SLP Visit Diagnosis: Dysphagia, oropharyngeal phase (R13.12)    Aspiration Risk  Mild aspiration risk    Diet Recommendation Regular;Thin liquid   Liquid Administration via: Cup;Straw Medication Administration: Whole meds with liquid Supervision: Patient able to self feed Compensations: Slow rate;Small sips/bites    Other  Recommendations Oral Care Recommendations: Oral care BID   Follow up Recommendations        Frequency and Duration min 1 x/week  1 week       Prognosis Prognosis for Safe Diet Advancement: Guarded Barriers to Reach Goals: Cognitive deficits;Motivation;Severity of deficits;Behavior      Swallow Study   General Date of Onset: 01/31/17 HPI: 76 [facility] and aphasia.  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  Pt CXR showed right pleural effusion with right base atelectasis ? pna.  Speech/swallow eval ordered. Type of Study: Bedside Swallow Evaluation Diet Prior to this Study: NPO Temperature Spikes Noted: No Respiratory Status: Room air History of Recent Intubation: No Behavior/Cognition: Alert;Cooperative;Pleasant mood Oral Cavity Assessment: Within Functional Limits Oral Care Completed by SLP: No Oral Cavity - Dentition: Adequate natural dentition Vision: Functional for self-feeding Self-Feeding Abilities: Able to feed self;Needs set up Patient Positioning: Upright in bed Baseline Vocal Quality: Normal Volitional Cough: Cognitively unable to elicit Volitional Swallow: Unable to elicit    Oral/Motor/Sensory Function Overall Oral Motor/Sensory Function: Mild impairment (? gustatory changes due to  facial nerve impact) Facial ROM: Reduced right Facial Symmetry: Abnormal symmetry right Facial Strength: Reduced right Velum: Within Functional Limits   Ice Chips Ice chips: Within functional limits Presentation: Spoon   Thin Liquid Thin Liquid:  Within functional limits Presentation: Self Fed;Cup;Straw    Nectar Thick Nectar Thick Liquid: Not tested   Honey Thick Honey Thick Liquid: Not tested   Puree Puree: Within functional limits Presentation: Spoon;Self Fed   Solid   GO   Solid: Impaired Oral Phase Impairments: Reduced labial seal Oral Phase Functional Implications: Impaired mastication Pharyngeal Phase Impairments: Suspected delayed Swallow    Functional Assessment Tool Used: clinical judgement Functional Limitations: Swallowing Swallow Current Status (Z6109): At least 1 percent but less than 20 percent impaired, limited or restricted Swallow Goal Status [phone]): At least 1 percent but less than 20 percent impaired, limited or restricted   Donavan Burnet, MS [facility] SLP [phone]

## 2017-01-31 NOTE — Progress Notes (Signed)
PROGRESS NOTE  Leonard Montoya ZOX:[id number] DOB: February 10, 1941 DOA: 01/30/2017 PCP: Donita Brooks, MD   LOS: 0 days   Brief Narrative / Interim history: 76 y.o. male, With history of asthma, atrial fibrillation not on anti-coagulation, gout, hyperlipidemia, hypertension, obesity who came to hospital for evaluation of slurred speech since last night. Has a long standing history of non compliance and has been refusing anticoagulation in the past.   Assessment & Plan: Active Problems:   Essential hypertension, benign   Atrial fibrillation (HCC)   Morbid obesity (HCC)   Right sided weakness   UTI (urinary tract infection)   CVA (cerebral vascular accident) [facility])   CVA / slurred speech -Neurology consulted, appreciate input.  Patient with known A. fib, refusing to take anticoagulation. -Is refusing to have IV access, and further care. -MRI pending, however doubt this would be able to be performed.  Patient does not appear to have capacity in my evaluation, however he is uncooperative with all aspects of medical care.  Atrial fibrillation with intermittent RVR -Started on metoprolol 25 mg twice daily, continue.  Rate in the 120s-140s prior to metoprolol -High risk for stroke, refusing anticoagulation  UTI -Can explain some of the issues of #1, refusing IV placement, he refused IM antibiotics, will place on p.o. Levaquin but she is willing to take. -Urine cultures are pending  Chronic combined systolic and diastolic CHF -Currently appears euvolemic  Hypertension -Monitor  Goals of care -called daughter, left message.  Goals of care will need to be addressed  as has proven repeatedly in the past that he is resistant to significant number of aspects of his care, and may be better off to have hospice set up at home and avoid future hospitalizations.   DVT prophylaxis: Lovenox Code Status: DNR Family Communication: called daughter, left message Disposition Plan: home when  ready  Consultants:   Neurology   Procedures:   None   Antimicrobials:  Levaquin 7/13 >>   Subjective: - no chest pain, shortness of breath, no abdominal pain, nausea or vomiting. Angry with me for asking questions.   Objective: Vitals:   01/31/17 0600 01/31/17 0700 01/31/17 0814 01/31/17 1109  BP: (!) 142/88  133/90 (!) 155/93  Pulse: 70 (!) 53 (!) 59   Resp:   16   Temp: 98.1 F (36.7 C)     TempSrc: Oral     SpO2: 98%  97%   Weight:      Height:       No intake or output data in the 24 hours ending 01/31/17 1216 Filed Weights   01/30/17 2037  Weight: (!) 138.2 kg (304 lb 9.6 oz)    Examination:  Vitals:   01/31/17 0600 01/31/17 0700 01/31/17 0814 01/31/17 1109  BP: (!) 142/88  133/90 (!) 155/93  Pulse: 70 (!) 53 (!) 59   Resp:   16   Temp: 98.1 F (36.7 C)     TempSrc: Oral     SpO2: 98%  97%   Weight:      Height:        Constitutional: NAD Eyes: lids and conjunctivae normal ENMT: Mucous membranes are moist. Respiratory: clear to auscultation bilaterally, no wheezing, no crackles. Normal respiratory effort. No accessory muscle use.   Cardiovascular: irregular, no murmurs. No edema  Abdomen: no tenderness. Bowel sounds positive.  Skin: no rashes, lesions, ulcers. No induration Neurologic: non focal, not giving full effort. Slurred speech  Data Reviewed: I have independently reviewed following labs  and imaging studies   CBC:  Recent Labs Lab 01/30/17 1303 01/30/17 1321  WBC 10.6*  --   NEUTROABS 6.9  --   HGB 14.0 15.3  HCT 44.7 45.0  MCV 92.2  --   PLT 158  --    Basic Metabolic Panel:  Recent Labs Lab 01/30/17 1303 01/30/17 1321  NA 137 140  K 4.2 4.0  CL 97* 96*  CO2 29  --   GLUCOSE 114* 114*  BUN 26* 33*  CREATININE 1.12 1.10  CALCIUM 9.1  --    GFR: Estimated Creatinine Clearance: 83.2 mL/min (by C-G formula based on SCr of 1.1 mg/dL). Liver Function Tests:  Recent Labs Lab 01/30/17 1303  AST 21  ALT 8*   ALKPHOS 54  BILITOT 0.6  PROT 6.5  ALBUMIN 3.5   No results for input(s): LIPASE, AMYLASE in the last 168 hours. No results for input(s): AMMONIA in the last 168 hours. Coagulation Profile:  Recent Labs Lab 01/30/17 1303  INR 1.06   Cardiac Enzymes: No results for input(s): CKTOTAL, CKMB, CKMBINDEX, TROPONINI in the last 168 hours. BNP (last 3 results) No results for input(s): PROBNP in the last 8760 hours. HbA1C: No results for input(s): HGBA1C in the last 72 hours. CBG:  Recent Labs Lab 01/31/17 0813  GLUCAP 103*   Lipid Profile: No results for input(s): CHOL, HDL, LDLCALC, TRIG, CHOLHDL, LDLDIRECT in the last 72 hours. Thyroid Function Tests: No results for input(s): TSH, T4TOTAL, FREET4, T3FREE, THYROIDAB in the last 72 hours. Anemia Panel: No results for input(s): VITAMINB12, FOLATE, FERRITIN, TIBC, IRON, RETICCTPCT in the last 72 hours. Urine analysis:    Component Value Date/Time   COLORURINE YELLOW 01/30/2017 1356   APPEARANCEUR CLOUDY (A) 01/30/2017 1356   LABSPEC 1.020 01/30/2017 1356   PHURINE 6.5 01/30/2017 1356   GLUCOSEU NEGATIVE 01/30/2017 1356   HGBUR LARGE (A) 01/30/2017 1356   BILIRUBINUR NEGATIVE 01/30/2017 1356   KETONESUR NEGATIVE 01/30/2017 1356   PROTEINUR >300 (A) 01/30/2017 1356   NITRITE POSITIVE (A) 01/30/2017 1356   LEUKOCYTESUR SMALL (A) 01/30/2017 1356   Sepsis Labs: Invalid input(s): PROCALCITONIN, LACTICIDVEN  No results found for this or any previous visit (from the past 240 hour(s)).   Radiology Studies: Dg Chest 2 View  Result Date: 01/31/2017 CLINICAL DATA:  Right-sided weakness EXAM: CHEST  2 VIEW COMPARISON:  10/23/2015 FINDINGS: Left lung grossly clear. Small moderate right pleural effusion with right basilar atelectasis or pneumonia. Cardiomegaly. No pneumothorax. IMPRESSION: 1. Small to moderate right pleural effusion with right basilar atelectasis or pneumonia. Diffuse hazy appearance of right thorax likely related to  layering effusion. 2. Cardiomegaly Electronically Signed   By: Jasmine PangKim  Fujinaga M.D.   On: 01/31/2017 00:46   Ct Head Wo Contrast  Result Date: 01/30/2017 CLINICAL DATA:  Episode of slurred speech today at 9 a.m. EXAM: CT HEAD WITHOUT CONTRAST TECHNIQUE: Contiguous axial images were obtained from the base of the skull through the vertex without intravenous contrast. COMPARISON:  Head CT scan 10/21/2015 FINDINGS: Brain: The patient has a remote left PCA territory infarct which is new since the prior CT scan. There is cortical atrophy and chronic microvascular ischemic change. No evidence of acute infarct, hemorrhage, mass lesion, mass effect, midline shift or abnormal extra-axial fluid collection. No hydrocephalus or pneumocephalus. Vascular: Atherosclerosis noted. Skull: Intact. Sinuses/Orbits: Negative. Other: None. IMPRESSION: No acute abnormality. Remote left PCA territory infarct is new since the prior head CT. Atrophy and chronic microvascular ischemic change Atherosclerosis. Electronically Signed  By: Drusilla Kanner M.D.   On: 01/30/2017 13:30   Mr Brain Wo Contrast  Result Date: 01/30/2017 CLINICAL DATA:  Acute presentation with aphasia and right facial droop. EXAM: MRI HEAD WITHOUT CONTRAST TECHNIQUE: Multiplanar, multiecho pulse sequences of the brain and surrounding structures were obtained without intravenous contrast. COMPARISON:  CT same day. FINDINGS: Brain: No definite acute infarction. I question if there could be some minor acute or subacute watershed insults in the deep white matter on the left, but this is not certain. Elsewhere, the brainstem is normal. There is cerebellar atrophy without focal insult. Old small vessel infarction of the left thalamus. Cerebral hemispheres show chronic small-vessel ischemic changes throughout the hemispheric deep white matter of a mild degree with generalized cerebral atrophy. Old left occipital infarction. No evidence of mass lesion, hemorrhage or  obstructive hydrocephalus. Ventricles are prominent, consistent with central atrophy. Vascular: Major vessels at the base of the brain show flow. Skull and upper cervical spine: Negative Sinuses/Orbits: Clear/normal Other: None significant IMPRESSION: Diffusion imaging suggests that there could be minor recent infarction in the deep white matter of the left hemisphere consistent with a minor watershed insult. This is not definite however. Atrophy and chronic small vessel ischemic changes elsewhere throughout the brain. 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 Electronically Signed   By: Paulina Fusi M.D.   On: 01/30/2017 16:09   Scheduled Meds: . aspirin  300 mg Rectal Daily   Or  . aspirin  325 mg Oral Daily  . enoxaparin (LOVENOX) injection  40 mg Subcutaneous Q24H  . levofloxacin  500 mg Oral Daily  . metoprolol tartrate  25 mg Oral BID  . QUEtiapine  50 mg Oral QHS    Continuous Infusions:  Pamella Pert, MD, PhD [facility] Pager [phone]  If 7PM-7AM, please contact night-coverage [email or web address] Password [facility] 01/31/2017, 12:16 PM

## 2017-01-31 NOTE — Progress Notes (Signed)
IV team consult ordered. Following 1 attempt by IV team RN, patient refused IV access. Neurologist notified patient refused IV access. Rn re attempted to gain IV access at 0400, patient refused. Patient provided verbal consent for MRA, echo and dopplers. Patient states "anything that doesn't require picking at me."

## 2017-01-31 NOTE — Progress Notes (Signed)
STROKE TEAM PROGRESS NOTE   HISTORY OF PRESENT ILLNESS (per record) Leonard Montoya is an 76 y.o. male with a history of prior stroke in January 2018, atrial fibrillation without anticoagulation, hypertension, hyperlipidemia, and obesity.  He is a poor historian and very hard of hearing. Patient states that he noted a right facial droop and his speech was slurred, time of onset a few days preceding admission. Over the last few days he reports that his symptoms worsened.  On exam he has significant dysarthria, right facial droop, and is severely hard of hearing. Patient also has significant weakness in his lower extremities, which he says is chronic, and that he "sometimes" uses a walker at home.  The patient currently has a UTI, now on levaquin.  Date last known well: Unable to determine  Patient was not administered IV t-PA secondary to unknown LSN. He was admitted to General Neurology for further evaluation and treatment.   SUBJECTIVE (INTERVAL HISTORY) He is alone in the room.  The patient is awake and alert, and follows all commands appropriately with frequent prompting at loud volume.The patient informs me that he had a stroke in January this year and has some residual weakness in his hand and memory loss since then. The patient's specifically asked me to not involve his daughter in his medical decision making and she is annoying him. The daughter wanted to meet with me privately to discuss his medical situation. She informed me that the patient has had memory difficulties since this stroke and has been refusing to take his medications or seek medical help. He has a difficult personality and is very stubborn and will not listen to his children. He lives at home with his son but does not get along well with him as well.   OBJECTIVE Temp:  [97.5 F (36.4 C)-98.5 F (36.9 C)] 98.1 F (36.7 C) (07/13 0600) Pulse Rate:  [40-106] 59 (07/13 0814) Cardiac Rhythm: Atrial fibrillation (07/12  2047) Resp:  [14-19] 16 (07/13 0814) BP: (84-162)/(52-114) 133/90 (07/13 0814) SpO2:  [96 %-100 %] 97 % (07/13 0814) Weight:  [138.2 kg (304 lb 9.6 oz)] 138.2 kg (304 lb 9.6 oz) (07/12 2037)  CBC:  Recent Labs Lab 01/30/17 1303 01/30/17 1321  WBC 10.6*  --   NEUTROABS 6.9  --   HGB 14.0 15.3  HCT 44.7 45.0  MCV 92.2  --   PLT 158  --     Basic Metabolic Panel:  Recent Labs Lab 01/30/17 1303 01/30/17 1321  NA 137 140  K 4.2 4.0  CL 97* 96*  CO2 29  --   GLUCOSE 114* 114*  BUN 26* 33*  CREATININE 1.12 1.10  CALCIUM 9.1  --     Lipid Panel:    Component Value Date/Time   CHOL 163 09/27/2013 1643   TRIG 165 (H) 09/27/2013 1643   HDL 45 09/27/2013 1643   CHOLHDL 3.6 09/27/2013 1643   VLDL 33 09/27/2013 1643   LDLCALC 85 09/27/2013 1643   HgbA1c:  Lab Results  Component Value Date   HGBA1C 6.3 (H) 10/23/2015   Urine Drug Screen: No results found for: LABOPIA, COCAINSCRNUR, LABBENZ, AMPHETMU, THCU, LABBARB  Alcohol Level No results found for: [facility]  IMAGING  Dg Chest 2 View 01/31/2017 IMPRESSION: 1. Small to moderate right pleural effusion with right basilar atelectasis or pneumonia. Diffuse hazy appearance of right thorax likely related to layering effusion. 2. Cardiomegaly   Ct Head Wo Contrast 01/30/2017 IMPRESSION: No acute abnormality. Remote left  PCA territory infarct is new since the prior head CT. Atrophy and chronic microvascular ischemic change Atherosclerosis.  Mr Brain Wo Contrast 01/30/2017 IMPRESSION: Diffusion imaging suggests that there could be minor recent infarction in the deep white matter of the left hemisphere consistent with a minor watershed insult. This is not definite however. Atrophy and chronic small vessel ischemic changes elsewhere throughout the brain. 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     PHYSICAL EXAM Elderly Caucasian male not in distress. Appears irritable   . Afebrile. Head is nontraumatic. Neck is supple without  bruit.    Cardiac exam no murmur or gallop. Lungs are clear to auscultation. Distal pulses are well felt.2+ pedal edema bilaterally Neurological Exam :  Awake and alert oriented to place and person. Diminished attention, registration and recall. Poor insight into his condition. Easily distractible. Follows midline and one and two-step commands. Speech is clear without dysarthria or aphagia. Extraocular movements are full range without nystagmus. He blinks to threat bilaterally. Vision acuity seems adequate. Fundi were not visualized. Right lower facial weakness. Tongue midline. Motor system exam reveals no upper or lower eczema to drift but he does have diminished fine finger movements on the right and orbits left over right upper extremity. Deep tendon reflexes are symmetric. Plantars are downgoing. Gait was not tested. ASSESSMENT/PLAN [name] is a 76 y.o. male with history of  prior stroke in January 2018, atrial fibrillation without anticoagulation, hypertension, hyperlipidemia, and obesity presenting with dysarthria, right facial droop, and right-sided weakness. He did not receive IV t-PA due to arriving outside of the treatment window.   Stroke:  Diffusion imaging suggests that there could be minor recent infarction in the deep white matter of the left hemisphere   in the setting of atrial fibrillation without anticoagulation and clinical presentation cannot be explained by this imaging abnormality which at most is dubious  Resultant  right facial droop  CT head: No acute abnormality. Remote left PCA territory infarct is new since the prior head CT. Atrophy and chronic microvascular ischemic change Atherosclerosis.  MRI head: Diffusion imaging suggests that there could be minor recent small subacute infarction in the deep white matter of the left hemisphere  . This is not definite however. Atrophy and chronic small vessel ischemic changes elsewhere throughout the brain. Old left occipital  cortical infarctio  MRA head not ordered  Carotid Doppler   pending  2D Echo   pending  LDL 85  HgbA1c 6.3  SCDs for VTE prophylaxis  Diet NPO time specified  No antithrombotic prior to admission, now on aspirin 325 mg daily  Patient counseled to be compliant with his antithrombotic medications  Ongoing aggressive stroke risk factor management  Therapy recommendations: None  Disposition:   ending  Hypertension  Stable  Permissive hypertension (OK if < 220/120) but gradually normalize in 5-7 days  Long-term BP goal normotensive  Hyperlipidemia  Home meds:  none  LDL 85, goal < 70  Add atorvastatin 40mg  PO daily  Continue statin at discharge  Diabetes  HgbA1c 6.3, goal < 7.0  Controlled  Other Stroke Risk Factors  Advanced age  Obesity, borderline morbid, Body mass index is 39.11 kg/m., recommend weight loss, diet and exercise as appropriate   Hx stroke/TIA  Other Active Problems  UTI, on levaquin  Hospital day # 0   I have personally examined this patient, reviewed notes, independently viewed imaging studies, participated in medical decision making and plan of care.ROS completed by me personally and pertinent  positives fully documented  I have made any additions or clarifications directly to the above note.  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 He is had prior left occipital and thalamic infarcts and has mild cognitive impairment at baseline. Patient has been uncooperative with his stroke testing today. I counseled the patient to be compliant with this testing and his medications and medical follow-up. Start aspirin for now and if patient is compliant with therapy consider long-term anticoagulation with eliquis. Long discussion with the patient as well as  separately with his daughter in private and answered questions. Greater than 50% time during this 35 minute visit was spent on counseling and coordination of care about his small subcortical infarct, atypical fibrillation, stroke prevention and treatment discussion and answering questions.  Delia Heady, MD Medical Director [facility] Pager: [phone] 01/31/2017 4:56 PM   To contact Stroke Continuity provider, please refer to [email or web address]. After hours, contact General Neurology

## 2017-01-31 NOTE — Evaluation (Signed)
Occupational Therapy Evaluation Patient Details Name: Leonard Montoya MRN: [id number] DOB: Jan 11, 1941 Today's Date: 01/31/2017    History of Present Illness 76 yo male pmh significant for afib, gout, HTN, and asthma, adm to [facility] with right facial droop, right sided weakness and aphasia.  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   Clinical Impression   PTA Pt bedbound and required mod assist for all ADL, with the exception of Pt being able to self-feed. Pt is demonstrating decreased ability to perform ADL (see below), requiring increased level of care and skilled OT at the SNF level to maximize safety and independence in ADL. If Pt denies SNF placement, he will require HHOT. OT to defer treatment services to next venue of care.     Follow Up Recommendations  SNF;Supervision/Assistance - 24 hour    Equipment Recommendations  None recommended by OT    Recommendations for Other Services       Precautions / Restrictions Precautions Precautions: Fall Restrictions Weight Bearing Restrictions: No      Mobility Bed Mobility Overal bed mobility: Needs Assistance Bed Mobility: Supine to Sit;Sit to Supine     Supine to sit: Mod assist;HOB elevated Sit to supine: Mod assist;HOB elevated   General bed mobility comments: Pt requires mod assist for legs in and out of bed. Pt also +2 for scoot up in bed  Transfers                 General transfer comment: Pt unable to transfer at this time    Balance Overall balance assessment: Needs assistance Sitting-balance support: Single extremity supported;Feet supported Sitting balance-Leahy Scale: Poor Sitting balance - Comments: requires UE support to maintain sitting EOB                                   ADL either performed or assessed with clinical judgement   ADL Overall ADL's : Needs assistance/impaired Eating/Feeding: Minimal  assistance;Sitting;Bed level Eating/Feeding Details (indicate cue type and reason): assist for eating lunch, loading items on utensils Grooming: Sitting;Minimal assistance (EOB) Grooming Details (indicate cue type and reason): performed oral care sitting EOB. Needed assist to put toothpaste on toothbrush,  Upper Body Bathing: Maximal assistance;Bed level   Lower Body Bathing: Total assistance;Bed level   Upper Body Dressing : Maximal assistance   Lower Body Dressing: Total assistance   Toilet Transfer:  (bedpan only - Pt bedbound for past year)   Toileting- Architect and Hygiene: Total assistance   Tub/ Shower Transfer:  (Pt bed baths for past year)     General ADL Comments: Pt requiring more assist for seated ADL, decreased problem solving ability     Vision Baseline Vision/History: Wears glasses Wears Glasses: Reading only Patient Visual Report: No change from baseline       Perception     Praxis      Pertinent Vitals/Pain Pain Assessment: Faces Faces Pain Scale: Hurts little more Pain Location: Legs with movement Pain Descriptors / Indicators: Discomfort Pain Intervention(s): Repositioned     Hand Dominance Right   Extremity/Trunk Assessment Upper Extremity Assessment Upper Extremity Assessment: Generalized weakness;RUE deficits/detail (RUE deficits PTA - NOT new) RUE Deficits / Details: limited forward flexion (Pt states from previous CVA) RUE Coordination: decreased fine motor;decreased gross motor   Lower Extremity Assessment Lower Extremity Assessment: Defer to PT evaluation  Communication Communication Communication: HOH;Expressive difficulties   Cognition Arousal/Alertness: Awake/alert Behavior During Therapy: WFL for tasks assessed/performed Overall Cognitive Status: No family/caregiver present to determine baseline cognitive functioning Area of Impairment: Attention;Following commands;Safety/judgement;Problem solving;Orientation                  Orientation Level: Disoriented to;Place;Time Current Attention Level: Sustained   Following Commands: Follows one step commands with increased time;Follows one step commands inconsistently Safety/Judgement: Decreased awareness of safety   Problem Solving: Slow processing;Difficulty sequencing;Requires verbal cues;Requires tactile cues     General Comments  no family/caregiver present during session to assist with home information or prior levels of ability. Pt unreliable historian    Exercises     Shoulder Instructions      Home Living Family/patient expects to be discharged to:: Private residence Living Arrangements: Other (Comment);Alone (has home health that does all cooking, ADL etc) Available Help at Discharge: Home health Type of Home: House Home Access: Level entry     Home Layout: One level     Bathroom Shower/Tub: Tub only             Additional Comments: has been homebound for the past year      Prior Functioning/Environment Level of Independence: Needs assistance  Gait / Transfers Assistance Needed: Pt has been bedbound for the past year ADL's / Homemaking Assistance Needed: Pt had help with all ADL, and HH was doing meal prep            OT Problem List: Decreased strength;Decreased activity tolerance;Impaired balance (sitting and/or standing);Decreased cognition;Obesity      OT Treatment/Interventions:      OT Goals(Current goals can be found in the care plan section) Acute Rehab OT Goals Patient Stated Goal: to get out of here OT Goal Formulation: With patient Time For Goal Achievement: 02/14/17 Potential to Achieve Goals: Fair  OT Frequency:     Barriers to D/C:            Co-evaluation              AM-PAC PT "6 Clicks" Daily Activity     Outcome Measure Help from another person eating meals?: A Little Help from another person taking care of personal grooming?: A Little Help from another person toileting,  which includes using toliet, bedpan, or urinal?: Total Help from another person bathing (including washing, rinsing, drying)?: A Lot Help from another person to put on and taking off regular upper body clothing?: A Lot Help from another person to put on and taking off regular lower body clothing?: Total 6 Click Score: 12   End of Session Nurse Communication: Mobility status  Activity Tolerance: Patient limited by fatigue Patient left: in bed;with call bell/phone within reach;with bed alarm set  OT Visit Diagnosis: Muscle weakness (generalized) (M62.81);Other symptoms and signs involving cognitive function                Time: 1150-1219 OT Time Calculation (min): 29 min Charges:  OT General Charges $OT Visit: 1 Procedure OT Evaluation $OT Eval Moderate Complexity: 1 Procedure OT Treatments $Self Care/Home Management : 8-22 mins G-Codes: OT G-codes **NOT FOR INPATIENT CLASS** Functional Assessment Tool Used: AM-PAC 6 Clicks Daily Activity Functional Limitation: Self care Self Care Current Status (J4782): At least 60 percent but less than 80 percent impaired, limited or restricted Self Care Goal Status (N5621): At least 40 percent but less than 60 percent impaired, limited or restricted Self Care Discharge Status [phone]): At least 60 percent  but less than 80 percent impaired, limited or restricted   Sherryl MangesLaura Juliauna Stueve OTR/L [phone]  Evern BioLaura J Yen Wandell 01/31/2017, 2:08 PM

## 2017-01-31 NOTE — Progress Notes (Signed)
Permissive hypertension per neuro, Olin PiaYapundich, MD. Dr. Olin PiaYapundich update on patient status, agitation, refusal of care. Patient is calm at this time. Patient states he is not claustrophobic and agrees to go down for imaging. RN will continue to monitor patient.

## 2017-01-31 NOTE — Progress Notes (Signed)
Patient refusing lab draw, verbally aggressive with staff, using profanity. Patient reoriented to time, place, situation and provided privacy in order to calm down. RN will continue to monitor patient.

## 2017-02-01 ENCOUNTER — Inpatient Hospital Stay (HOSPITAL_COMMUNITY): Payer: Medicare HMO

## 2017-02-01 DIAGNOSIS — I480 Paroxysmal atrial fibrillation: Secondary | ICD-10-CM

## 2017-02-01 DIAGNOSIS — I63312 Cerebral infarction due to thrombosis of left middle cerebral artery: Secondary | ICD-10-CM

## 2017-02-01 DIAGNOSIS — I340 Nonrheumatic mitral (valve) insufficiency: Secondary | ICD-10-CM

## 2017-02-01 DIAGNOSIS — I361 Nonrheumatic tricuspid (valve) insufficiency: Secondary | ICD-10-CM

## 2017-02-01 LAB — RAPID URINE DRUG SCREEN, HOSP PERFORMED
AMPHETAMINES: NOT DETECTED
BENZODIAZEPINES: NOT DETECTED
Barbiturates: NOT DETECTED
Cocaine: NOT DETECTED
OPIATES: NOT DETECTED
Tetrahydrocannabinol: NOT DETECTED

## 2017-02-01 LAB — ECHOCARDIOGRAM COMPLETE
Height: 74 in
Weight: 4873.6 oz

## 2017-02-01 MED ORDER — LORATADINE 10 MG PO TABS
10.0000 mg | ORAL_TABLET | Freq: Every day | ORAL | Status: DC
  Administered 2017-02-01 – 2017-02-02 (×2): 10 mg via ORAL
  Filled 2017-02-01 (×2): qty 1

## 2017-02-01 MED ORDER — ATORVASTATIN CALCIUM 10 MG PO TABS
20.0000 mg | ORAL_TABLET | Freq: Every day | ORAL | Status: DC
  Administered 2017-02-02: 20 mg via ORAL
  Filled 2017-02-01: qty 2

## 2017-02-01 NOTE — Progress Notes (Signed)
PROGRESS NOTE  Fran LowesJoel A Fomby ZOX:[id number] DOB: 01/31/1941 DOA: 01/30/2017 PCP: Donita BrooksPickard, Warren T, MD   LOS: 1 day   Brief Narrative / Interim history: 76 y.o. male, With history of asthma, atrial fibrillation not on anti-coagulation, gout, hyperlipidemia, hypertension, obesity who came to hospital for evaluation of slurred speech since last night. Has a long standing history of non compliance and has been refusing anticoagulation in the past.   Assessment & Plan: Active Problems:   Essential hypertension, benign   Atrial fibrillation (HCC)   Morbid obesity (HCC)   Right sided weakness   UTI (urinary tract infection)   CVA (cerebral vascular accident) [facility](HCC)   CVA / slurred speech -Neurology consulted, appreciate input.  Patient with known A. fib, refusing to take anticoagulation. -Limited workup due to patient being non-cooperative  Atrial fibrillation with intermittent RVR -Started on metoprolol 25 mg twice daily, continue. -High risk for stroke, refusing anticoagulation, on aspirin  UTI -refusing IV placement, he refused IM antibiotics -Continue Levaquin, urine culture with GNR  Chronic combined systolic and diastolic CHF -Currently appears euvolemic  Hypertension -Monitor    DVT prophylaxis: Lovenox Code Status: DNR Family Communication: Discussed with daughter 7/13 Disposition Plan: home when urine cultures are back  Consultants:   Neurology   Procedures:   None   Antimicrobials:  Levaquin 7/13 >>   Subjective: - no chest pain, shortness of breath, no abdominal pain, nausea or vomiting.  He is confused  Objective: Vitals:   02/01/17 0512 02/01/17 1000 02/01/17 1003 02/01/17 1319  BP: 111/80 127/84 116/76 (!) 97/58  Pulse: 75 62 (!) 51 60  Resp: 20  20 20   Temp: 98.5 F (36.9 C)  97.7 F (36.5 C) 97.7 F (36.5 C)  TempSrc: Oral  Oral Oral  SpO2: 97%  98% 93%  Weight:      Height:        Intake/Output Summary (Last 24 hours) at 02/01/17  1537 Last data filed at 02/01/17 1200  Gross per 24 hour  Intake              120 ml  Output                0 ml  Net              120 ml   Filed Weights   01/30/17 2037  Weight: (!) 138.2 kg (304 lb 9.6 oz)    Examination:  Vitals:   02/01/17 0512 02/01/17 1000 02/01/17 1003 02/01/17 1319  BP: 111/80 127/84 116/76 (!) 97/58  Pulse: 75 62 (!) 51 60  Resp: 20  20 20   Temp: 98.5 F (36.9 C)  97.7 F (36.5 C) 97.7 F (36.5 C)  TempSrc: Oral  Oral Oral  SpO2: 97%  98% 93%  Weight:      Height:        Constitutional: NAD Respiratory: CTA biL Cardiovascular: irregular  Data Reviewed: I have independently reviewed following labs and imaging studies   CBC:  Recent Labs Lab 01/30/17 1303 01/30/17 1321  WBC 10.6*  --   NEUTROABS 6.9  --   HGB 14.0 15.3  HCT 44.7 45.0  MCV 92.2  --   PLT 158  --    Basic Metabolic Panel:  Recent Labs Lab 01/30/17 1303 01/30/17 1321  NA 137 140  K 4.2 4.0  CL 97* 96*  CO2 29  --   GLUCOSE 114* 114*  BUN 26* 33*  CREATININE 1.12 1.10  CALCIUM  9.1  --    GFR: Estimated Creatinine Clearance: 83.2 mL/min (by C-G formula based on SCr of 1.1 mg/dL). Liver Function Tests:  Recent Labs Lab 01/30/17 1303  AST 21  ALT 8*  ALKPHOS 54  BILITOT 0.6  PROT 6.5  ALBUMIN 3.5   No results for input(s): LIPASE, AMYLASE in the last 168 hours. No results for input(s): AMMONIA in the last 168 hours. Coagulation Profile:  Recent Labs Lab 01/30/17 1303  INR 1.06   Cardiac Enzymes: No results for input(s): CKTOTAL, CKMB, CKMBINDEX, TROPONINI in the last 168 hours. BNP (last 3 results) No results for input(s): PROBNP in the last 8760 hours. HbA1C: No results for input(s): HGBA1C in the last 72 hours. CBG:  Recent Labs Lab 01/31/17 0813  GLUCAP 103*   Lipid Profile: No results for input(s): CHOL, HDL, LDLCALC, TRIG, CHOLHDL, LDLDIRECT in the last 72 hours. Thyroid Function Tests: No results for input(s): TSH, T4TOTAL,  FREET4, T3FREE, THYROIDAB in the last 72 hours. Anemia Panel: No results for input(s): VITAMINB12, FOLATE, FERRITIN, TIBC, IRON, RETICCTPCT in the last 72 hours. Urine analysis:    Component Value Date/Time   COLORURINE YELLOW 01/30/2017 1356   APPEARANCEUR CLOUDY (A) 01/30/2017 1356   LABSPEC 1.020 01/30/2017 1356   PHURINE 6.5 01/30/2017 1356   GLUCOSEU NEGATIVE 01/30/2017 1356   HGBUR LARGE (A) 01/30/2017 1356   BILIRUBINUR NEGATIVE 01/30/2017 1356   KETONESUR NEGATIVE 01/30/2017 1356   PROTEINUR >300 (A) 01/30/2017 1356   NITRITE POSITIVE (A) 01/30/2017 1356   LEUKOCYTESUR SMALL (A) 01/30/2017 1356   Sepsis Labs: Invalid input(s): PROCALCITONIN, LACTICIDVEN  Recent Results (from the past 240 hour(s))  Urine culture     Status: Abnormal (Preliminary result)   Collection Time: 01/30/17  8:24 AM  Result Value Ref Range Status   Specimen Description URINE, CLEAN CATCH  Final   Special Requests NONE  Final   Culture (A)  Final    >=100,000 COLONIES/mL GRAM NEGATIVE RODS CULTURE REINCUBATED FOR BETTER GROWTH    Report Status PENDING  Incomplete     Radiology Studies: Dg Chest 2 View  Result Date: 01/31/2017 CLINICAL DATA:  Right-sided weakness EXAM: CHEST  2 VIEW COMPARISON:  10/23/2015 FINDINGS: Left lung grossly clear. Small moderate right pleural effusion with right basilar atelectasis or pneumonia. Cardiomegaly. No pneumothorax. IMPRESSION: 1. Small to moderate right pleural effusion with right basilar atelectasis or pneumonia. Diffuse hazy appearance of right thorax likely related to layering effusion. 2. Cardiomegaly Electronically Signed   By: Jasmine Pang M.D.   On: 01/31/2017 00:46   Mr Brain Wo Contrast  Result Date: 01/30/2017 CLINICAL DATA:  Acute presentation with aphasia and right facial droop. EXAM: MRI HEAD WITHOUT CONTRAST TECHNIQUE: Multiplanar, multiecho pulse sequences of the brain and surrounding structures were obtained without intravenous contrast.  COMPARISON:  CT same day. FINDINGS: Brain: No definite acute infarction. I question if there could be some minor acute or subacute watershed insults in the deep white matter on the left, but this is not certain. Elsewhere, the brainstem is normal. There is cerebellar atrophy without focal insult. Old small vessel infarction of the left thalamus. Cerebral hemispheres show chronic small-vessel ischemic changes throughout the hemispheric deep white matter of a mild degree with generalized cerebral atrophy. Old left occipital infarction. No evidence of mass lesion, hemorrhage or obstructive hydrocephalus. Ventricles are prominent, consistent with central atrophy. Vascular: Major vessels at the base of the brain show flow. Skull and upper cervical spine: Negative Sinuses/Orbits: Clear/normal Other: None  significant IMPRESSION: Diffusion imaging suggests that there could be minor recent infarction in the deep white matter of the left hemisphere consistent with a minor watershed insult. This is not definite however. Atrophy and chronic small vessel ischemic changes elsewhere throughout the brain. 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 Electronically Signed   By: Paulina Fusi M.D.   On: 01/30/2017 16:09   Mr Maxine Glenn Head/brain WU Cm  Result Date: 01/31/2017 CLINICAL DATA:  76 y/o  M; right-sided weakness. EXAM: MRA HEAD WITHOUT CONTRAST TECHNIQUE: Angiographic images of the Circle of Willis were obtained using MRA technique without intravenous contrast. COMPARISON:  01/30/2017 MRI head. FINDINGS: Internal carotid arteries:  Patent. Anterior cerebral arteries:  Patent. Middle cerebral arteries: Patent. 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 Anterior communicating artery: Patent. Posterior communicating arteries: Not identified, likely hypoplastic or absent. Posterior cerebral arteries: Patent. Moderate left P2 proximal stenosis (series 354, image 19). Basilar artery:   Patent. Vertebral arteries:  Patent. No addition evidence of high-grade stenosis, large vessel occlusion, or aneurysm. IMPRESSION: 1. No large vessel occlusion or aneurysm. 2. 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 3. Proximal left P2 short segment of moderate stenosis. Electronically Signed   By: Mitzi Hansen M.D.   On: 01/31/2017 20:17   Scheduled Meds: . aspirin  300 mg Rectal Daily   Or  . aspirin  325 mg Oral Daily  . atorvastatin  40 mg Oral q1800  . enoxaparin (LOVENOX) injection  40 mg Subcutaneous Q24H  . levofloxacin  500 mg Oral Daily  . loratadine  10 mg Oral Daily  . metoprolol tartrate  25 mg Oral BID  . QUEtiapine  50 mg Oral QHS    Continuous Infusions:  Pamella Pert, MD, PhD [facility] Pager [phone]  If 7PM-7AM, please contact night-coverage [email or web address] Password [facility] 02/01/2017, 3:37 PM

## 2017-02-01 NOTE — Progress Notes (Signed)
VASCULAR LAB PRELIMINARY  PRELIMINARY  PRELIMINARY  PRELIMINARY  Carotid duplex completed.    Preliminary report:  1-39% ICA plaquing. Vertebral artery flow is antegrade.   Yichen Gilardi, RVT 02/01/2017, 8:24 AM

## 2017-02-01 NOTE — Progress Notes (Signed)
STROKE TEAM PROGRESS NOTE   SUBJECTIVE (INTERVAL HISTORY) No family at bedside. Pt hard of hearing. He knows that he had afib not on [facility] but when I introduce anticoagulation to him, he can not understood what I was talking about. Per discussion with Leonard Montoya, he has declined [facility] in the past.    OBJECTIVE Temp:  [96.3 F (35.7 C)-98.6 F (37 C)] 98.5 F (36.9 C) (07/14 0512) Pulse Rate:  [59-95] 75 (07/14 0512) Cardiac Rhythm: Atrial fibrillation (07/13 1900) Resp:  [16-20] 20 (07/14 0512) BP: (111-155)/(80-93) 111/80 (07/14 0512) SpO2:  [95 %-99 %] 97 % (07/14 0512)  CBC:   Recent Labs Lab 01/30/17 1303 01/30/17 1321  WBC 10.6*  --   NEUTROABS 6.9  --   HGB 14.0 15.3  HCT 44.7 45.0  MCV 92.2  --   PLT 158  --     Basic Metabolic Panel:   Recent Labs Lab 01/30/17 1303 01/30/17 1321  NA 137 140  K 4.2 4.0  CL 97* 96*  CO2 29  --   GLUCOSE 114* 114*  BUN 26* 33*  CREATININE 1.12 1.10  CALCIUM 9.1  --     Lipid Panel:     Component Value Date/Time   CHOL 163 09/27/2013 1643   TRIG 165 (H) 09/27/2013 1643   HDL 45 09/27/2013 1643   CHOLHDL 3.6 09/27/2013 1643   VLDL 33 09/27/2013 1643   LDLCALC 85 09/27/2013 1643   HgbA1c:  Lab Results  Component Value Date   HGBA1C 6.3 (H) 10/23/2015   Urine Drug Screen: No results found for: LABOPIA, COCAINSCRNUR, LABBENZ, AMPHETMU, THCU, LABBARB  Alcohol Level No results found for: ETH  IMAGING I have personally reviewed the radiological images below and agree with the radiology interpretations.  Dg Chest 2 View 01/31/2017 IMPRESSION: 1. Small to moderate right pleural effusion with right basilar atelectasis or pneumonia. Diffuse hazy appearance of right thorax likely related to layering effusion. 2. Cardiomegaly   Ct Head Wo Contrast 01/30/2017 IMPRESSION: No acute abnormality. Remote left PCA territory infarct is new since the prior head CT. Atrophy and chronic microvascular ischemic change  Atherosclerosis.  Mr Brain Wo Contrast 01/30/2017 IMPRESSION: Diffusion imaging suggests that there could be minor recent infarction in the deep white matter of the left hemisphere consistent with a minor watershed insult. This is not definite however. Atrophy and chronic small vessel ischemic changes elsewhere throughout the brain. 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   CUS - Bilateral: 1-39% ICA stenosis. Vertebral artery flow is antegrade.  TTE - Left ventricle: The cavity size was normal. There was mild   concentric hypertrophy. Systolic function was mildly to   moderately reduced. The estimated ejection fraction was in the   range of 40% to 45%. There is akinesis of the   mid-apicalinferoseptal myocardium. Features are consistent with a   pseudonormal left ventricular filling pattern, with concomitant   abnormal relaxation and increased filling pressure (grade 2   diastolic dysfunction). - Aortic valve: Trileaflet; mildly thickened, mildly calcified   leaflets. - Aorta: Ascending aortic diameter: 38 mm (S). - Ascending aorta: The ascending aorta was mildly dilated. - Mitral valve: There was moderate regurgitation. - Tricuspid valve: There was moderate regurgitation. - Pulmonary arteries: Systolic pressure was mildly increased. PA   peak pressure: 31 mm Hg (S).  MRA head - 1. No large vessel occlusion or aneurysm. 2. 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 3. Proximal left P2 short segment of  moderate stenosis.   PHYSICAL EXAM Elderly Caucasian male not in distress. Hard of hearing. Afebrile. Head is nontraumatic. Neck is supple without bruit. Cardiac exam irregularly irregular heart rate and rhythm. Lungs are clear to auscultation. Distal pulses are well felt.2+ pedal edema bilaterally Neurological Exam :  Awake and alert oriented to place and person. Diminished attention, registration and recall. Poor insight into his condition. Easily  distractible. Follows midline and one and two-step commands. Speech is clear without dysarthria or aphagia. Extraocular movements are full range without nystagmus. He blinks to threat bilaterally. Vision acuity seems adequate. Fundi were not visualized. Right lower facial weakness. Hard of hearing. Tongue midline. Motor system exam reveals right UE 4/5 with pronator drift, BLE symmetrical 3/5  and orbits left over right upper extremity. Deep tendon reflexes are symmetric. Plantars are downgoing. Gait was not tested.  ASSESSMENT/PLAN Leonard Montoya is a 76 y.o. male with history of  prior stroke in January 2018, atrial fibrillation without anticoagulation, hypertension, hyperlipidemia, and obesity presenting with dysarthria, right facial droop, and right-sided weakness. He did not receive IV t-PA due to arriving outside of the treatment window.   Stroke:  Left CR patchy infarct in the setting of atrial fibrillation without anticoagulation   Resultant  right facial droop, right arm mild weakness  CT head: Remote left PCA territory infarct is new since the prior head CT.   MRI head: minor recent small subacute infarction in the deep white matter of the left CR. 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 Yes; Fluid consistency: Thin  No antithrombotic prior to admission, now on aspirin 325 mg daily. Continue ASA on discharge. Pt has refused AC in the past.  Patient counseled to be compliant with his antithrombotic medications  Ongoing aggressive stroke risk factor management  Therapy recommendations: None  Disposition:   Pending  afib not on AC  Pt has refused AC in the past  Continue cardiology followup   Continue ASA 325mg  on discharge.  Hypertension  Stable  Permissive hypertension (OK if < 220/120)  but gradually normalize in 5-7 days  Long-term BP goal normotensive  Hyperlipidemia  Home meds:  none  LDL 85, goal < 70  Add atorvastatin 40mg  PO daily  Continue statin at discharge  Diabetes  HgbA1c 6.3, goal < 7.0  Controlled  Other Stroke Risk Factors  Advanced age  Obesity, borderline morbid, Body mass index is 39.11 kg/m., recommend weight loss, diet and exercise as appropriate   Hx stroke/TIA by imaging  Other Active Problems  UTI, on levaquin  Hospital day # 1  Neurology will sign off. Please call with questions. No neuro follow up needed at this time. Thanks for the consult.  Marvel PlanJindong Annette Liotta, MD PhD Stroke Neurology 02/01/2017 11:04 PM   To contact Stroke Continuity provider, please refer to [email or web address]. After hours, contact General Neurology

## 2017-02-01 NOTE — Progress Notes (Signed)
  Echocardiogram 2D Echocardiogram has been performed.  Leonard Montoya 02/01/2017, 9:34 AM

## 2017-02-01 NOTE — Evaluation (Signed)
Speech Language Pathology Evaluation Patient Details Name: Leonard LowesJoel A Montoya MRN: [id number] DOB: 01/02/1941 Today's Date: 02/01/2017 Time: [id number] SLP Time Calculation (min) (ACUTE ONLY): 34 min  Problem List:  Patient Active Problem List   Diagnosis Date Noted  . UTI (urinary tract infection) 01/31/2017  . CVA (cerebral vascular accident) (HCC) 01/31/2017  . Right sided weakness 01/30/2017  . Shortness of breath   . Encounter for palliative care   . Goals of care, counseling/discussion   . Pressure ulcer 10/23/2015  . Acute congestive heart failure (HCC)   . Atrial fibrillation with rapid ventricular response (HCC) 10/21/2015  . Sepsis (HCC) 10/21/2015  . AKI (acute kidney injury) (HCC) 10/21/2015  . Acute CHF (congestive heart failure) (HCC) 10/21/2015  . Thrombocytopenia (HCC) 10/21/2015  . CAP (community acquired pneumonia) 10/21/2015  . Gait instability 10/18/2013  . At high risk for falls 09/28/2013  . Chronic insomnia 09/28/2013  . Essential hypertension, benign 09/27/2013  . Atrial fibrillation (HCC) 09/27/2013  . Morbid obesity (HCC) 09/27/2013  . Omphalitis in adult 09/27/2013  . Asthma, chronic 09/27/2013  . Venous stasis dermatitis 09/27/2013  . Other and unspecified hyperlipidemia 09/27/2013   Past Medical History:  Past Medical History:  Diagnosis Date  . Allergy 1978   rag weed, dust, animal dander, pollen  . Arthritis   . Asthma   . Atrial fibrillation (HCC)   . BPH (benign prostatic hypertrophy)   . Chronic insomnia   . Gout   . Hyperlipidemia   . Hypertension   . Obesity 1974   Past Surgical History: History reviewed. No pertinent surgical history. HPI:  76 y.o. male, With history of asthma, atrial fibrillation not on anti-correlation, gout, hyperlipidemia, hypertension, obesity who came to hospital for evaluation of slurred speech on 01/30/17. Patient not sure about timing of slurred speech. He is a poor historian. Patient also says that he developed  weakness of right side. He denies passing out, no chest pain or shortness of breath. No palpitations. 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   Assessment / Plan / Recommendation Clinical Impression   Pt administered the MOCA [facility](Montreal Cognitive Assessment) and received a score of 12/30 with 26/30 being a typical score within the normal range for cognition; pt stated he has "memory issues and word finding deficits" at baseline; no family member available to confirm this statement.  MOCA revealed deficits in the areas of attention, memory, executive function, language, and orientation. Speech was 100% intelligible during simple conversation which appears to have resolved from initial admission where speech was noted to be dysarthric.  Word finding deficits infrequent during simple conversational tasks and may be directly related to memory deficits rather than an expressive aphasia and/or aphasia has resolved at present. Baseline level of functioning is not known at this time, but cognitive deficits likely given pt report/clinical judgment.  Recommend ST f/u while pt in house for above deficits/providing compensatory strategies as pt permits.    SLP Assessment  SLP Recommendation/Assessment: Patient needs continued Speech Language Pathology Services SLP Visit Diagnosis: Cognitive communication deficit (R41.841)    Follow Up Recommendations  Home health SLP;Other (comment) (if pt in agreement)    Frequency and Duration min 1 x/week  1 week      SLP Evaluation Cognition  Overall Cognitive Status: No family/caregiver present to determine baseline cognitive functioning Arousal/Alertness: Awake/alert Orientation Level: Oriented to person;Oriented to place;Disoriented to  time;Disoriented to situation Attention: Sustained Sustained Attention: Impaired Sustained Attention Impairment: Verbal basic;Functional basic Memory: Impaired Memory Impairment: Storage deficit;Retrieval deficit;Decreased recall of new information;Decreased short term memory Decreased Short Term Memory: Verbal basic;Functional basic Behaviors: Verbal agitation;Perseveration Safety/Judgment: Other (comment) (DTA) Comments:  (Pt aware of bed alarm, but not why he is a fall risk)       Comprehension  Auditory Comprehension Overall Auditory Comprehension: Appears within functional limits for tasks assessed Yes/No Questions: Within Functional Limits Commands: Within Functional Limits Conversation: Complex Interfering Components: Hearing EffectiveTechniques: Increased volume;Stressing words;Visual/Gestural cues Visual Recognition/Discrimination Discrimination: Within Function Limits Reading Comprehension Reading Status: Within funtional limits    Expression Expression Primary Mode of Expression: Verbal Verbal Expression Overall Verbal Expression: Impaired Initiation: No impairment Level of Generative/Spontaneous Verbalization: Conversation Repetition: Impaired Level of Impairment: Sentence level Naming: Impairment Responsive: 76-100% accurate Confrontation: Within functional limits Convergent: 75-100% accurate Divergent: 75-100% accurate Other Naming Comments:  (Pt c/o "forgetting words" more frequently) Verbal Errors: Perseveration Interfering Components: Attention;Premorbid deficit Effective Techniques: Written cues Non-Verbal Means of Communication: Not applicable Written Expression Dominant Hand: Right Written Expression: Not tested   Oral / Motor  Oral Motor/Sensory Function Overall Oral Motor/Sensory Function: Within functional limits Motor Speech Overall Motor Speech: Appears within functional limits for tasks assessed Respiration: Within functional  limits Phonation: Normal Resonance: Within functional limits Articulation: Within functional limitis Intelligibility: Intelligible Motor Planning: Witnin functional limits Motor Speech Errors: Not applicable Interfering Components: Premorbid status;Hearing loss Effective Techniques: Slow rate;Increased vocal intensity             Functional Assessment Tool Used: NOMS; clinical judgment Functional Limitations: Memory Memory Current Status (Z6109): At least 80 percent but less than 100 percent impaired, limited or restricted Memory Goal Status (U0454): At least 60 percent but less than 80 percent impaired, limited or restricted         Tressie Stalker, M.S., CCC-SLP 02/01/2017, 12:35 PM

## 2017-02-02 MED ORDER — ASPIRIN 325 MG PO TABS: 325.0000 mg | ORAL_TABLET | Freq: Every day | ORAL | 1 refills | Status: DC

## 2017-02-02 MED ORDER — LEVOFLOXACIN 500 MG PO TABS: 500.0000 mg | ORAL_TABLET | Freq: Every day | ORAL | 0 refills | Status: DC

## 2017-02-02 MED ORDER — ATORVASTATIN CALCIUM 20 MG PO TABS: 20.0000 mg | ORAL_TABLET | Freq: Every day | ORAL | 1 refills | Status: DC

## 2017-02-02 MED ORDER — METOPROLOL TARTRATE 25 MG PO TABS: 25.0000 mg | ORAL_TABLET | Freq: Two times a day (BID) | ORAL | 1 refills | Status: DC

## 2017-02-02 NOTE — Discharge Summary (Addendum)
Physician Discharge Summary  Leonard LowesJoel A Montoya ZOX:[id number] DOB: 05/08/1941 DOA: 01/30/2017  PCP: Donita BrooksPickard, Warren T, MD  Admit date: 01/30/2017 Discharge date: 02/03/2017  Admitted From: home Disposition:  Home, refusing SNF  Recommendations for Outpatient Follow-up:  1. Follow up with PCP in 1-2 weeks 2. Follow up with Neurology in 2 months  Home Health: none Equipment/Devices: none  Discharge Condition: stable CODE STATUS: DNR Diet recommendation: heart healthy  HPI: Per Dr. Sharl MaLama, Leonard SharpsJoel Montoya  is a 76 y.o. male, With history of asthma, atrial fibrillation not on anti-correlation, gout, hyperlipidemia, hypertension, obesity who came to hospital for evaluation of slurred speech since last night. Patient not sure about timing of slurred speech. He is a poor historian. Patient also says that he developed weakness of right side. He denies passing out, no chest pain or shortness of breath. No palpitations. No nausea vomiting or diarrhea. Complains of intermittent urinary symptoms. Denies dysuria at this time. In the ED CT head was negative for stroke. Neurology was consulted, and recommended admission for stroke workup.  Hospital Course: Discharge Diagnoses:  Active Problems:   Essential hypertension, benign   Atrial fibrillation (HCC)   Morbid obesity (HCC)   Right sided weakness   UTI (urinary tract infection)   CVA (cerebral vascular accident) (HCC)   CVA / slurred speech -patient was admitted to the hospital with slurred speech concern for CVA.  Neurology was consulted and have follow patient while hospitalized.  On admission he underwent an MRI without contrast which suggested a recent infarction in the deep white matter in the left hemisphere.  Patient has a history of atrial fibrillation, was in atrial fibrillation during his hospital stay however has adamantly refused anticoagulation in the past and during this hospitalization.  Neurology recommended repeat MRI with contrast,  however patient refused.  He initially refused further workup however did agree to a 2D echo which showed an EF of 40-45%.  His A1c was 6.3.  He has been intermittently refusing blood draws, and a lipid profile could not be updated, however he was placed on a statin on discharge.  Patient did agree to aspirin and he was placed on that as well. Atrial fibrillation with intermittent RVR -cardiology consulted and have follow patient while hospitalized.  He again refused anticoagulation as mentioned above, he was started on metoprolol 25 mg twice daily, he was rate controlled and this will need to be continued on discharge.  Patient intermittently refusing medications in the hospital, unclear to what extent he will be compliant at home. UTI due to chronic foley -he refused to have an IV placed, and could not use IV antibiotics, he was started on Levaquin, clinically is improving and will be discharged with 4 additional days. Urine culture with E coli and Klebsiella both sensitive. Chronic combined systolic and diastolic CHF -Currently appears euvolemic Goals of care -discussed goals of care with the daughter, patient has intermittent confusion and is at times refusing care and is non-cooperant with testing, patient has been refusing for this MD to call family for updates and he has been clear towards me that he does not want much medical attention. He is DNR, and I approached the daughter that should he have further events to consider to avoid future hospitalizations and transition towards comfort.  Discharge Instructions   Allergies as of 02/02/2017      Reactions   Sudafed [pseudoephedrine Hcl] Other (See Comments)   Weird Dreams      Medication List  STOP taking these medications   diltiazem 180 MG 24 hr capsule Commonly known as:  CARDIZEM CD     TAKE these medications   acetaminophen 325 MG tablet Commonly known as:  TYLENOL Take 650 mg by mouth every 6 (six) hours as needed for mild  pain.   albuterol 108 (90 Base) MCG/ACT inhaler Commonly known as:  PROVENTIL HFA;VENTOLIN HFA Inhale 1-2 puffs into the lungs every 6 (six) hours as needed for wheezing.   aspirin 325 MG tablet Take 1 tablet (325 mg total) by mouth daily.   atorvastatin 20 MG tablet Commonly known as:  LIPITOR Take 1 tablet (20 mg total) by mouth daily at 6 PM.   BENADRYL 25 MG tablet Generic drug:  diphenhydrAMINE Take 25 mg by mouth daily as needed for allergies.   furosemide 40 MG tablet Commonly known as:  LASIX Take 40 mg by mouth daily. What changed:  Another medication with the same name was removed. Continue taking this medication, and follow the directions you see here.   levofloxacin 500 MG tablet Commonly known as:  LEVAQUIN Take 1 tablet (500 mg total) by mouth daily.   LORazepam 2 MG/ML concentrated solution Commonly known as:  ATIVAN Take 0.5 mLs (1 mg total) by mouth every 4 (four) hours as needed for anxiety.   metoprolol tartrate 25 MG tablet Commonly known as:  LOPRESSOR Take 1 tablet (25 mg total) by mouth 2 (two) times daily.   morphine 20 MG/ML concentrated solution Commonly known as:  ROXANOL Take 0.25 mLs (5 mg total) by mouth every 2 (two) hours as needed for severe pain.   QUEtiapine 50 MG tablet Commonly known as:  SEROQUEL Take 1 tablet (50 mg total) by mouth at bedtime.      Follow-up Information    Donita Brooks, MD. Schedule an appointment as soon as possible for a visit in 1 week(s).   Specialty:  Family Medicine Contact information: [address] [place] [address] [phone]        Marvel Plan, MD. Schedule an appointment as soon as possible for a visit in 2 month(s).   Specialty:  Neurology Contact information: [address] [place] [address] [phone]          Allergies  Allergen Reactions  . Sudafed [Pseudoephedrine Hcl] Other (See Comments)    Weird Dreams     Consultations:  Neurology  Cardiology   Procedures/Studies:  2D echo  Study Conclusions - Left ventricle: The cavity size was normal. There was mild concentric hypertrophy. Systolic function was mildly to moderately reduced. The estimated ejection fraction was in the range of 40% to 45%. There is akinesis of the mid-apicalinferoseptal myocardium. Features are consistent with a pseudonormal left ventricular filling pattern, with concomitant abnormal relaxation and increased filling pressure (grade 2 diastolic dysfunction). - Aortic valve: Trileaflet; mildly thickened, mildly calcified leaflets. - Aorta: Ascending aortic diameter: 38 mm (S). - Ascending aorta: The ascending aorta was mildly dilated. - Mitral valve: There was moderate regurgitation. - Tricuspid valve: There was moderate regurgitation. - Pulmonary arteries: Systolic pressure was mildly increased. PA peak pressure: 31 mm Hg (S).  Dg Chest 2 View  Result Date: 01/31/2017 CLINICAL DATA:  Right-sided weakness EXAM: CHEST  2 VIEW COMPARISON:  10/23/2015 FINDINGS: Left lung grossly clear. Small moderate right pleural effusion with right basilar atelectasis or pneumonia. Cardiomegaly. No pneumothorax. IMPRESSION: 1. Small to moderate right pleural effusion with right basilar atelectasis or pneumonia. Diffuse hazy  appearance of right thorax likely related to layering effusion. 2. Cardiomegaly Electronically Signed   By: Jasmine Pang M.D.   On: 01/31/2017 00:46   Ct Head Wo Contrast  Result Date: 01/30/2017 CLINICAL DATA:  Episode of slurred speech today at 9 a.m. EXAM: CT HEAD WITHOUT CONTRAST TECHNIQUE: Contiguous axial images were obtained from the base of the skull through the vertex without intravenous contrast. COMPARISON:  Head CT scan 10/21/2015 FINDINGS: Brain: The patient has a remote left PCA territory infarct which is new since the prior CT scan. There is cortical atrophy and chronic microvascular ischemic change. No  evidence of acute infarct, hemorrhage, mass lesion, mass effect, midline shift or abnormal extra-axial fluid collection. No hydrocephalus or pneumocephalus. Vascular: Atherosclerosis noted. Skull: Intact. Sinuses/Orbits: Negative. Other: None. IMPRESSION: No acute abnormality. Remote left PCA territory infarct is new since the prior head CT. Atrophy and chronic microvascular ischemic change Atherosclerosis. Electronically Signed   By: Drusilla Kanner M.D.   On: 01/30/2017 13:30   Mr Brain Wo Contrast  Result Date: 01/30/2017 CLINICAL DATA:  Acute presentation with aphasia and right facial droop. EXAM: MRI HEAD WITHOUT CONTRAST TECHNIQUE: Multiplanar, multiecho pulse sequences of the brain and surrounding structures were obtained without intravenous contrast. COMPARISON:  CT same day. FINDINGS: Brain: No definite acute infarction. I question if there could be some minor acute or subacute watershed insults in the deep white matter on the left, but this is not certain. Elsewhere, the brainstem is normal. There is cerebellar atrophy without focal insult. Old small vessel infarction of the left thalamus. Cerebral hemispheres show chronic small-vessel ischemic changes throughout the hemispheric deep white matter of a mild degree with generalized cerebral atrophy. Old left occipital infarction. No evidence of mass lesion, hemorrhage or obstructive hydrocephalus. Ventricles are prominent, consistent with central atrophy. Vascular: Major vessels at the base of the brain show flow. Skull and upper cervical spine: Negative Sinuses/Orbits: Clear/normal Other: None significant IMPRESSION: Diffusion imaging suggests that there could be minor recent infarction in the deep white matter of the left hemisphere consistent with a minor watershed insult. This is not definite however. Atrophy and chronic small vessel ischemic changes elsewhere throughout the brain. 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 Electronically Signed   By:  Paulina Fusi M.D.   On: 01/30/2017 16:09   Mr Maxine Glenn Head/brain ZO Cm  Result Date: 01/31/2017 CLINICAL DATA:  76 y/o  M; right-sided weakness. EXAM: MRA HEAD WITHOUT CONTRAST TECHNIQUE: Angiographic images of the Circle of Willis were obtained using MRA technique without intravenous contrast. COMPARISON:  01/30/2017 MRI head. FINDINGS: Internal carotid arteries:  Patent. Anterior cerebral arteries:  Patent. Middle cerebral arteries: Patent. 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 Anterior communicating artery: Patent. Posterior communicating arteries: Not identified, likely hypoplastic or absent. Posterior cerebral arteries: Patent. Moderate left P2 proximal stenosis (series 354, image 19). Basilar artery:  Patent. Vertebral arteries:  Patent. No addition evidence of high-grade stenosis, large vessel occlusion, or aneurysm. IMPRESSION: 1. No large vessel occlusion or aneurysm. 2. 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 3. Proximal left P2 short segment of moderate stenosis. Electronically Signed   By: Mitzi Hansen M.D.   On: 01/31/2017 20:17    Subjective: - no chest pain, shortness of breath, no abdominal pain, nausea or vomiting. Wants to go home   Discharge Exam: Vitals:   02/02/17 1335 02/02/17 1800  BP: 115/73 130/76  Pulse: 81 76  Resp: 18   Temp: 98.1 F (36.7 C) (!) 97.5 F (36.4 C)   Vitals:   02/02/17 0523 02/02/17 0919 02/02/17 1335 02/02/17 1800  BP: 120/73 136/79 115/73 130/76  Pulse: 80 94 81 76  Resp: 20  18   Temp: 98.7 F (37.1 C) 97.9 F (36.6 C) 98.1 F (36.7 C) (!) 97.5 F (36.4 C)  TempSrc: Oral Oral Oral Oral  SpO2: 99% 96% 97% 96%  Weight:      Height:        General: Pt is alert, awake, not in acute distress  The results of significant diagnostics from this hospitalization (including imaging, microbiology, ancillary and laboratory) are listed below for  reference.     Microbiology: Recent Results (from the past 240 hour(s))  Urine culture     Status: Abnormal   Collection Time: 01/30/17  8:24 AM  Result Value Ref Range Status   Specimen Description URINE, CLEAN CATCH  Final   Special Requests NONE  Final   Culture (A)  Final    >=100,000 COLONIES/mL ESCHERICHIA COLI 80,000 COLONIES/mL KLEBSIELLA PNEUMONIAE    Report Status 02/03/2017 FINAL  Final   Organism ID, Bacteria ESCHERICHIA COLI (A)  Final   Organism ID, Bacteria KLEBSIELLA PNEUMONIAE (A)  Final      Susceptibility   Escherichia coli - MIC*    AMPICILLIN >=32 RESISTANT Resistant     CEFAZOLIN <=4 SENSITIVE Sensitive     CEFTRIAXONE <=1 SENSITIVE Sensitive     CIPROFLOXACIN <=0.25 SENSITIVE Sensitive     GENTAMICIN <=1 SENSITIVE Sensitive     IMIPENEM <=0.25 SENSITIVE Sensitive     NITROFURANTOIN <=16 SENSITIVE Sensitive     TRIMETH/SULFA <=20 SENSITIVE Sensitive     AMPICILLIN/SULBACTAM >=32 RESISTANT Resistant     PIP/TAZO <=4 SENSITIVE Sensitive     Extended ESBL NEGATIVE Sensitive     * >=100,000 COLONIES/mL ESCHERICHIA COLI   Klebsiella pneumoniae - MIC*    AMPICILLIN >=32 RESISTANT Resistant     CEFAZOLIN <=4 SENSITIVE Sensitive     CEFTRIAXONE <=1 SENSITIVE Sensitive     CIPROFLOXACIN <=0.25 SENSITIVE Sensitive     GENTAMICIN <=1 SENSITIVE Sensitive     IMIPENEM <=0.25 SENSITIVE Sensitive     NITROFURANTOIN 32 SENSITIVE Sensitive     TRIMETH/SULFA <=20 SENSITIVE Sensitive     AMPICILLIN/SULBACTAM 4 SENSITIVE Sensitive     PIP/TAZO <=4 SENSITIVE Sensitive     Extended ESBL NEGATIVE Sensitive     * 80,000 COLONIES/mL KLEBSIELLA PNEUMONIAE     Labs: BNP (last 3 results) No results for input(s): BNP in the last 8760 hours. Basic Metabolic Panel:  Recent Labs Lab 01/30/17 1303 01/30/17 1321  NA 137 140  K 4.2 4.0  CL 97* 96*  CO2 29  --   GLUCOSE 114* 114*  BUN 26* 33*  CREATININE 1.12 1.10  CALCIUM 9.1  --    Liver Function Tests:  Recent  Labs Lab 01/30/17 1303  AST 21  ALT 8*  ALKPHOS 54  BILITOT 0.6  PROT 6.5  ALBUMIN 3.5   No results for input(s): LIPASE, AMYLASE in the last 168 hours. No results for input(s): AMMONIA in the last 168 hours. CBC:  Recent Labs Lab 01/30/17 1303 01/30/17 1321  WBC 10.6*  --   NEUTROABS 6.9  --   HGB 14.0 15.3  HCT 44.7 45.0  MCV 92.2  --   PLT 158  --    Cardiac Enzymes: No results for input(s): CKTOTAL, CKMB, CKMBINDEX, TROPONINI  in the last 168 hours. BNP: Invalid input(s): POCBNP CBG:  Recent Labs Lab 01/31/17 0813  GLUCAP 103*   D-Dimer No results for input(s): DDIMER in the last 72 hours. Hgb A1c No results for input(s): HGBA1C in the last 72 hours. Lipid Profile No results for input(s): CHOL, HDL, LDLCALC, TRIG, CHOLHDL, LDLDIRECT in the last 72 hours. Thyroid function studies No results for input(s): TSH, T4TOTAL, T3FREE, THYROIDAB in the last 72 hours.  Invalid input(s): FREET3 Anemia work up No results for input(s): VITAMINB12, FOLATE, FERRITIN, TIBC, IRON, RETICCTPCT in the last 72 hours. Urinalysis    Component Value Date/Time   COLORURINE YELLOW 01/30/2017 1356   APPEARANCEUR CLOUDY (A) 01/30/2017 1356   LABSPEC 1.020 01/30/2017 1356   PHURINE 6.5 01/30/2017 1356   GLUCOSEU NEGATIVE 01/30/2017 1356   HGBUR LARGE (A) 01/30/2017 1356   BILIRUBINUR NEGATIVE 01/30/2017 1356   KETONESUR NEGATIVE 01/30/2017 1356   PROTEINUR >300 (A) 01/30/2017 1356   NITRITE POSITIVE (A) 01/30/2017 1356   LEUKOCYTESUR SMALL (A) 01/30/2017 1356   Sepsis Labs Invalid input(s): PROCALCITONIN,  WBC,  LACTICIDVEN Microbiology Recent Results (from the past 240 hour(s))  Urine culture     Status: Abnormal   Collection Time: 01/30/17  8:24 AM  Result Value Ref Range Status   Specimen Description URINE, CLEAN CATCH  Final   Special Requests NONE  Final   Culture (A)  Final    >=100,000 COLONIES/mL ESCHERICHIA COLI 80,000 COLONIES/mL KLEBSIELLA PNEUMONIAE     Report Status 02/03/2017 FINAL  Final   Organism ID, Bacteria ESCHERICHIA COLI (A)  Final   Organism ID, Bacteria KLEBSIELLA PNEUMONIAE (A)  Final      Susceptibility   Escherichia coli - MIC*    AMPICILLIN >=32 RESISTANT Resistant     CEFAZOLIN <=4 SENSITIVE Sensitive     CEFTRIAXONE <=1 SENSITIVE Sensitive     CIPROFLOXACIN <=0.25 SENSITIVE Sensitive     GENTAMICIN <=1 SENSITIVE Sensitive     IMIPENEM <=0.25 SENSITIVE Sensitive     NITROFURANTOIN <=16 SENSITIVE Sensitive     TRIMETH/SULFA <=20 SENSITIVE Sensitive     AMPICILLIN/SULBACTAM >=32 RESISTANT Resistant     PIP/TAZO <=4 SENSITIVE Sensitive     Extended ESBL NEGATIVE Sensitive     * >=100,000 COLONIES/mL ESCHERICHIA COLI   Klebsiella pneumoniae - MIC*    AMPICILLIN >=32 RESISTANT Resistant     CEFAZOLIN <=4 SENSITIVE Sensitive     CEFTRIAXONE <=1 SENSITIVE Sensitive     CIPROFLOXACIN <=0.25 SENSITIVE Sensitive     GENTAMICIN <=1 SENSITIVE Sensitive     IMIPENEM <=0.25 SENSITIVE Sensitive     NITROFURANTOIN 32 SENSITIVE Sensitive     TRIMETH/SULFA <=20 SENSITIVE Sensitive     AMPICILLIN/SULBACTAM 4 SENSITIVE Sensitive     PIP/TAZO <=4 SENSITIVE Sensitive     Extended ESBL NEGATIVE Sensitive     * 80,000 COLONIES/mL KLEBSIELLA PNEUMONIAE     Time coordinating discharge: 35 minutes  SIGNED:  Pamella Pert, MD  [facility] 02/03/2017, 11:59 AM Pager [phone]  If 7PM-7AM, please contact night-coverage [email or web address] Password [email or web address]

## 2017-02-02 NOTE — Progress Notes (Signed)
Patient refusing SNF, family to provide care after DC. Packet for PTAR provided. RN at bedside to call to schedule when ready.

## 2017-02-02 NOTE — Progress Notes (Signed)
Patient is being discharged home. Daughter [facility](PO[facility]) is requesting that patient be transferred [place] [facility] through their insurance. Patient's daughter was notified that she needed to bring the patient wheelchair from home along with his sling for his [employer] lift. Patients daughter stated she will be here by 7:30pm and transport will be here at 8:00 per [employer] representative.

## 2017-02-02 NOTE — Progress Notes (Signed)
Patient discharged home. Discharge instructions were reviewed with patient's daughter (POA). Daughter verbalized understanding. Patient was updated. [facility]

## 2017-02-02 NOTE — Discharge Instructions (Signed)
Follow with Susy Frizzle, MD in 5-7 days  Please get a complete blood count and chemistry panel checked by your Primary MD at your next visit, and again as instructed by your Primary MD. Please get your medications reviewed and adjusted by your Primary MD.  Please request your Primary MD to go over all Hospital Tests and Procedure/Radiological results at the follow up, please get all Hospital records sent to your Prim MD by signing hospital release before you go home.  If you had Pneumonia of Lung problems at the Hospital: Please get a 2 view Chest X ray done in 6-8 weeks after hospital discharge or sooner if instructed by your Primary MD.  If you have Congestive Heart Failure: Please call your Cardiologist or Primary MD anytime you have any of the following symptoms:  1) 3 pound weight gain in 24 hours or 5 pounds in 1 week  2) shortness of breath, with or without a dry hacking cough  3) swelling in the hands, feet or stomach  4) if you have to sleep on extra pillows at night in order to breathe  Follow cardiac low salt diet and 1.5 lit/day fluid restriction.  If you have diabetes Accuchecks 4 times/day, Once in AM empty stomach and then before each meal. Log in all results and show them to your primary doctor at your next visit. If any glucose reading is under 80 or above 300 call your primary MD immediately.  If you have Seizure/Convulsions/Epilepsy: Please do not drive, operate heavy machinery, participate in activities at heights or participate in high speed sports until you have seen by Primary MD or a Neurologist and advised to do so again.  If you had Gastrointestinal Bleeding: Please ask your Primary MD to check a complete blood count within one week of discharge or at your next visit. Your endoscopic/colonoscopic biopsies that are pending at the time of discharge, will also need to followed by your Primary MD.  Get Medicines reviewed and adjusted. Please take all your  medications with you for your next visit with your Primary MD  Please request your Primary MD to go over all hospital tests and procedure/radiological results at the follow up, please ask your Primary MD to get all Hospital records sent to his/her office.  If you experience worsening of your admission symptoms, develop shortness of breath, life threatening emergency, suicidal or homicidal thoughts you must seek medical attention immediately by calling 911 or calling your MD immediately  if symptoms less severe.  You must read complete instructions/literature along with all the possible adverse reactions/side effects for all the Medicines you take and that have been prescribed to you. Take any new Medicines after you have completely understood and accpet all the possible adverse reactions/side effects.   Do not drive or operate heavy machinery when taking Pain medications.   Do not take more than prescribed Pain, Sleep and Anxiety Medications  Special Instructions: If you have smoked or chewed Tobacco  in the last 2 yrs please stop smoking, stop any regular Alcohol  and or any Recreational drug use.  Wear Seat belts while driving.  Please note You were cared for by a hospitalist during your hospital stay. If you have any questions about your discharge medications or the care you received while you were in the hospital after you are discharged, you can call the unit and asked to speak with the hospitalist on call if the hospitalist that took care of you is not available.  Once you are discharged, your primary care physician will handle any further medical issues. Please note that NO REFILLS for any discharge medications will be authorized once you are discharged, as it is imperative that you return to your primary care physician (or establish a relationship with a primary care physician if you do not have one) for your aftercare needs so that they can reassess your need for medications and monitor your  lab values.  You can reach the hospitalist office at phone [phone] or fax [phone]   If you do not have a primary care physician, you can call [phone] for a physician referral.  Activity: As tolerated with Full fall precautions use walker/cane & assistance as needed  Diet: heart healthy  Disposition Home

## 2017-02-03 LAB — VAS US CAROTID
LCCAPDIAS: 8 cm/s
LCCAPSYS: 43 cm/s
LEFT ECA DIAS: -10 cm/s
LEFT VERTEBRAL DIAS: -9 cm/s
LICAPDIAS: -15 cm/s
Left CCA dist dias: -11 cm/s
Left CCA dist sys: -39 cm/s
Left ICA dist dias: -21 cm/s
Left ICA dist sys: -41 cm/s
Left ICA prox sys: -31 cm/s
RCCAPDIAS: -7 cm/s
RIGHT ECA DIAS: -11 cm/s
RIGHT VERTEBRAL DIAS: 13 cm/s
Right CCA prox sys: -43 cm/s
Right cca dist sys: -54 cm/s

## 2017-02-03 LAB — URINE CULTURE: Culture: >=100000 — AB

## 2017-04-03 IMAGING — DX DG CHEST 2V
2 series · 3 of 3 positions shown · non-contrast
Comparison: None.

CLINICAL DATA: Short of breath for 2 weeks

EXAM:
CHEST  2 VIEW

[Series 2: chest lat · 0.14mm/px · 2 of 2 slices shown]
[im 1/2]
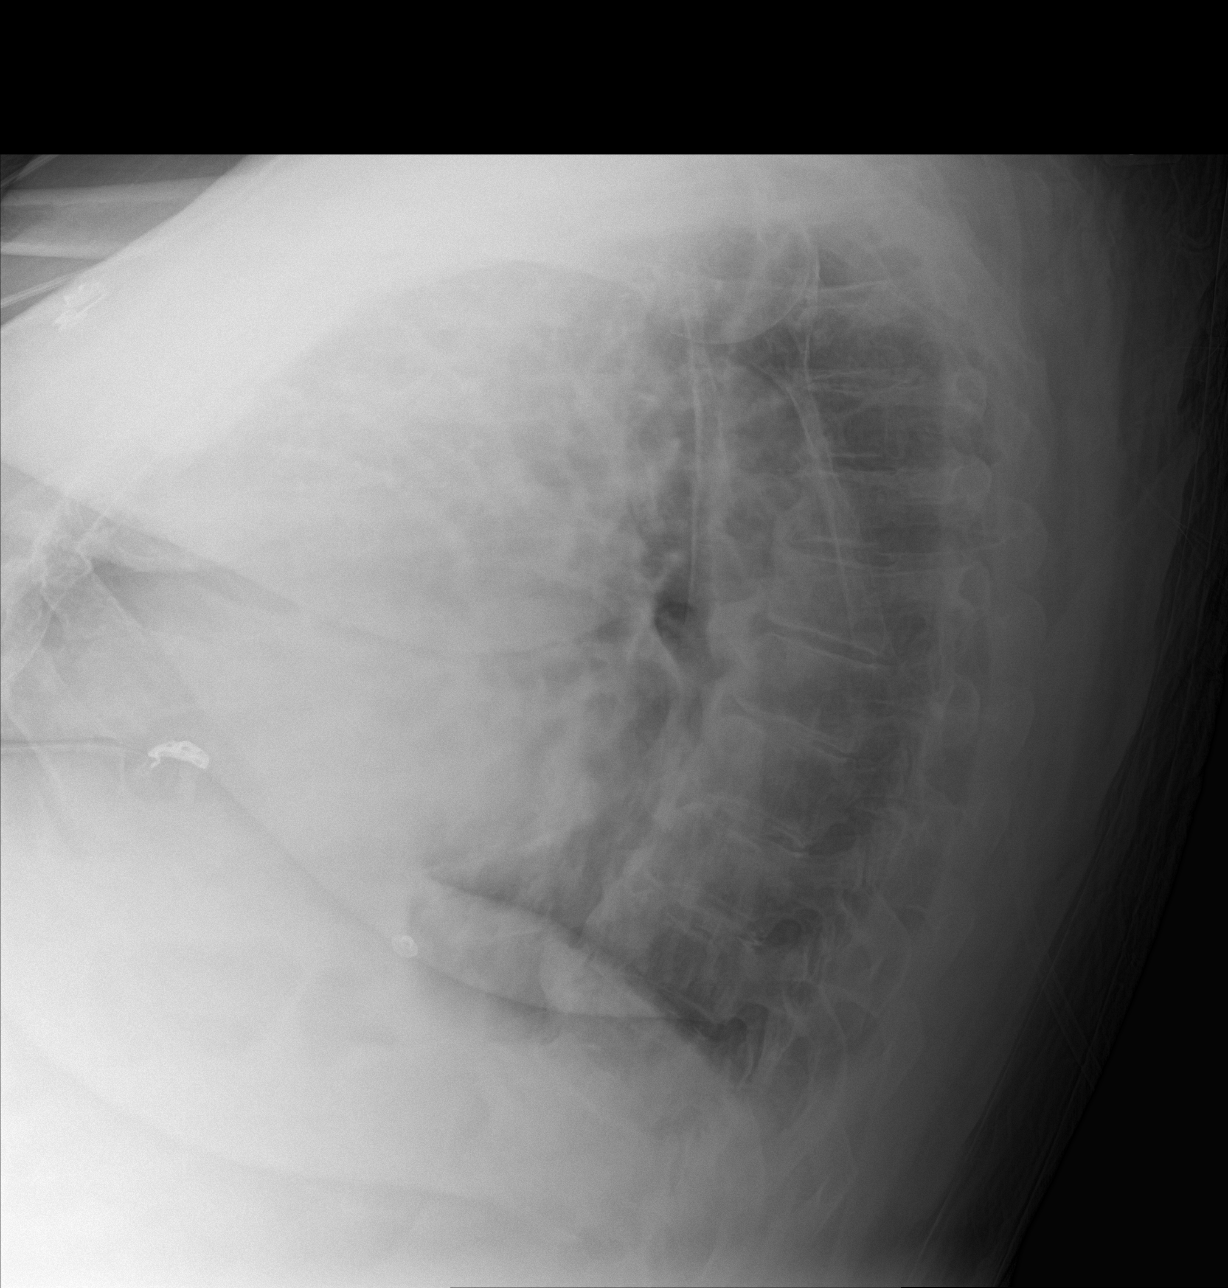
[im 2/2]
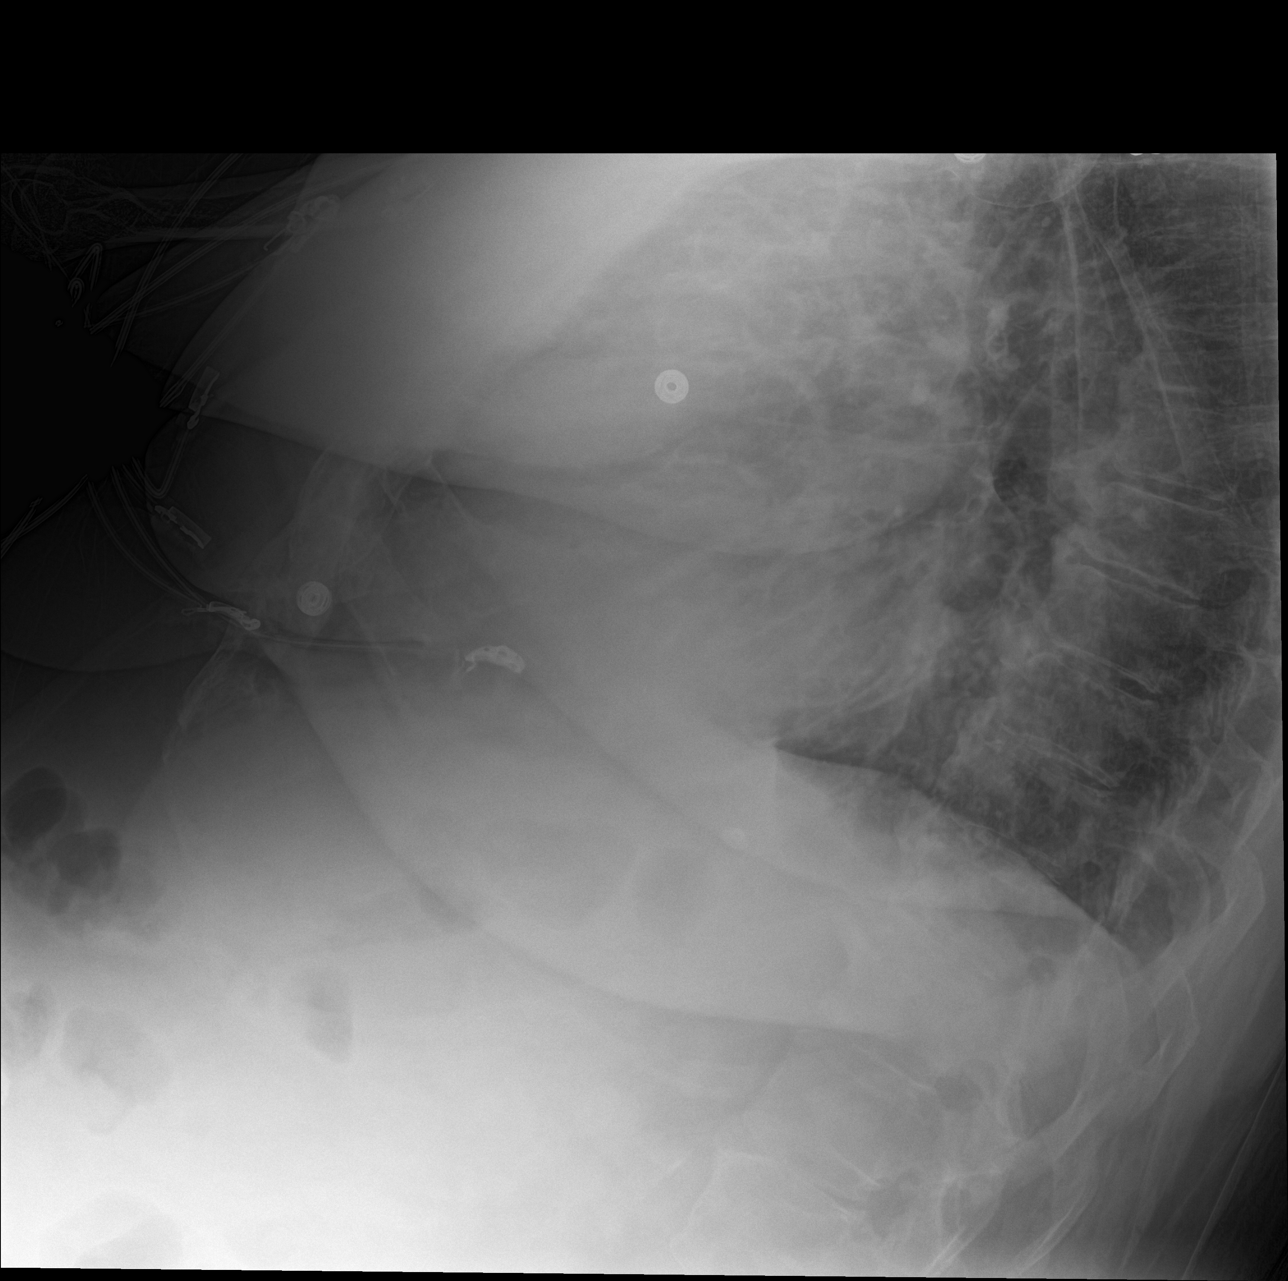

[chest ap]
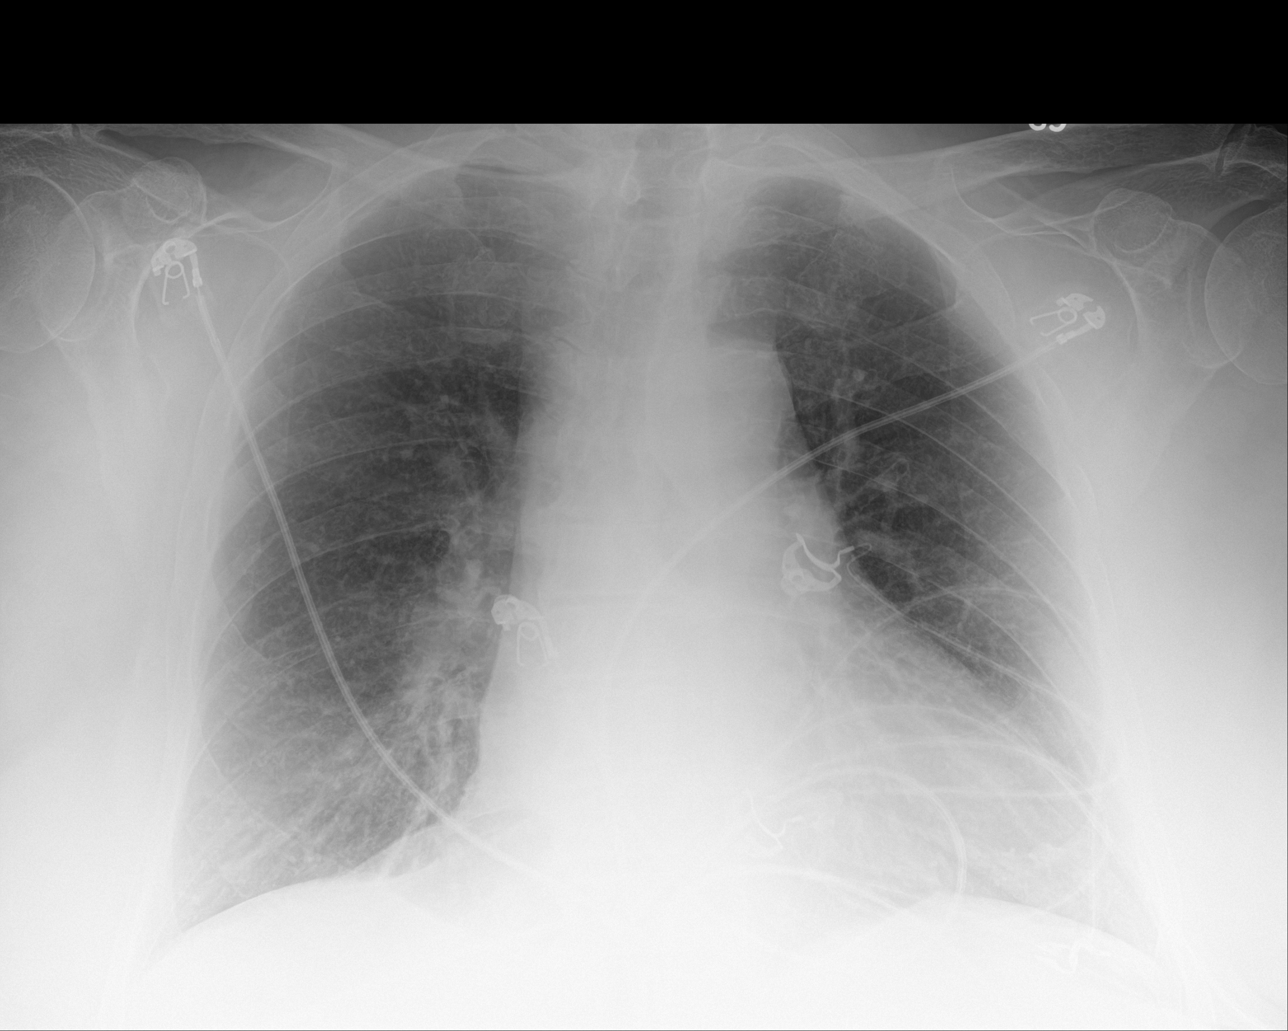

[3 of 3 positions shown; findings below may reference images not displayed]

FINDINGS: Mild cardiomegaly. Normal vascularity. Lungs are under aerated with
bibasilar hypoaeration change. No definite consolidation. No
pneumothorax or pleural effusion.
IMPRESSION: Cardiomegaly without decompensation

## 2017-05-12 IMAGING — CT CT HEAD W/O CM
2 series · 15 of 30 positions shown, 19 images · non-contrast
Comparison: None.

CLINICAL DATA: Found down. Past medical history of hypertension,
asthma and atrial fibrillation.

EXAM:
CT HEAD WITHOUT CONTRAST
TECHNIQUE: Contiguous axial images were obtained from the base of the skull
through the vertex without intravenous contrast.

[Series 301: head w/o, idose (1) · axial · non-contrast · 0.45mm/px · z∈[+117,+277]mm · 13 of 38 slices shown, 17 images]
[im 3/38  brain]
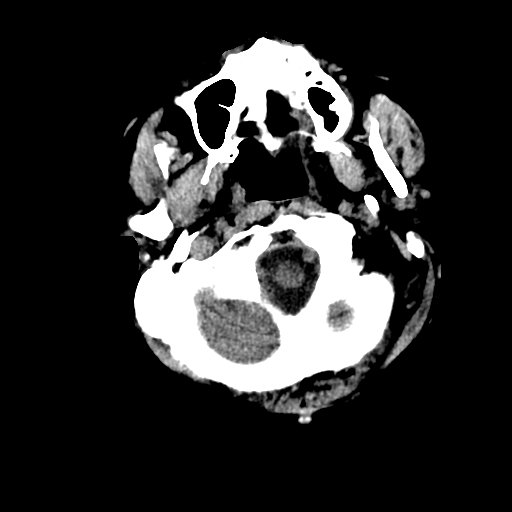
[im 3/38  bone]
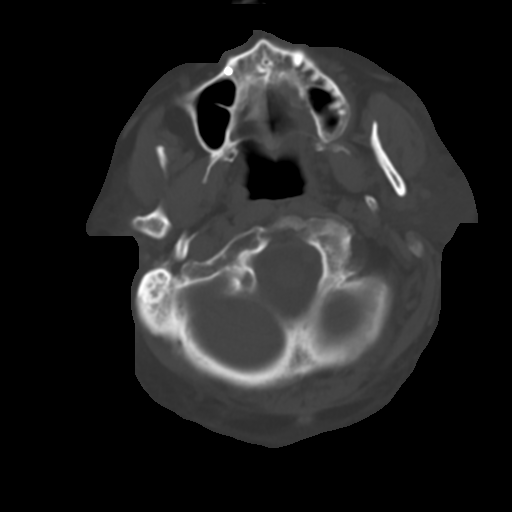
[im 6/38  brain]
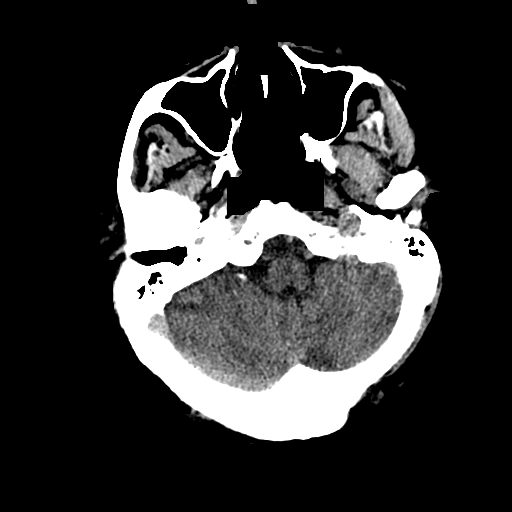
[im 8/38  brain]
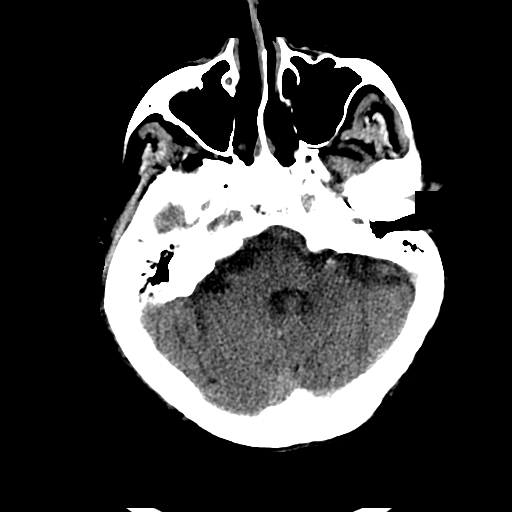
[im 11/38  brain]
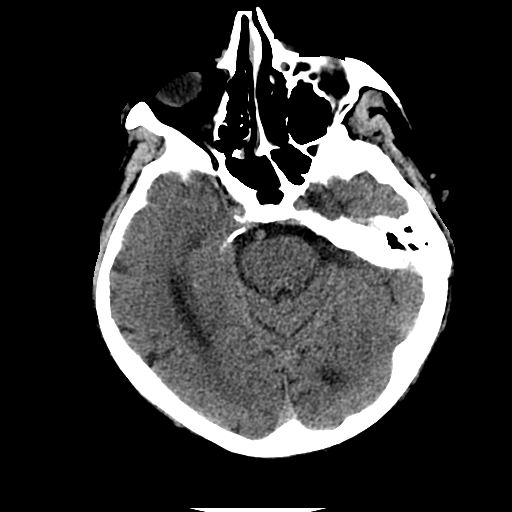
[im 14/38  brain]
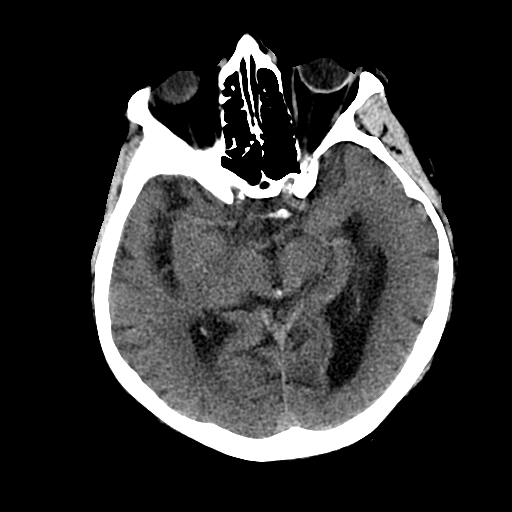
[im 14/38  bone]
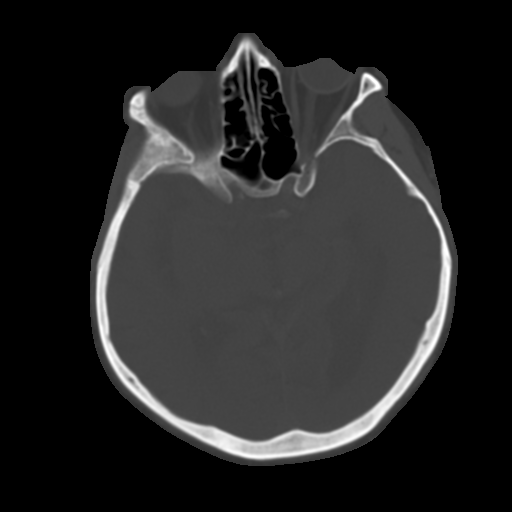
[im 16/38  brain]
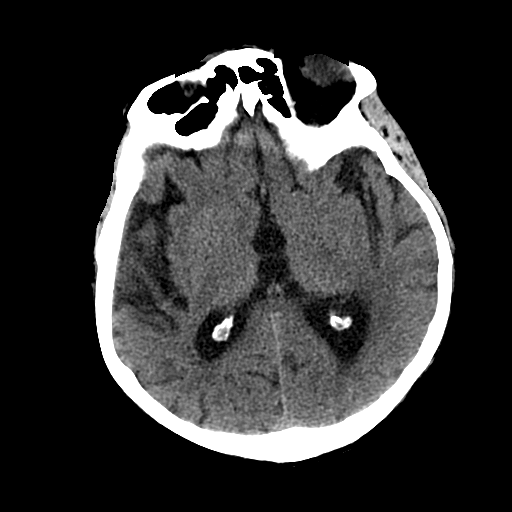
[im 19/38  brain]
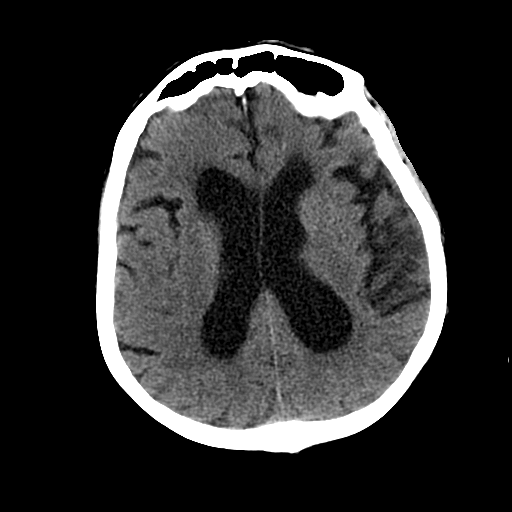
[im 22/38  brain]
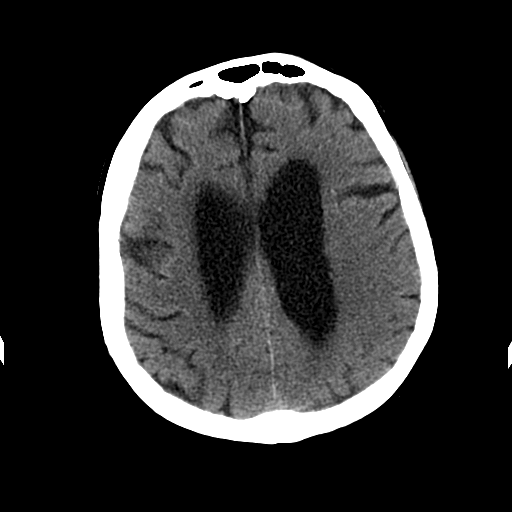
[im 24/38  brain]
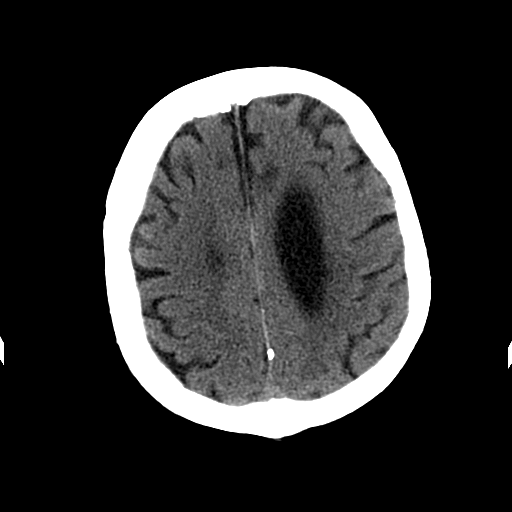
[im 24/38  bone]
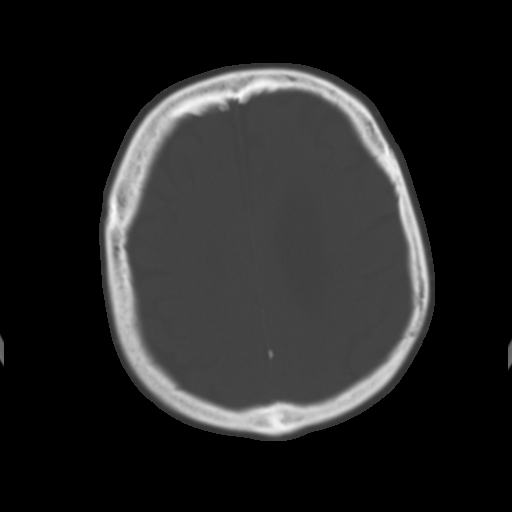
[im 27/38  brain]
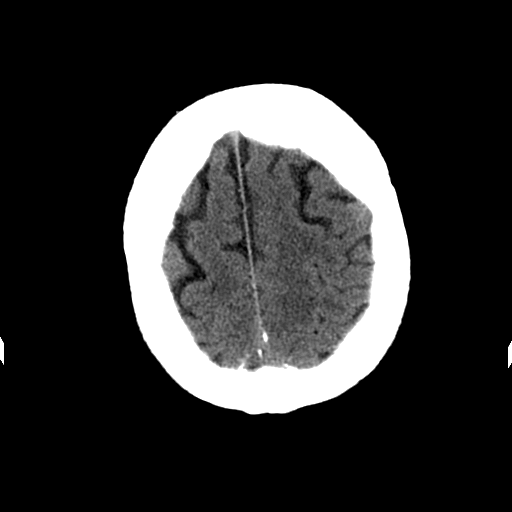
[im 30/38  brain]
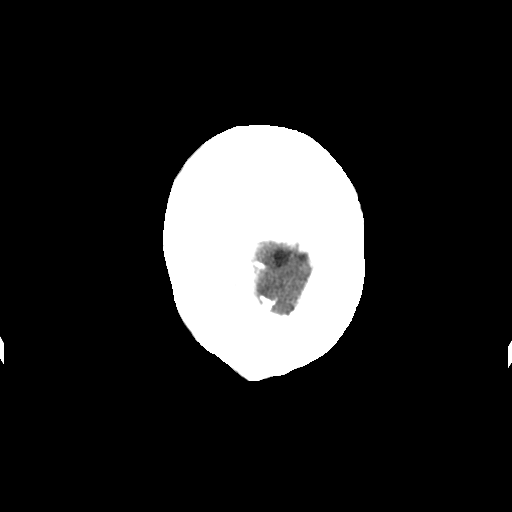
[im 32/38  brain]
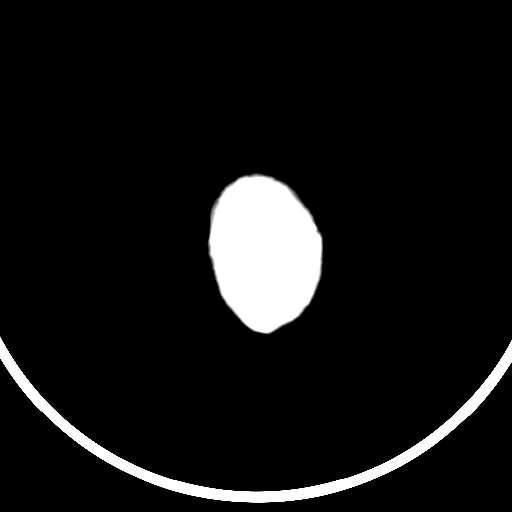
[im 35/38  brain]
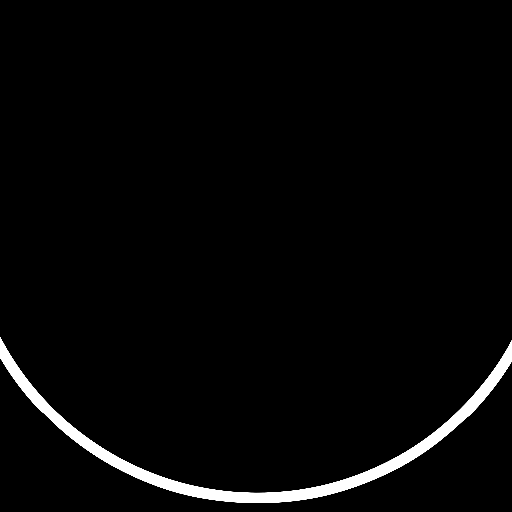
[im 35/38  bone]
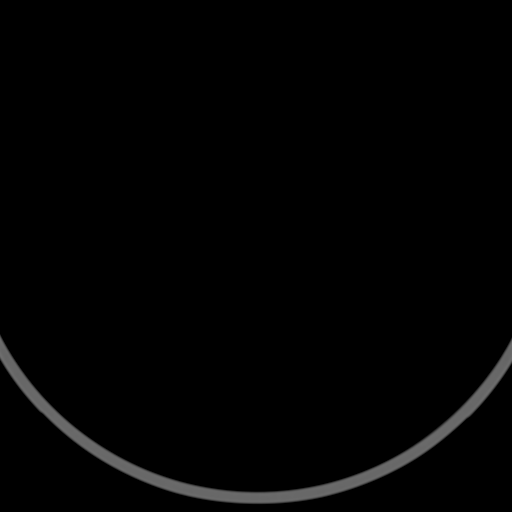

[Series 302: head w/o bone, idose (1) · axial · non-contrast · 0.45mm/px · z∈[+117,+142]mm · 2 of 38 slices shown]
[im 3/38  bone]
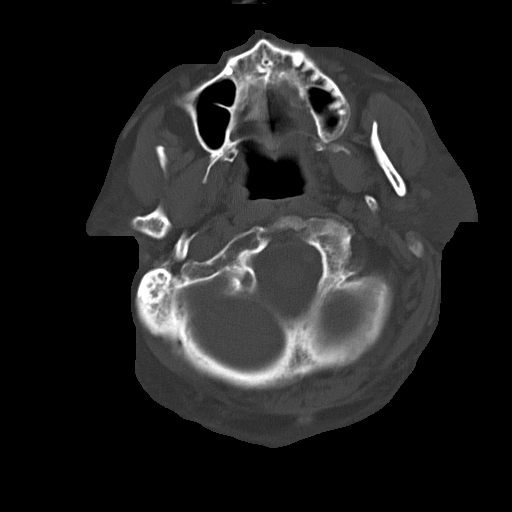
[im 8/38  bone]
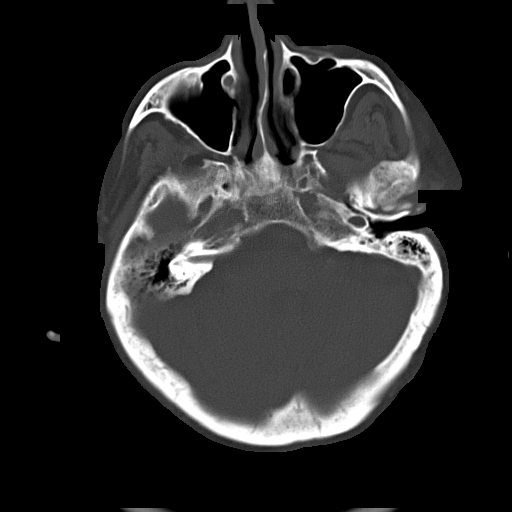

[15 of 30 positions shown; findings below may reference images not displayed]

FINDINGS: Brain: There is generalized brain atrophy with commensurate
dilatation of the sulci. Moderate ventriculomegaly is slightly out
of proportion to the degree of brain atrophy.

There is no mass, hemorrhage, edema or other evidence of acute
parenchymal abnormality. No extra-axial hemorrhage.

Vascular: No hyperdense vessel or unexpected calcification.

Skull: Negative for fracture or focal lesion.

Sinuses/Orbits: No acute findings.

Other: None.
IMPRESSION: 1. No acute findings. No intracranial mass, hemorrhage or edema. No
skull fracture.
2. Moderate ventriculomegaly which is slightly out of proportion to
the degree of brain atrophy raising the possibility of a chronic
normal pressure hydrocephalus.

## 2017-05-12 IMAGING — CR DG CHEST 1V PORT
2 series · 2 of 2 positions shown · non-contrast
Comparison: Chest radiograph performed 09/12/2015

CLINICAL DATA: Acute onset of shortness of breath, generalized
weakness and altered mental status. Initial encounter.

EXAM:
PORTABLE CHEST 1 VIEW

[AP (1 of 2)]
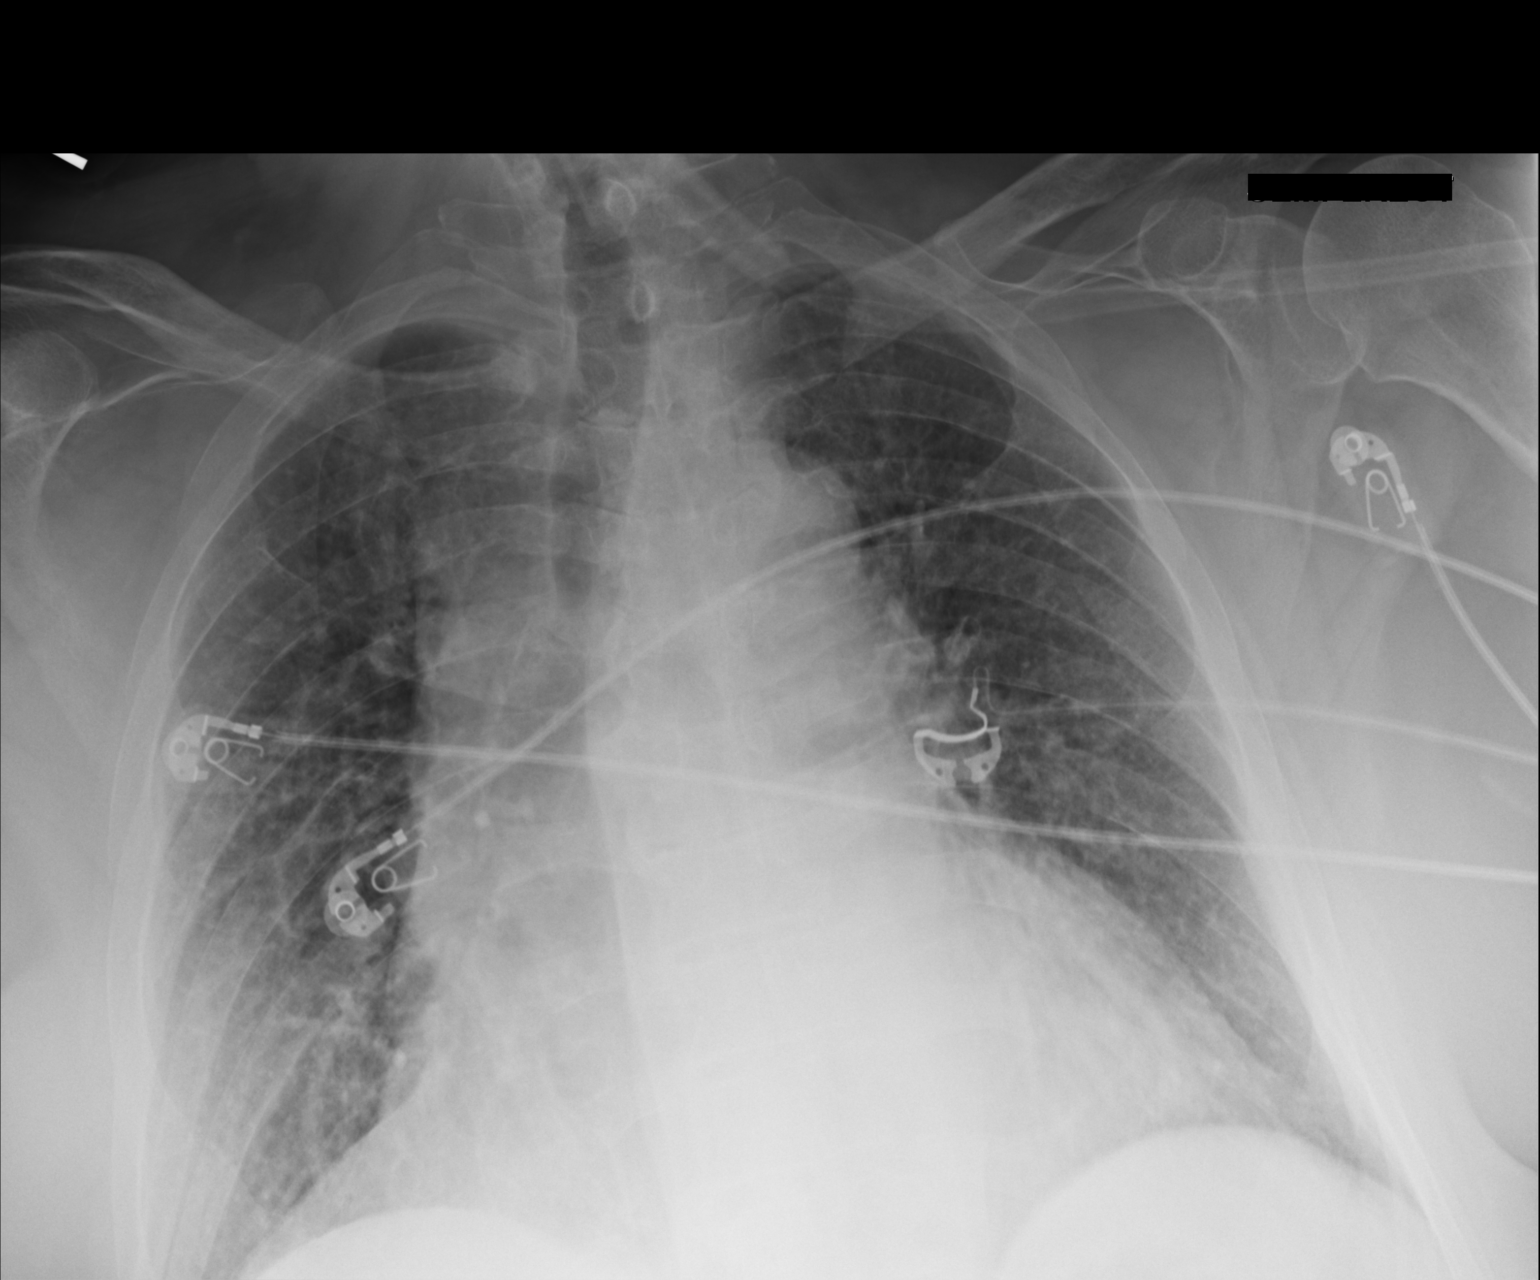

[AP (2 of 2)]
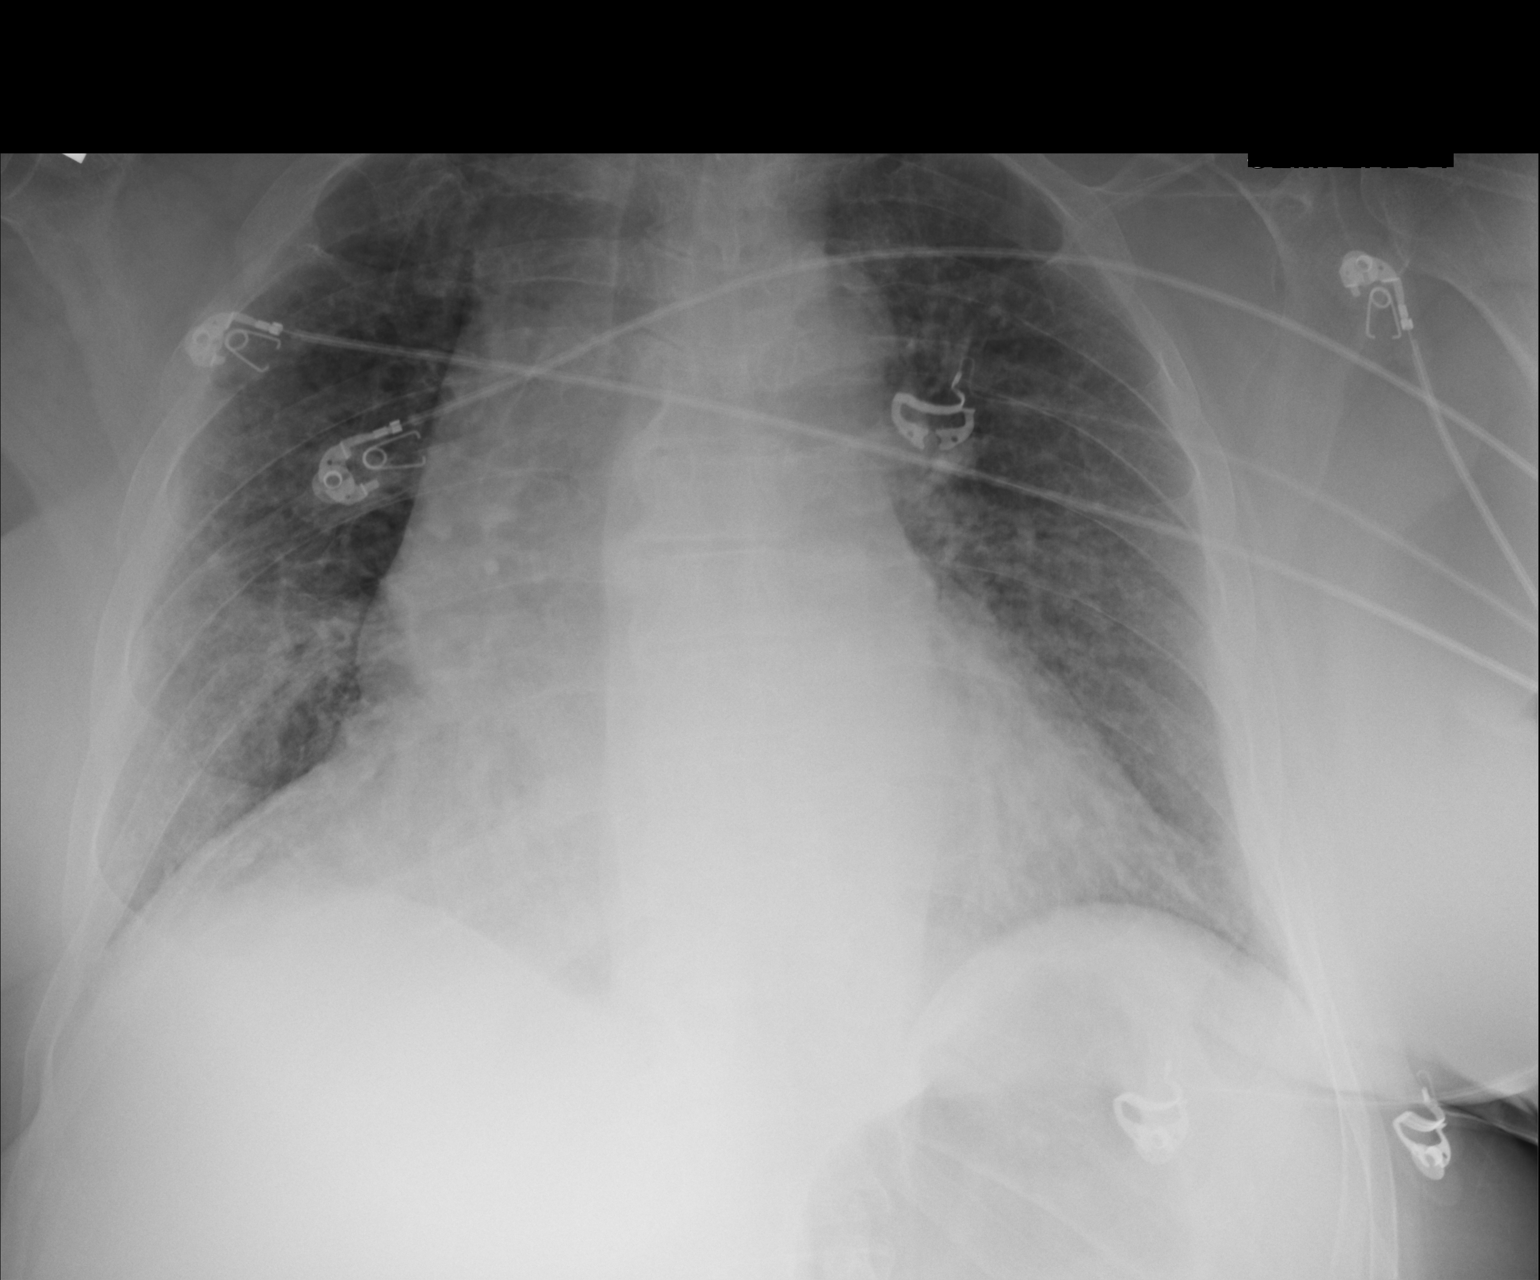

[2 of 2 positions shown; findings below may reference images not displayed]

FINDINGS: The lungs are well-aerated. Vascular congestion is noted. Increased
interstitial markings may reflect mild interstitial edema. Mild
right basilar pneumonia cannot be excluded. No pleural effusion or
pneumothorax is seen.

The cardiomediastinal silhouette is enlarged. No acute osseous
abnormalities are seen.
IMPRESSION: Vascular congestion and cardiomegaly. Increased interstitial
markings may reflect mild interstitial edema. Mild right basilar
pneumonia cannot be excluded.

## 2017-11-21 ENCOUNTER — Other Ambulatory Visit: Payer: Self-pay

## 2017-11-21 ENCOUNTER — Emergency Department (HOSPITAL_COMMUNITY): Payer: Medicare Other

## 2017-11-21 ENCOUNTER — Encounter (HOSPITAL_COMMUNITY): Payer: Self-pay | Admitting: *Deleted

## 2017-11-21 ENCOUNTER — Emergency Department (HOSPITAL_BASED_OUTPATIENT_CLINIC_OR_DEPARTMENT_OTHER)
Admit: 2017-11-21 | Discharge: 2017-11-21 | Disposition: A | Discharge status: Home / Self Care | Payer: Medicare Other | Attending: Emergency Medicine | Admitting: Emergency Medicine

## 2017-11-21 ENCOUNTER — Inpatient Hospital Stay (HOSPITAL_COMMUNITY)
Admission: EM | Admit: 2017-11-21 | Discharge: 2017-11-24 | DRG: 194 | Disposition: A | Discharge status: Home / Self Care | Payer: Medicare Other | Attending: Internal Medicine | Admitting: Internal Medicine

## 2017-11-21 DIAGNOSIS — B1920 Unspecified viral hepatitis C without hepatic coma: Secondary | ICD-10-CM | POA: Diagnosis present

## 2017-11-21 DIAGNOSIS — Z87891 Personal history of nicotine dependence: Secondary | ICD-10-CM

## 2017-11-21 DIAGNOSIS — H9190 Unspecified hearing loss, unspecified ear: Secondary | ICD-10-CM | POA: Diagnosis not present

## 2017-11-21 DIAGNOSIS — Z7951 Long term (current) use of inhaled steroids: Secondary | ICD-10-CM | POA: Diagnosis not present

## 2017-11-21 DIAGNOSIS — I481 Persistent atrial fibrillation: Secondary | ICD-10-CM | POA: Diagnosis not present

## 2017-11-21 DIAGNOSIS — K7460 Unspecified cirrhosis of liver: Secondary | ICD-10-CM | POA: Diagnosis present

## 2017-11-21 DIAGNOSIS — Z7401 Bed confinement status: Secondary | ICD-10-CM

## 2017-11-21 DIAGNOSIS — I4891 Unspecified atrial fibrillation: Secondary | ICD-10-CM | POA: Diagnosis present

## 2017-11-21 DIAGNOSIS — Z66 Do not resuscitate: Secondary | ICD-10-CM | POA: Diagnosis present

## 2017-11-21 DIAGNOSIS — R32 Unspecified urinary incontinence: Secondary | ICD-10-CM | POA: Diagnosis not present

## 2017-11-21 DIAGNOSIS — R0902 Hypoxemia: Secondary | ICD-10-CM | POA: Diagnosis not present

## 2017-11-21 DIAGNOSIS — I482 Chronic atrial fibrillation: Secondary | ICD-10-CM | POA: Diagnosis not present

## 2017-11-21 DIAGNOSIS — E785 Hyperlipidemia, unspecified: Secondary | ICD-10-CM | POA: Diagnosis present

## 2017-11-21 DIAGNOSIS — I5032 Chronic diastolic (congestive) heart failure: Secondary | ICD-10-CM | POA: Diagnosis not present

## 2017-11-21 DIAGNOSIS — Z9981 Dependence on supplemental oxygen: Secondary | ICD-10-CM | POA: Diagnosis not present

## 2017-11-21 DIAGNOSIS — Z8673 Personal history of transient ischemic attack (TIA), and cerebral infarction without residual deficits: Secondary | ICD-10-CM | POA: Diagnosis not present

## 2017-11-21 DIAGNOSIS — R609 Edema, unspecified: Secondary | ICD-10-CM

## 2017-11-21 DIAGNOSIS — L98419 Non-pressure chronic ulcer of buttock with unspecified severity: Secondary | ICD-10-CM | POA: Diagnosis present

## 2017-11-21 DIAGNOSIS — M109 Gout, unspecified: Secondary | ICD-10-CM | POA: Diagnosis not present

## 2017-11-21 DIAGNOSIS — Z6841 Body Mass Index (BMI) 40.0 and over, adult: Secondary | ICD-10-CM | POA: Diagnosis not present

## 2017-11-21 DIAGNOSIS — I10 Essential (primary) hypertension: Secondary | ICD-10-CM

## 2017-11-21 DIAGNOSIS — L89302 Pressure ulcer of unspecified buttock, stage 2: Secondary | ICD-10-CM

## 2017-11-21 DIAGNOSIS — N400 Enlarged prostate without lower urinary tract symptoms: Secondary | ICD-10-CM | POA: Diagnosis not present

## 2017-11-21 DIAGNOSIS — R7989 Other specified abnormal findings of blood chemistry: Secondary | ICD-10-CM

## 2017-11-21 DIAGNOSIS — R4689 Other symptoms and signs involving appearance and behavior: Secondary | ICD-10-CM

## 2017-11-21 DIAGNOSIS — Z7982 Long term (current) use of aspirin: Secondary | ICD-10-CM | POA: Diagnosis not present

## 2017-11-21 DIAGNOSIS — J45909 Unspecified asthma, uncomplicated: Secondary | ICD-10-CM | POA: Diagnosis not present

## 2017-11-21 DIAGNOSIS — I110 Hypertensive heart disease with heart failure: Secondary | ICD-10-CM | POA: Diagnosis not present

## 2017-11-21 DIAGNOSIS — D696 Thrombocytopenia, unspecified: Secondary | ICD-10-CM | POA: Diagnosis not present

## 2017-11-21 DIAGNOSIS — L8990 Pressure ulcer of unspecified site, unspecified stage: Secondary | ICD-10-CM | POA: Diagnosis present

## 2017-11-21 DIAGNOSIS — R079 Chest pain, unspecified: Secondary | ICD-10-CM

## 2017-11-21 DIAGNOSIS — Z79899 Other long term (current) drug therapy: Secondary | ICD-10-CM

## 2017-11-21 DIAGNOSIS — J189 Pneumonia, unspecified organism: Secondary | ICD-10-CM | POA: Diagnosis not present

## 2017-11-21 LAB — BRAIN NATRIURETIC PEPTIDE: B Natriuretic Peptide: 222.3 pg/mL — ABNORMAL HIGH (ref 0.0–100.0)

## 2017-11-21 LAB — I-STAT TROPONIN, ED
TROPONIN I, POC: 0.03 ng/mL (ref 0.00–0.08)
TROPONIN I, POC: 0.01 ng/mL (ref 0.00–0.08)

## 2017-11-21 LAB — BASIC METABOLIC PANEL
ANION GAP: 13 (ref 5–15)
BUN: 25 mg/dL — ABNORMAL HIGH (ref 6–20)
CALCIUM: 8.9 mg/dL (ref 8.9–10.3)
CO2: 22 mmol/L (ref 22–32)
Chloride: 100 mmol/L — ABNORMAL LOW (ref 101–111)
Creatinine, Ser: 1.1 mg/dL (ref 0.61–1.24)
GLUCOSE: 128 mg/dL — AB (ref 65–99)
Potassium: 5.1 mmol/L (ref 3.5–5.1)
SODIUM: 135 mmol/L (ref 135–145)

## 2017-11-21 LAB — CBC
HCT: 40.1 % (ref 39.0–52.0)
Hemoglobin: 12.7 g/dL — ABNORMAL LOW (ref 13.0–17.0)
MCH: 29.3 pg (ref 26.0–34.0)
MCHC: 31.7 g/dL (ref 30.0–36.0)
MCV: 92.6 fL (ref 78.0–100.0)
PLATELETS: 159 10*3/uL (ref 150–400)
RBC: 4.33 MIL/uL (ref 4.22–5.81)
RDW: 13.3 % (ref 11.5–15.5)
WBC: 12.9 10*3/uL — AB (ref 4.0–10.5)

## 2017-11-21 LAB — D-DIMER, QUANTITATIVE: D-Dimer, Quant: 6.22 ug/mL-FEU — ABNORMAL HIGH (ref 0.00–0.50)

## 2017-11-21 MED ORDER — METOPROLOL TARTRATE 5 MG/5ML IV SOLN
5.0000 mg | Freq: Once | INTRAVENOUS | Status: AC
  Administered 2017-11-21: 5 mg via INTRAVENOUS
  Filled 2017-11-21: qty 5

## 2017-11-21 MED ORDER — MORPHINE SULFATE (PF) 4 MG/ML IV SOLN
4.0000 mg | Freq: Once | INTRAVENOUS | Status: AC
Start: 2017-11-21 — End: 2017-11-21
  Administered 2017-11-21: 4 mg via INTRAVENOUS
  Filled 2017-11-21: qty 1

## 2017-11-21 MED ORDER — SODIUM CHLORIDE 0.9 % IV SOLN
1.0000 g | Freq: Once | INTRAVENOUS | Status: AC
  Administered 2017-11-21: 1 g via INTRAVENOUS
  Filled 2017-11-21: qty 10

## 2017-11-21 MED ORDER — ENOXAPARIN SODIUM 150 MG/ML ~~LOC~~ SOLN
150.0000 mg | Freq: Two times a day (BID) | SUBCUTANEOUS | Status: DC
  Administered 2017-11-21: 150 mg via SUBCUTANEOUS
  Filled 2017-11-21: qty 1

## 2017-11-21 MED ORDER — IOPAMIDOL (ISOVUE-370) INJECTION 76%
100.0000 mL | Freq: Once | INTRAVENOUS | Status: AC | PRN
  Administered 2017-11-21: 100 mL via INTRAVENOUS

## 2017-11-21 MED ORDER — ASPIRIN 81 MG PO CHEW
324.0000 mg | CHEWABLE_TABLET | Freq: Once | ORAL | Status: DC
  Filled 2017-11-21: qty 4

## 2017-11-21 MED ORDER — SODIUM CHLORIDE 0.9 % IV SOLN
500.0000 mg | Freq: Once | INTRAVENOUS | Status: AC
  Administered 2017-11-21: 500 mg via INTRAVENOUS
  Filled 2017-11-21: qty 500

## 2017-11-21 MED ORDER — MORPHINE SULFATE (PF) 4 MG/ML IV SOLN
4.0000 mg | Freq: Once | INTRAVENOUS | Status: AC
  Administered 2017-11-21: 4 mg via INTRAVENOUS
  Filled 2017-11-21: qty 1

## 2017-11-21 NOTE — ED Provider Notes (Signed)
Angiocath insertion Performed by: Raeford Razor  Consent: Verbal consent obtained. Risks and benefits: risks, benefits and alternatives were discussed Time out: Immediately prior to procedure a "time out" was called to verify the correct patient, procedure, equipment, support staff and site/side marked as required.  Preparation: Patient was prepped and draped in the usual sterile fashion.  Vein Location: L AC  Ultrasound Guided  Gauge: 20 g  Normal blood return and flush without difficulty Patient tolerance: Patient tolerated the procedure well with no immediate complications.  CT w/o PE but possible pneumonia. Would explain pleuritic pain. Abx. Admit.       Raeford Razor, MD 11/21/17 Leonard Montoya

## 2017-11-21 NOTE — ED Notes (Signed)
Pt has returned from diagnostic studies.

## 2017-11-21 NOTE — ED Triage Notes (Addendum)
Pt in from home via [facility], pt reports L sided CP non radiating, denies n/v/d, denies SOB, 2/10 stabbing worse with deep inspirations, pt hx of a fib, pt  In a fib upon arrival to ED, declines IV in route, A&O x4, pt rcvd 324 mg ASA pta

## 2017-11-21 NOTE — ED Notes (Signed)
IV in left AC infiltrated,  IV removed and hot pack applied.

## 2017-11-21 NOTE — ED Notes (Signed)
Patient transported to Ultrasound/VASCULAR AND XRAY

## 2017-11-21 NOTE — ED Notes (Signed)
CT made aware of pt being ready for CT

## 2017-11-21 NOTE — ED Notes (Signed)
Pt given Coke to drink.  Daughter remains at bedside

## 2017-11-21 NOTE — ED Notes (Signed)
Placed 2L nasal cannula for comfort.

## 2017-11-21 NOTE — Progress Notes (Signed)
Left lower extremity venous duplex has been completed. Negative for obvious evidence of DVT. Results were given to Dr. Lynelle Doctor.  11/21/17 12:42 PM Olen Cordial RVT

## 2017-11-21 NOTE — ED Notes (Signed)
Phlebotomy at bedside.

## 2017-11-21 NOTE — ED Notes (Signed)
PAGED ADMITTING PER RN  

## 2017-11-21 NOTE — ED Notes (Signed)
ED Provider at bedside. 

## 2017-11-21 NOTE — H&P (Addendum)
History and Physical    Leonard Montoya:[id number] DOB: 1941/01/28 DOA: 11/21/2017  PCP: Leonard Brooks, MD  Patient coming from: home   Chief Complaint: chest pain  HPI: Leonard Montoya is a 77 y.o. male with medical history significant for bed-bound status, oxygen dependent (but doesn't use frequently), chronic a-fib (refuses anticoagulation, doesn't take beta blocker), morbid obesity, urinary incontinence (chronic condom cath), asthma, CVA, hearing loss, thrombocytopenia, who presents with chest pain.  Has been going on for 3-4 days. Happened at least once before, a while ago. No recent trauma or heavy lifting. Is constant and comes and goes in intensity. Left chest, does not radiate. Worse with big breaths. Not worse with movement, not relieved by sitting forward. Baseline shortness of breath is unchanged. No significant recent cough. No abdominal pain or n/v. Has chronic intermittent diarrhea. No personal or fam hx of venous thrombus. Denies wheezing. Nothing seems to relieve. Not getting better, not getting worse.  Denies dysuria/hematuria or suprapubic pain but is concerned about "turbid" urine. Uses chronic condom cath.  ED Course: labs, imaging, antibiotics, lovenox  Review of Systems: As per HPI otherwise 10 point review of systems negative.    Past Medical History:  Diagnosis Date  . Allergy 1978   rag weed, dust, animal dander, pollen  . Arthritis   . Asthma   . Atrial fibrillation (HCC)   . BPH (benign prostatic hypertrophy)   . Chronic insomnia   . Gout   . Hyperlipidemia   . Hypertension   . Obesity 1974    No past surgical history on file.   reports that he quit smoking about 26 years ago. He has never used smokeless tobacco. He reports that he does not drink alcohol or use drugs.  Allergies  Allergen Reactions  . Sudafed [Pseudoephedrine Hcl] Other (See Comments)    Weird Dreams    Family History  Problem Relation Age of Onset  . Arthritis Mother   .  Alcohol abuse Father   . Arthritis Father   . Diabetes Father   . Drug abuse Brother   . Learning disabilities Brother     Prior to Admission medications   Medication Sig Start Date End Date Taking? Authorizing Provider  acetaminophen (TYLENOL) 325 MG tablet Take 650 mg by mouth every 6 (six) hours as needed for mild pain.   Yes [provider]  diphenhydrAMINE (BENADRYL) 25 MG tablet Take 25 mg by mouth daily as needed for allergies.   Yes [provider]  furosemide (LASIX) 40 MG tablet Take 80 mg by mouth daily.    Yes [provider]  loperamide (IMODIUM) 2 MG capsule Take 2 mg by mouth as needed for diarrhea or loose stools. Geri-care   Yes [provider]  QUEtiapine (SEROQUEL) 50 MG tablet Take 1 tablet (50 mg total) by mouth at bedtime. Patient taking differently: Take 50 mg by mouth daily.  07/08/16  Yes Leonard Brooks, MD  albuterol (PROVENTIL HFA;VENTOLIN HFA) 108 (90 Base) MCG/ACT inhaler Inhale 1-2 puffs into the lungs every 6 (six) hours as needed for wheezing. Patient not taking: Reported on 07/04/2016 09/12/15   Leonard Porter, MD  aspirin 325 MG tablet Take 1 tablet (325 mg total) by mouth daily. Patient not taking: Reported on 11/21/2017 02/03/17   Leonard Gilding, MD  atorvastatin (LIPITOR) 20 MG tablet Take 1 tablet (20 mg total) by mouth daily at 6 PM. Patient not taking: Reported on 11/21/2017 02/02/17   Leonard Montoya,  Leonard Katayama, MD  levofloxacin (LEVAQUIN) 500 MG tablet Take 1 tablet (500 mg total) by mouth daily. Patient not taking: Reported on 11/21/2017 02/03/17   Leonard Gilding, MD  LORazepam (ATIVAN) 2 MG/ML concentrated solution Take 0.5 mLs (1 mg total) by mouth every 4 (four) hours as needed for anxiety. Patient not taking: Reported on 07/04/2016 10/25/15   Leonard Overlie, MD  metoprolol tartrate (LOPRESSOR) 25 MG tablet Take 1 tablet (25 mg total) by mouth 2 (two) times daily. Patient not taking: Reported on 11/21/2017 02/02/17   Leonard Gilding, MD  morphine (ROXANOL) 20 MG/ML concentrated solution Take 0.25 mLs (5 mg total) by mouth every 2 (two) hours as needed for severe pain. Patient not taking: Reported on 07/04/2016 10/25/15   Leonard Overlie, MD    Physical Exam: Vitals:   11/21/17 1715 11/21/17 1730 11/21/17 1745 11/21/17 1800  BP: 140/83 (!) 154/131 131/78 114/71  Pulse:   (!) 53 (!) 48  Resp: SpO2:   99% 100%  Weight:        Constitutional: complaining of left chest pain. dissheveled Head: Atraumatic Eyes: Conjunctiva clear ENM: Moist mucous membranes. Poor dentition.  Neck: Supple Respiratory: poor effort. No wheeze. Decreased sounds at bases. Rales at bases. Cardiovascular: tachycardic, irretular irregular rhythm. Distant heart sounds. Mild systolic murmur. No ttp. No rash Abdomen: Non-tender, obese, moderately distended. No masses. No rebound or guarding. Positive bowel sounds. Musculoskeletal: No joint deformity upper and lower extremities. Normal shoulder rom. Skin: modeate pitting edema to mid calf. Hyperpigmentation lower extremities. Stage 2 sacral decub Extremities:Palpable peripheral pulses. Warm Neurologic: Alert, moving all 4 extremities. Psychiatric: Normal insight and judgement.   Labs on Admission: I have personally reviewed following labs and imaging studies  CBC: Recent Labs  Lab 11/21/17 1141  WBC 12.9*  HGB 12.7*  HCT 40.1  MCV 92.6  PLT 159   Basic Metabolic Panel: Recent Labs  Lab 11/21/17 1141  NA 135  K 5.1  CL 100*  CO2 22  GLUCOSE 128*  BUN 25*  CREATININE 1.10  CALCIUM 8.9   GFR: CrCl cannot be calculated (Unknown ideal weight.). Liver Function Tests: No results for input(s): AST, ALT, ALKPHOS, BILITOT, PROT, ALBUMIN in the last 168 hours. No results for input(s): LIPASE, AMYLASE in the last 168 hours. No results for input(s): AMMONIA in the last 168 hours. Coagulation Profile: No results for input(s): INR, PROTIME in the last 168  hours. Cardiac Enzymes: No results for input(s): CKTOTAL, CKMB, CKMBINDEX, TROPONINI in the last 168 hours. BNP (last 3 results) No results for input(s): PROBNP in the last 8760 hours. HbA1C: No results for input(s): HGBA1C in the last 72 hours. CBG: No results for input(s): GLUCAP in the last 168 hours. Lipid Profile: No results for input(s): CHOL, HDL, LDLCALC, TRIG, CHOLHDL, LDLDIRECT in the last 72 hours. Thyroid Function Tests: No results for input(s): TSH, T4TOTAL, FREET4, T3FREE, THYROIDAB in the last 72 hours. Anemia Panel: No results for input(s): VITAMINB12, FOLATE, FERRITIN, TIBC, IRON, RETICCTPCT in the last 72 hours. Urine analysis:    Component Value Date/Time   COLORURINE YELLOW 01/30/2017 1356   APPEARANCEUR CLOUDY (A) 01/30/2017 1356   LABSPEC 1.020 01/30/2017 1356   PHURINE 6.5 01/30/2017 1356   GLUCOSEU NEGATIVE 01/30/2017 1356   HGBUR LARGE (A) 01/30/2017 1356   BILIRUBINUR NEGATIVE 01/30/2017 1356   KETONESUR NEGATIVE 01/30/2017 1356   PROTEINUR >300 (A) 01/30/2017 1356   NITRITE POSITIVE (A) 01/30/2017 1356  LEUKOCYTESUR SMALL (A) 01/30/2017 1356    Radiological Exams on Admission: Ct Angio Chest Pe W And/or Wo Contrast  Result Date: 11/21/2017 CLINICAL DATA:  Left-sided chest pain. PE suspected. Intermediate probability. Positive D-dimer. EXAM: CT ANGIOGRAPHY CHEST WITH CONTRAST TECHNIQUE: Multidetector CT imaging of the chest was performed using the standard protocol during bolus administration of intravenous contrast. Multiplanar CT image reconstructions and MIPs were obtained to evaluate the vascular anatomy. CONTRAST:  ISOVUE-370 IOPAMIDOL (ISOVUE-370) INJECTION 76% COMPARISON:  One-view chest x-ray from the same day. FINDINGS: Cardiovascular: The heart is enlarged. Coronary artery calcifications are present. Minimal pericardial fluid is present. Pulmonary arterial opacification is excellent. There is some artifact from patient breathing motion and  left arm positioning. Patient was unable to place is arm above his head. No focal filling defects are present. Mediastinum/Nodes: No significant mediastinal, axillary, or hilar adenopathy is present. The esophagus is within normal limits. Lungs/Pleura: Right pleural effusion is present. Posterior right middle and lower lobe airspace disease is noted. No definite mass lesion is present. Small left pleural effusion is present. There is mild left sided atelectasis. Upper Abdomen: Gallstones are present. No inflammatory changes are present about the gallbladder. There is a nodular appearance to the liver. No discrete lesions are present. Musculoskeletal: Fused anterior osteophytes are present. Vertebral body heights are maintained. No focal lytic or blastic lesions are present. Review of the MIP images confirms the above findings. IMPRESSION: 1. Right lower lobe and posterior right middle lobe airspace disease is most concerning for lobar pneumonia. Underlying mass lesion is not discretely visualized. Recommend follow-up CT the chest without contrast 2-3 weeks following appropriate therapy. 2. Right greater than left pleural effusions. 3. Cardiomegaly without failure. 4. Cholelithiasis without cholecystitis. 5. Nodular appearance of the liver suggesting cirrhosis. Electronically Signed   By: Marin Roberts M.D.   On: 11/21/2017 18:08   Dg Chest Portable 1 View  Result Date: 11/21/2017 CLINICAL DATA:  Shortness of breath.  CHF. EXAM: PORTABLE CHEST 1 VIEW COMPARISON:  01/30/2017. FINDINGS: Cardiomegaly with pulmonary interstitial prominence particularly on the right. Bilateral pleural effusions, right side greater than left. These findings suggest congestive heart failure. No pneumothorax. No acute bony abnormality. IMPRESSION: Findings consistent with congestive heart failure and pulmonary interstitial edema and pleural effusions. Electronically Signed   By: Maisie Fus  Register   On: 11/21/2017 12:21    EKG:  Independently reviewed. A fib, no sig st elevation/depression or t w inversions  Assessment/Plan Active Problems:   Essential hypertension, benign   Atrial fibrillation (HCC)   Morbid obesity (HCC)   Asthma, chronic   Thrombocytopenia (HCC)   Community acquired pneumonia   Pressure ulcer   Hypoxemia   # Community acquired pneumonia - The etiology of pt's chest pain is unclear. W/u thus far has CT showing RLL infiltrate suggestive of pneumonia, and pt does have pleuritic chest pain and elevated WBCs. CTA negative for PE, aortic dissection. No signs pericarditis on CT or EKG. Troponins negative, which is particularly reassuring given pt's symtpoms present now for 3-4 days. No sig wheeze to suggest asthma exacerbation. No signs pneumothorax. Not ttp so less likely msk. No signs rib fx on CT. No signs pleuritis on CT. Says needs oxygen and has it prescribed but only uses sometimes. Unsure what his baseline o2 requirement is. bnp mildly elevated and has hx dCHF, but does not appear to be with significant volume overload. Effusion also noted, radiology says cannot exclude mass. - ceftriaxone/azithromycin - [place] O2 - trend troponins -  tele overnight, EKG in AM - continue home lasix - outpt CT f/u  # A-fib - refuses anticoagulation, not compliant w/ bb. Pulse rate 110s - resume metoprolol - tele overnight  # Cirrhosis - CT suggestive, platelets low normal. Denies heavy drinking  # Hypoxia - [place] as above  # Abnormal urine - pt very concerned about kidney infection. Denies symptoms, though. - f/u culture - ceftriaxone as above  # stage 2 decubitus ulcer - wound consult  # aggressive behavior - continue home seroquel - hiv, hep c, peripheral smear  DVT prophylaxis: lovenox, SCDs Code Status: dnr, confirmed w/ pt and family3  Family Communication: daughter Forbes Cellar [phone]  Disposition Plan: tbd  Consults called: none  Admission status: tele med surg    Silvano Bilis  MD [facility] Pager [phone]  If 7PM-7AM, please contact night-coverage [email or web address] Password [email or web address]  11/21/2017, 7:52 PM

## 2017-11-21 NOTE — ED Notes (Signed)
CT transported came to get pt for CT, pt refused to go until he received more pain med.

## 2017-11-21 NOTE — Progress Notes (Addendum)
ANTICOAGULATION CONSULT NOTE - Initial Consult  Pharmacy Consult for Lovenox Indication: pulmonary embolus  Patient Measurements: Weight: (!) 340 lb (154.2 kg)(per patient)  Vital Signs: BP: 112/102 (05/03 1500) Pulse Rate: 42 (05/03 1500)  Labs: Recent Labs    11/21/17 1141  HGB 12.7*  HCT 40.1  PLT 159  CREATININE 1.10   CrCl cannot be calculated (Unknown ideal weight.).  Assessment: 41 yoM presenting with chest pain. Pharmacy consulted to dose Lovenox for PE treatment. No AC PTA. Scr 1.1, appears near baseline. Estimated CrCl > 100 mL/min. Hgb 12.7, pltc WNL. No bleeding noted.  Goal of Therapy:  Monitor platelets by anticoagulation protocol: Yes   Plan:  Lovenox  SQ q12h Monitor s/sx of bleeding  Erin N. Zigmund Daniel, PharmD PGY1 Pharmacy Resident Pager: [phone] 11/21/2017,3:45 PM

## 2017-11-21 NOTE — ED Notes (Signed)
Admitting MD in to assess pt for admission 

## 2017-11-21 NOTE — ED Provider Notes (Signed)
[facility] EMERGENCY DEPARTMENT Provider Note   CSN: [id number] Arrival date & time: 11/21/17  1123     History   Chief Complaint Chest pain  HPI EGON DITTUS is a 77 y.o. male.  HPI Pt has been having chest pain for the last 3 days.  The pain is sharp in the chest.  Certain movements and deep breathing increases the pain.  No known fevers but felt warm.  No coughing.  NO history of PE or heart disease.  Pt has also been dealing with a possible uti.  Caregivers are concerned about urine output.  Pt is treated by palliative care.  He has a caregiver.  He lives at home. Pt is bedridden Past Medical History:  Diagnosis Date  . Allergy 1978   rag weed, dust, animal dander, pollen  . Arthritis   . Asthma   . Atrial fibrillation (HCC)   . BPH (benign prostatic hypertrophy)   . Chronic insomnia   . Gout   . Hyperlipidemia   . Hypertension   . Obesity 1974    Patient Active Problem List   Diagnosis Date Noted  . UTI (urinary tract infection) 01/31/2017  . CVA (cerebral vascular accident) (HCC) 01/31/2017  . Right sided weakness 01/30/2017  . Shortness of breath   . Encounter for palliative care   . Goals of care, counseling/discussion   . Pressure ulcer 10/23/2015  . Acute congestive heart failure (HCC)   . Atrial fibrillation with rapid ventricular response (HCC) 10/21/2015  . Sepsis (HCC) 10/21/2015  . AKI (acute kidney injury) (HCC) 10/21/2015  . Acute CHF (congestive heart failure) (HCC) 10/21/2015  . Thrombocytopenia (HCC) 10/21/2015  . CAP (community acquired pneumonia) 10/21/2015  . Gait instability 10/18/2013  . At high risk for falls 09/28/2013  . Chronic insomnia 09/28/2013  . Essential hypertension, benign 09/27/2013  . Atrial fibrillation (HCC) 09/27/2013  . Morbid obesity (HCC) 09/27/2013  . Omphalitis in adult 09/27/2013  . Asthma, chronic 09/27/2013  . Venous stasis dermatitis 09/27/2013  . Other and unspecified hyperlipidemia  09/27/2013    No past surgical history on file.      Home Medications    Prior to Admission medications   Medication Sig Start Date End Date Taking? Authorizing Provider  acetaminophen (TYLENOL) 325 MG tablet Take 650 mg by mouth every 6 (six) hours as needed for mild pain.    [provider]  albuterol (PROVENTIL HFA;VENTOLIN HFA) 108 (90 Base) MCG/ACT inhaler Inhale 1-2 puffs into the lungs every 6 (six) hours as needed for wheezing. Patient not taking: Reported on 07/04/2016 09/12/15   Rolland Porter, MD  aspirin 325 MG tablet Take 1 tablet (325 mg total) by mouth daily. 02/03/17   Leatha Gilding, MD  atorvastatin (LIPITOR) 20 MG tablet Take 1 tablet (20 mg total) by mouth daily at 6 PM. 02/02/17   Gherghe, Daylene Katayama, MD  diphenhydrAMINE (BENADRYL) 25 MG tablet Take 25 mg by mouth daily as needed for allergies.    [provider]  furosemide (LASIX) 40 MG tablet Take 40 mg by mouth daily.    [provider]  levofloxacin (LEVAQUIN) 500 MG tablet Take 1 tablet (500 mg total) by mouth daily. 02/03/17   Leatha Gilding, MD  LORazepam (ATIVAN) 2 MG/ML concentrated solution Take 0.5 mLs (1 mg total) by mouth every 4 (four) hours as needed for anxiety. Patient not taking: Reported on 07/04/2016 10/25/15   Richarda Overlie, MD  metoprolol tartrate (LOPRESSOR) 25  MG tablet Take 1 tablet (25 mg total) by mouth 2 (two) times daily. 02/02/17   Leatha Gilding, MD  morphine (ROXANOL) 20 MG/ML concentrated solution Take 0.25 mLs (5 mg total) by mouth every 2 (two) hours as needed for severe pain. Patient not taking: Reported on 07/04/2016 10/25/15   Richarda Overlie, MD  QUEtiapine (SEROQUEL) 50 MG tablet Take 1 tablet (50 mg total) by mouth at bedtime. Patient taking differently: Take 50 mg by mouth 2 (two) times daily.  07/08/16   Donita Brooks, MD    Family History Family History  Problem Relation Age of Onset  . Arthritis Mother   . Alcohol abuse Father   . Arthritis  Father   . Diabetes Father   . Drug abuse Brother   . Learning disabilities Brother     Social History Social History   Tobacco Use  . Smoking status: Former Smoker    Last attempt to quit: 07/23/1991    Years since quitting: 26.3  . Smokeless tobacco: Never Used  Substance Use Topics  . Alcohol use: No  . Drug use: No     Allergies   Sudafed [pseudoephedrine hcl]   Review of Systems Review of Systems   Physical Exam Updated Vital Signs BP (!) 112/102   Pulse (!) 42   Resp 17   Wt (!) 154.2 kg (340 lb) Comment: per patient  SpO2 98%   BMI 43.65 kg/m   Physical Exam  Constitutional: No distress.  Morbidly obese  HENT:  Head: Normocephalic and atraumatic.  Right Ear: External ear normal.  Left Ear: External ear normal.  Eyes: Conjunctivae are normal. Right eye exhibits no discharge. Left eye exhibits no discharge. No scleral icterus.  Neck: Neck supple. No tracheal deviation present.  Cardiovascular: Normal rate, regular rhythm and intact distal pulses.  Pulmonary/Chest: Effort normal. No stridor. No respiratory distress. He has no wheezes. He has rales.  Abdominal: Soft. Bowel sounds are normal. He exhibits no distension. There is no tenderness. There is no rebound and no guarding.  Genitourinary:  Genitourinary Comments: Foley in place  Musculoskeletal: He exhibits edema ( left greater than right). He exhibits no tenderness.  Neurological: He is alert. No cranial nerve deficit (no facial droop, extraocular movements intact, no slurred speech) or sensory deficit. He exhibits normal muscle tone. He displays no seizure activity. Coordination normal.  Generalized weakness, difficulty moving right arm  Skin: Skin is warm and dry. No rash noted. He is not diaphoretic.  Psychiatric: He has a normal mood and affect.  Nursing note and vitals reviewed.    ED Treatments / Results  Labs (all labs ordered are listed, but only abnormal results are displayed) Labs  Reviewed  BASIC METABOLIC PANEL - Abnormal; Notable for the following components:      Result Value   Chloride 100 (*)    Glucose, Bld 128 (*)    BUN 25 (*)    All other components within normal limits  CBC - Abnormal; Notable for the following components:   WBC 12.9 (*)    Hemoglobin 12.7 (*)    All other components within normal limits  BRAIN NATRIURETIC PEPTIDE - Abnormal; Notable for the following components:   B Natriuretic Peptide 222.3 (*)    All other components within normal limits  D-DIMER, QUANTITATIVE (NOT AT [facility]) - Abnormal; Notable for the following components:   D-Dimer, Quant 6.22 (*)    All other components within normal limits  URINALYSIS, ROUTINE W REFLEX  MICROSCOPIC  I-STAT TROPONIN, ED  I-STAT TROPONIN, ED    EKG Rate 78 Atrial fibrillation Left axis deviation with borderline T wave abnormalities that appear new since prior EKG   Radiology Dg Chest Portable 1 View  Result Date: 11/21/2017 CLINICAL DATA:  Shortness of breath.  CHF. EXAM: PORTABLE CHEST 1 VIEW COMPARISON:  01/30/2017. FINDINGS: Cardiomegaly with pulmonary interstitial prominence particularly on the right. Bilateral pleural effusions, right side greater than left. These findings suggest congestive heart failure. No pneumothorax. No acute bony abnormality. IMPRESSION: Findings consistent with congestive heart failure and pulmonary interstitial edema and pleural effusions. Electronically Signed   By: Maisie Fus  Register   On: 11/21/2017 12:21    Procedures Procedures (including critical care time)  Medications Ordered in ED Medications  aspirin chewable tablet 324 mg (324 mg Oral Not Given 11/21/17 1318)  morphine 4 MG/ML injection 4 mg (has no administration in time range)  enoxaparin (LOVENOX) injection 150 mg (has no administration in time range)  morphine 4 MG/ML injection 4 mg (4 mg Intravenous Given 11/21/17 1359)     Initial Impression / Assessment and Plan / ED Course  I have reviewed  the triage vital signs and the nursing notes.  Pertinent labs & imaging results that were available during my care of the patient were reviewed by me and considered in my medical decision making (see chart for details).  Clinical Course as of Nov 22 1555  Fri Nov 21, 2017  1344 Pt is complaining of pain   [JK]  1541 CXR suggests chf.  Bnp is slightly elevated.  Trop negative but d dimer is significantly elevated.  Prelim dvt report is negative.   Will ct angio to evaluate for PE.   [JK]    Clinical Course User Index [JK] Linwood Dibbles, MD   Pt with chest pain, overall poor health.  Pleuritic chest pain concerning for PE with positive d dimer.  Will ct angio to evaluate further.  Case turned over to Dr Juleen China.  Final Clinical Impressions(s) / ED Diagnoses   Final diagnoses:  Chest pain, unspecified type  Positive D dimer      Linwood Dibbles, MD 11/21/17 [phone]

## 2017-11-21 NOTE — ED Notes (Signed)
Pt continues to turn call light on stating that he needs something for pain.  Advised pt that he had just recently been given Morphine   Pt st's I need something stronger.  Advised pt that I would relay message to MD.  Pt voices understanding

## 2017-11-21 NOTE — ED Notes (Signed)
Pts daughter at bedside  

## 2017-11-22 ENCOUNTER — Other Ambulatory Visit: Payer: Self-pay

## 2017-11-22 ENCOUNTER — Encounter (HOSPITAL_COMMUNITY): Payer: Self-pay | Admitting: Nurse Practitioner

## 2017-11-22 DIAGNOSIS — R4689 Other symptoms and signs involving appearance and behavior: Secondary | ICD-10-CM

## 2017-11-22 DIAGNOSIS — J189 Pneumonia, unspecified organism: Secondary | ICD-10-CM | POA: Diagnosis not present

## 2017-11-22 DIAGNOSIS — R0902 Hypoxemia: Secondary | ICD-10-CM | POA: Diagnosis not present

## 2017-11-22 DIAGNOSIS — R079 Chest pain, unspecified: Secondary | ICD-10-CM

## 2017-11-22 DIAGNOSIS — I481 Persistent atrial fibrillation: Secondary | ICD-10-CM | POA: Diagnosis not present

## 2017-11-22 DIAGNOSIS — R7989 Other specified abnormal findings of blood chemistry: Secondary | ICD-10-CM | POA: Diagnosis not present

## 2017-11-22 LAB — URINALYSIS, ROUTINE W REFLEX MICROSCOPIC
BILIRUBIN URINE: NEGATIVE
Glucose, UA: NEGATIVE mg/dL
Ketones, ur: NEGATIVE mg/dL
Nitrite: NEGATIVE
Protein, ur: 100 mg/dL — AB
SPECIFIC GRAVITY, URINE: 1.026 (ref 1.005–1.030)
WBC, UA: >50 WBC/hpf — ABNORMAL HIGH (ref 0–5)
pH: 9 — ABNORMAL HIGH (ref 5.0–8.0)

## 2017-11-22 LAB — BASIC METABOLIC PANEL
Anion gap: 13 (ref 5–15)
BUN: 25 mg/dL — AB (ref 6–20)
CHLORIDE: 100 mmol/L — AB (ref 101–111)
CO2: 22 mmol/L (ref 22–32)
CREATININE: 1.17 mg/dL (ref 0.61–1.24)
Calcium: 8.8 mg/dL — ABNORMAL LOW (ref 8.9–10.3)
GFR calc Af Amer: >60 mL/min (ref >60)
GFR calc non Af Amer: 58 mL/min — ABNORMAL LOW (ref >60)
Glucose, Bld: 128 mg/dL — ABNORMAL HIGH (ref 65–99)
Potassium: 4.8 mmol/L (ref 3.5–5.1)
Sodium: 135 mmol/L (ref 135–145)

## 2017-11-22 LAB — CBC
HCT: 40.4 % (ref 39.0–52.0)
Hemoglobin: 12.8 g/dL — ABNORMAL LOW (ref 13.0–17.0)
MCH: 29.4 pg (ref 26.0–34.0)
MCHC: 31.7 g/dL (ref 30.0–36.0)
MCV: 92.9 fL (ref 78.0–100.0)
PLATELETS: 164 10*3/uL (ref 150–400)
RBC: 4.35 MIL/uL (ref 4.22–5.81)
RDW: 13.3 % (ref 11.5–15.5)
WBC: 14.4 10*3/uL — ABNORMAL HIGH (ref 4.0–10.5)

## 2017-11-22 LAB — SAVE SMEAR

## 2017-11-22 LAB — CREATININE, SERUM
CREATININE: 1.17 mg/dL (ref 0.61–1.24)
GFR calc Af Amer: >60 mL/min (ref >60)
GFR calc non Af Amer: 58 mL/min — ABNORMAL LOW (ref >60)

## 2017-11-22 LAB — HEMOGLOBIN A1C
Hgb A1c MFr Bld: 5.5 % (ref 4.8–5.6)
Mean Plasma Glucose: 111.15 mg/dL

## 2017-11-22 LAB — HIV ANTIBODY (ROUTINE TESTING W REFLEX): HIV SCREEN 4TH GENERATION: NONREACTIVE

## 2017-11-22 LAB — TROPONIN I
TROPONIN I: 0.04 ng/mL — AB (ref <0.03)
TROPONIN I: 0.03 ng/mL — AB (ref <0.03)

## 2017-11-22 MED ORDER — SODIUM CHLORIDE 0.9 % IV SOLN
1.0000 g | INTRAVENOUS | Status: DC
  Administered 2017-11-22 – 2017-11-23 (×2): 1 g via INTRAVENOUS
  Filled 2017-11-22 (×3): qty 10

## 2017-11-22 MED ORDER — ENOXAPARIN SODIUM 40 MG/0.4ML ~~LOC~~ SOLN: 40.0000 mg | SUBCUTANEOUS | Status: DC

## 2017-11-22 MED ORDER — QUETIAPINE FUMARATE 25 MG PO TABS
50.0000 mg | ORAL_TABLET | Freq: Every day | ORAL | Status: DC
  Administered 2017-11-23 (×2): 50 mg via ORAL
  Filled 2017-11-22 (×2): qty 2

## 2017-11-22 MED ORDER — ENOXAPARIN SODIUM 80 MG/0.8ML ~~LOC~~ SOLN
70.0000 mg | SUBCUTANEOUS | Status: DC
  Filled 2017-11-22 (×2): qty 0.8

## 2017-11-22 MED ORDER — SODIUM CHLORIDE 0.9 % IV SOLN: 250.0000 mL | INTRAVENOUS | Status: DC | PRN

## 2017-11-22 MED ORDER — LOPERAMIDE HCL 2 MG PO CAPS: 2.0000 mg | ORAL_CAPSULE | ORAL | Status: DC | PRN

## 2017-11-22 MED ORDER — FUROSEMIDE 80 MG PO TABS
80.0000 mg | ORAL_TABLET | Freq: Every day | ORAL | Status: DC
  Administered 2017-11-22 – 2017-11-24 (×3): 80 mg via ORAL
  Filled 2017-11-22 (×3): qty 1

## 2017-11-22 MED ORDER — METOPROLOL TARTRATE 25 MG PO TABS
25.0000 mg | ORAL_TABLET | Freq: Two times a day (BID) | ORAL | Status: DC
  Administered 2017-11-22 – 2017-11-24 (×5): 25 mg via ORAL
  Filled 2017-11-22 (×5): qty 1

## 2017-11-22 MED ORDER — SODIUM CHLORIDE 0.9% FLUSH
3.0000 mL | Freq: Two times a day (BID) | INTRAVENOUS | Status: DC
  Administered 2017-11-22 – 2017-11-24 (×4): 3 mL via INTRAVENOUS

## 2017-11-22 MED ORDER — SODIUM CHLORIDE 0.9% FLUSH: 3.0000 mL | INTRAVENOUS | Status: DC | PRN

## 2017-11-22 MED ORDER — SODIUM CHLORIDE 0.9 % IV SOLN
500.0000 mg | INTRAVENOUS | Status: DC
  Administered 2017-11-22: 500 mg via INTRAVENOUS
  Filled 2017-11-22 (×2): qty 500

## 2017-11-22 NOTE — Progress Notes (Signed)
PROGRESS NOTE    SHANAN MCMILLER  ONG:[id number] DOB: April 24, 1941 DOA: 11/21/2017 PCP: Donita Brooks, MD   Brief Narrative:77 y.o. male with medical history significant for bed-bound status, oxygen dependent (but doesn't use frequently), chronic a-fib (refuses anticoagulation, doesn't take beta blocker), morbid obesity, urinary incontinence (chronic condom cath), asthma, CVA, hearing loss, thrombocytopenia, who presents with chest pain.  Has been going on for 3-4 days. Happened at least once before, a while ago. No recent trauma or heavy lifting. Is constant and comes and goes in intensity. Left chest, does not radiate. Worse with big breaths. Not worse with movement, not relieved by sitting forward. Baseline shortness of breath is unchanged. No significant recent cough. No abdominal pain or n/v. Has chronic intermittent diarrhea. No personal or fam hx of venous thrombus. Denies wheezing. Nothing seems to relieve. Not getting better, not getting worse.  Denies dysuria/hematuria or suprapubic pain but is concerned about "turbid" urine. Uses chronic condom cath.  11/22/2017 patient up in bed trying to eat breakfast this morning.  His breakfast tray is far from him and he is trying to reach that and eat it.  When I was talking to him he kept telling me that he cannot hear me he cannot hear me but as I move the breakfast tray closer to him and asked him he said good? he said okay  yes that is okay. seems to me that he can hear certain things and he cannot hear other things.  He denies shortness of breath.  Assessment & Plan:   Active Problems:   Essential hypertension, benign   Atrial fibrillation (HCC)   Morbid obesity (HCC)   Asthma, chronic   Thrombocytopenia (HCC)   Community acquired pneumonia   Pressure ulcer   Hypoxemia   Aggressive behavior of adult  1] community-acquired pneumonia-patient admitted with pleuritic chest pain and shortness of breath and cough.  He was found to have  elevated WBCs CT of the chest showed no evidence of pulmonary embolism or aortic dissection.  Right lower lobe and posterior right middle lobe airspace disease concerning for lobar pneumonia noted.  Concern for underlying mass lesion.  Recommend repeat CT in 2 to 3 weeks without contrast.  Right greater than left pleural effusion.  Cardiomegaly.  There are appearance of the liver suggesting cirrhosis.  Patient started on Rocephin and azithromycin.  2] atrial fibrillation heart rate anywhere from 59-91.  On metoprolol.  25 mg daily.  Monitor. 3] stage II decubitus ulcer found prior to admission.  Wound consult pending.  4] history of diastolic heart failure on 80 mg Lasix at home which will be continued.  5] history of hyperlipidemia continue Lipitor.  6] aggressive behavior?  On Seroquel at home which is continued.    DVT prophylaxis: Lovenox and SCD. Code Status: DO NOT RESUSCITATE family Communication: No family available Disposition Plan:  TBD  Consultants: None Procedures: None Antimicrobials: Rocephin and azithromycin  Subjective: No complaints eating breakfast.  Objective: Vitals:   11/22/17 0015 11/22/17 0030 11/22/17 0118 11/22/17 0520  BP: 121/76 125/68 (!) 147/79 (!) 129/91  Pulse: 91 (!) 48 96 (!) 50  Resp: Temp:   97.7 F (36.5 C) 97.8 F (36.6 C)  TempSrc:   Oral Oral  SpO2: 97% 98% 94% 97%  Weight:   (!) 142.4 kg (314 lb)     Intake/Output Summary (Last 24 hours) at 11/22/2017 0855 Last data filed at 11/22/2017 0559 Gross per 24 hour  Intake 220 ml  Output -  Net 220 ml   Filed Weights   11/21/17 1500 11/22/17 0118  Weight: (!) 154.2 kg (340 lb) (!) 142.4 kg (314 lb)    Examination:  General exam: Appears calm and comfortable  Respiratory system: Clear to auscultation. Respiratory effort normal. Cardiovascular system: S1 & S2 heard, RRR. No JVD, murmurs, rubs, gallops or clicks. No pedal edema. Gastrointestinal system: Abdomen is  nondistended, soft and nontender. No organomegaly or masses felt. Normal bowel sounds heard. Central nervous system: Alert and oriented. No focal neurological deficits. Extremities: Symmetric 5 x 5 power. Skin: No rashes, lesions or ulcers Psychiatry: Judgement and insight appear normal. Mood & affect appropriate.     Data Reviewed: I have personally reviewed following labs and imaging studies  CBC: Recent Labs  Lab 11/21/17 1141 11/22/17 0246  WBC 12.9* 14.4*  HGB 12.7* 12.8*  HCT 40.1 40.4  MCV 92.6 92.9  PLT 159 164   Basic Metabolic Panel: Recent Labs  Lab 11/21/17 1141 11/22/17 0246 11/22/17 0247  NA 135  --  135  K 5.1  --  4.8  CL 100*  --  100*  CO2 22  --  22  GLUCOSE 128*  --  128*  BUN 25*  --  25*  CREATININE 1.10 1.17 1.17  CALCIUM 8.9  --  8.8*   GFR: CrCl cannot be calculated (Unknown ideal weight.). Liver Function Tests: No results for input(s): AST, ALT, ALKPHOS, BILITOT, PROT, ALBUMIN in the last 168 hours. No results for input(s): LIPASE, AMYLASE in the last 168 hours. No results for input(s): AMMONIA in the last 168 hours. Coagulation Profile: No results for input(s): INR, PROTIME in the last 168 hours. Cardiac Enzymes: Recent Labs  Lab 11/22/17 0246  TROPONINI 0.04*   BNP (last 3 results) No results for input(s): PROBNP in the last 8760 hours. HbA1C: Recent Labs    11/22/17 0247  HGBA1C 5.5   CBG: No results for input(s): GLUCAP in the last 168 hours. Lipid Profile: No results for input(s): CHOL, HDL, LDLCALC, TRIG, CHOLHDL, LDLDIRECT in the last 72 hours. Thyroid Function Tests: No results for input(s): TSH, T4TOTAL, FREET4, T3FREE, THYROIDAB in the last 72 hours. Anemia Panel: No results for input(s): VITAMINB12, FOLATE, FERRITIN, TIBC, IRON, RETICCTPCT in the last 72 hours. Sepsis Labs: No results for input(s): PROCALCITON, LATICACIDVEN in the last 168 hours.  No results found for this or any previous visit (from the past  240 hour(s)).       Radiology Studies: Ct Angio Chest Pe W And/or Wo Contrast  Result Date: 11/21/2017 CLINICAL DATA:  Left-sided chest pain. PE suspected. Intermediate probability. Positive D-dimer. EXAM: CT ANGIOGRAPHY CHEST WITH CONTRAST TECHNIQUE: Multidetector CT imaging of the chest was performed using the standard protocol during bolus administration of intravenous contrast. Multiplanar CT image reconstructions and MIPs were obtained to evaluate the vascular anatomy. CONTRAST:  ISOVUE-370 IOPAMIDOL (ISOVUE-370) INJECTION 76% COMPARISON:  One-view chest x-ray from the same day. FINDINGS: Cardiovascular: The heart is enlarged. Coronary artery calcifications are present. Minimal pericardial fluid is present. Pulmonary arterial opacification is excellent. There is some artifact from patient breathing motion and left arm positioning. Patient was unable to place is arm above his head. No focal filling defects are present. Mediastinum/Nodes: No significant mediastinal, axillary, or hilar adenopathy is present. The esophagus is within normal limits. Lungs/Pleura: Right pleural effusion is present. Posterior right middle and lower lobe airspace disease is noted. No definite mass lesion is present.  Small left pleural effusion is present. There is mild left sided atelectasis. Upper Abdomen: Gallstones are present. No inflammatory changes are present about the gallbladder. There is a nodular appearance to the liver. No discrete lesions are present. Musculoskeletal: Fused anterior osteophytes are present. Vertebral body heights are maintained. No focal lytic or blastic lesions are present. Review of the MIP images confirms the above findings. IMPRESSION: 1. Right lower lobe and posterior right middle lobe airspace disease is most concerning for lobar pneumonia. Underlying mass lesion is not discretely visualized. Recommend follow-up CT the chest without contrast 2-3 weeks following appropriate therapy. 2.  Right greater than left pleural effusions. 3. Cardiomegaly without failure. 4. Cholelithiasis without cholecystitis. 5. Nodular appearance of the liver suggesting cirrhosis. Electronically Signed   By: Marin Roberts M.D.   On: 11/21/2017 18:08   Dg Chest Portable 1 View  Result Date: 11/21/2017 CLINICAL DATA:  Shortness of breath.  CHF. EXAM: PORTABLE CHEST 1 VIEW COMPARISON:  01/30/2017. FINDINGS: Cardiomegaly with pulmonary interstitial prominence particularly on the right. Bilateral pleural effusions, right side greater than left. These findings suggest congestive heart failure. No pneumothorax. No acute bony abnormality. IMPRESSION: Findings consistent with congestive heart failure and pulmonary interstitial edema and pleural effusions. Electronically Signed   By: Maisie Fus  Register   On: 11/21/2017 12:21        Scheduled Meds: . aspirin  324 mg Oral Once  . enoxaparin (LOVENOX) injection  70 mg Subcutaneous Q24H  . furosemide  80 mg Oral Daily  . metoprolol tartrate  25 mg Oral BID  . QUEtiapine  50 mg Oral QHS  . sodium chloride flush  3 mL Intravenous Q12H   Continuous Infusions: . sodium chloride    . azithromycin    . cefTRIAXone (ROCEPHIN)  IV       LOS: 1 day     Alwyn Ren, MD [facility]  If 7PM-7AM, please contact night-coverage [email or web address] Password [email or web address] 11/22/2017, 8:55 AM

## 2017-11-22 NOTE — Progress Notes (Signed)
Pharmacy Antibiotic Note  Leonard Montoya is a 77 y.o. male admitted on 11/21/2017 with pneumonia.  Pharmacy has been consulted for Rocephin dosing.  Plan: Rocephin 1g IV Q24H.  Pharmacy will sign off.  Weight: (!) 314 lb (142.4 kg)  Temp (24hrs), Avg:97.7 F (36.5 C), Min:97.7 F (36.5 C), Max:97.7 F (36.5 C)  Recent Labs  Lab 11/21/17 1141  WBC 12.9*  CREATININE 1.10     Allergies  Allergen Reactions  . Sudafed [Pseudoephedrine Hcl] Other (See Comments)    Weird Dreams     Thank you for allowing pharmacy to be a part of this patient's care.  Vernard Gambles, PharmD, BCPS  11/22/2017 1:43 AM

## 2017-11-23 ENCOUNTER — Inpatient Hospital Stay (HOSPITAL_COMMUNITY): Payer: Medicare Other

## 2017-11-23 DIAGNOSIS — I481 Persistent atrial fibrillation: Secondary | ICD-10-CM | POA: Diagnosis not present

## 2017-11-23 DIAGNOSIS — R0902 Hypoxemia: Secondary | ICD-10-CM | POA: Diagnosis not present

## 2017-11-23 DIAGNOSIS — R7989 Other specified abnormal findings of blood chemistry: Secondary | ICD-10-CM | POA: Diagnosis not present

## 2017-11-23 DIAGNOSIS — R079 Chest pain, unspecified: Secondary | ICD-10-CM | POA: Diagnosis not present

## 2017-11-23 DIAGNOSIS — J189 Pneumonia, unspecified organism: Secondary | ICD-10-CM | POA: Diagnosis not present

## 2017-11-23 LAB — URINE CULTURE: SPECIAL REQUESTS: NORMAL

## 2017-11-23 LAB — HCV AB W REFLEX TO QUANT PCR: HCV Ab: <0.1 s/co ratio (ref 0.0–0.9)

## 2017-11-23 LAB — HCV INTERPRETATION

## 2017-11-23 MED ORDER — AZITHROMYCIN 500 MG PO TABS
500.0000 mg | ORAL_TABLET | Freq: Every day | ORAL | Status: DC
  Administered 2017-11-23: 500 mg via ORAL
  Filled 2017-11-23: qty 1

## 2017-11-23 NOTE — Progress Notes (Signed)
PROGRESS NOTE    Leonard Montoya  GEX:[id number] DOB: [birth year] DOA: 11/21/2017 PCP: Donita Brooks, MD   Brief Leonard Montoya y.o.malewith medical history significant forbed-bound status, oxygen dependent (but doesn't use frequently), chronic a-fib (refuses anticoagulation, doesn't take beta blocker), morbid obesity, urinary incontinence (chronic condom cath), asthma, CVA, hearing loss, thrombocytopenia, who presents with chest pain.  Has been going on for 3-4 days. Happened at least once before, a while ago. No recent trauma or heavy lifting. Is constant and comes and goes in intensity. Left chest, does not radiate. Worse with big breaths. Not worse with movement, not relieved by sitting forward. Baseline shortness of breath is unchanged. No significant recent cough. No abdominal pain or n/v. Has chronic intermittent diarrhea. No personal or fam hx of venous thrombus. Denies wheezing. Nothing seems to relieve. Not getting better, not getting worse.  Denies dysuria/hematuria or suprapubic pain but is concerned about "turbid" urine. Uses chronic condom cath.  11/22/2017 patient up in bed trying to eat breakfast this morning.  His breakfast tray is far from him and he is trying to reach that and eat it.  When I was talking to him he kept telling me that he cannot hear me he cannot hear me but as I move the breakfast tray closer to him and asked him he said good? he said okay  yes that is okay. seems to me that he can hear certain things and he cannot hear other things.  He denies shortness of breath.  11/23/2017 patient resting in bed.  He denied any pain no shortness of breath cough at this time.  He is on 3 L of oxygen patient does have oxygen at home.   Assessment & Plan:   Active Problems:   Essential hypertension, benign   Atrial fibrillation (HCC)   Morbid obesity (HCC)   Asthma, chronic   Thrombocytopenia (HCC)   Community acquired pneumonia   Pressure ulcer   Hypoxemia   Aggressive  behavior of adult   Chest pain  1] community-acquired pneumonia-patient admitted with pleuritic chest pain and shortness of breath and cough.  He was found to have elevated WBCs CT of the chest showed no evidence of pulmonary embolism or aortic dissection.  Right lower lobe and posterior right middle lobe airspace disease concerning for lobar pneumonia noted.  Concern for underlying mass lesion.  Recommend repeat CT in 2 to 3 weeks without contrast.  Right greater than left pleural effusion.  Cardiomegaly.  There are appearance of the liver suggesting cirrhosis.  Patient started on Rocephin and azithromycin.  Patient saturation is 100% on 3 L of oxygen.  Will follow up a chest x-ray today to evaluate his pleural effusion.  I will also give him incentive spirometry to use.  At baseline patient is bedridden and bedbound does not walk at home has a chronic condom cath does everything in the bed from the bed which makes him more prone to recurrent pneumonia.  2] atrial fibrillation heart rate anywhere from 59-91.  On metoprolol.  25 mg daily.  Monitor. 3] stage II decubitus ulcer found prior to admission.  Wound consult pending.  4] history of diastolic heart failure on 80 mg Lasix at home which will be continued.  5] history of hyperlipidemia continue Lipitor.  6] aggressive behavior?  On Seroquel at home which is continued.      DVT prophylaxis:  Code Status Family Communication: Disposition Plan:    Consultants:    Procedures:  Antimicrobials:  Subjective:  Objective: Vitals:   11/22/17 1211 11/22/17 2101 11/23/17 0022 11/23/17 0519  BP: 124/74 122/75 132/71 (!) 132/107  Pulse: (!) 107 94 94 99  Resp: Temp: 97.9 F (36.6 C) 97.9 F (36.6 C) 98.6 F (37 C) 98 F (36.7 C)  TempSrc: Oral Oral Oral Oral  SpO2: 100% 99% 100% 100%  Weight:    (!) 141 kg (310 lb 13.6 oz)  Height:        Intake/Output Summary (Last 24 hours) at 11/23/2017 0928 Last data  filed at 11/23/2017 0527 Gross per 24 hour  Intake 2750 ml  Output 1101 ml  Net 1649 ml   Filed Weights   11/21/17 1500 11/22/17 0118 11/23/17 0519  Weight: (!) 154.2 kg (340 lb) (!) 142.4 kg (314 lb) (!) 141 kg (310 lb 13.6 oz)    Examination:  General exam: Appears calm and comfortable  Respiratory system: decreased breath sounds  to auscultation. Respiratory effort normal. Cardiovascular system: S1 & S2 heard, RRR. No JVD, murmurs, rubs, gallops or clicks. No pedal edema. Gastrointestinal system: Abdomen is nondistended, soft and nontender. No organomegaly or masses felt. Normal bowel sounds heard. Central nervous system: Alert and oriented. No focal neurological deficits. Extremities: Symmetric 5 x 5 power. Skin: No rashes, lesions or ulcers Psychiatry: Judgement and insight appear normal. Mood & affect appropriate.     Data Reviewed: I have personally reviewed following labs and imaging studies  CBC: Recent Labs  Lab 11/21/17 1141 11/22/17 0246  WBC 12.9* 14.4*  HGB 12.7* 12.8*  HCT 40.1 40.4  MCV 92.6 92.9  PLT 159 164   Basic Metabolic Panel: Recent Labs  Lab 11/21/17 1141 11/22/17 0246 11/22/17 0247  NA 135  --  135  K 5.1  --  4.8  CL 100*  --  100*  CO2 22  --  22  GLUCOSE 128*  --  128*  BUN 25*  --  25*  CREATININE 1.10 1.17 1.17  CALCIUM 8.9  --  8.8*   GFR: Estimated Creatinine Clearance: 76.8 mL/min (by C-G formula based on SCr of 1.17 mg/dL). Liver Function Tests: No results for input(s): AST, ALT, ALKPHOS, BILITOT, PROT, ALBUMIN in the last 168 hours. No results for input(s): LIPASE, AMYLASE in the last 168 hours. No results for input(s): AMMONIA in the last 168 hours. Coagulation Profile: No results for input(s): INR, PROTIME in the last 168 hours. Cardiac Enzymes: Recent Labs  Lab 11/22/17 0246 11/22/17 0938  TROPONINI 0.04* 0.03*   BNP (last 3 results) No results for input(s): PROBNP in the last 8760 hours. HbA1C: Recent Labs     11/22/17 0247  HGBA1C 5.5   CBG: No results for input(s): GLUCAP in the last 168 hours. Lipid Profile: No results for input(s): CHOL, HDL, LDLCALC, TRIG, CHOLHDL, LDLDIRECT in the last 72 hours. Thyroid Function Tests: No results for input(s): TSH, T4TOTAL, FREET4, T3FREE, THYROIDAB in the last 72 hours. Anemia Panel: No results for input(s): VITAMINB12, FOLATE, FERRITIN, TIBC, IRON, RETICCTPCT in the last 72 hours. Sepsis Labs: No results for input(s): PROCALCITON, LATICACIDVEN in the last 168 hours.  No results found for this or any previous visit (from the past 240 hour(s)).       Radiology Studies: Ct Angio Chest Pe W And/or Wo Contrast  Result Date: 11/21/2017 CLINICAL DATA:  Left-sided chest pain. PE suspected. Intermediate probability. Positive D-dimer. EXAM: CT ANGIOGRAPHY CHEST WITH CONTRAST TECHNIQUE: Multidetector CT imaging of the chest was performed using the  standard protocol during bolus administration of intravenous contrast. Multiplanar CT image reconstructions and MIPs were obtained to evaluate the vascular anatomy. CONTRAST:  ISOVUE-370 IOPAMIDOL (ISOVUE-370) INJECTION 76% COMPARISON:  One-view chest x-ray from the same day. FINDINGS: Cardiovascular: The heart is enlarged. Coronary artery calcifications are present. Minimal pericardial fluid is present. Pulmonary arterial opacification is excellent. There is some artifact from patient breathing motion and left arm positioning. Patient was unable to place is arm above his head. No focal filling defects are present. Mediastinum/Nodes: No significant mediastinal, axillary, or hilar adenopathy is present. The esophagus is within normal limits. Lungs/Pleura: Right pleural effusion is present. Posterior right middle and lower lobe airspace disease is noted. No definite mass lesion is present. Small left pleural effusion is present. There is mild left sided atelectasis. Upper Abdomen: Gallstones are present. No inflammatory  changes are present about the gallbladder. There is a nodular appearance to the liver. No discrete lesions are present. Musculoskeletal: Fused anterior osteophytes are present. Vertebral body heights are maintained. No focal lytic or blastic lesions are present. Review of the MIP images confirms the above findings. IMPRESSION: 1. Right lower lobe and posterior right middle lobe airspace disease is most concerning for lobar pneumonia. Underlying mass lesion is not discretely visualized. Recommend follow-up CT the chest without contrast 2-3 weeks following appropriate therapy. 2. Right greater than left pleural effusions. 3. Cardiomegaly without failure. 4. Cholelithiasis without cholecystitis. 5. Nodular appearance of the liver suggesting cirrhosis. Electronically Signed   By: Marin Roberts M.D.   On: 11/21/2017 18:08   Dg Chest Portable 1 View  Result Date: 11/21/2017 CLINICAL DATA:  Shortness of breath.  CHF. EXAM: PORTABLE CHEST 1 VIEW COMPARISON:  01/30/2017. FINDINGS: Cardiomegaly with pulmonary interstitial prominence particularly on the right. Bilateral pleural effusions, right side greater than left. These findings suggest congestive heart failure. No pneumothorax. No acute bony abnormality. IMPRESSION: Findings consistent with congestive heart failure and pulmonary interstitial edema and pleural effusions. Electronically Signed   By: Maisie Fus  Register   On: 11/21/2017 12:21        Scheduled Meds: . aspirin  324 mg Oral Once  . enoxaparin (LOVENOX) injection  70 mg Subcutaneous Q24H  . furosemide  80 mg Oral Daily  . metoprolol tartrate  25 mg Oral BID  . QUEtiapine  50 mg Oral QHS  . sodium chloride flush  3 mL Intravenous Q12H   Continuous Infusions: . sodium chloride    . azithromycin Stopped (11/22/17 8295)  . cefTRIAXone (ROCEPHIN)  IV Stopped (11/23/17 0018)     LOS: 2 days     Alwyn Ren, MD [facility]  If 7PM-7AM, please contact  night-coverage [email or web address] Password [email or web address] 11/23/2017, 9:28 AM

## 2017-11-23 NOTE — Progress Notes (Signed)
PHARMACIST - PHYSICIAN COMMUNICATION   CONCERNING: IV to Oral Route Change Policy  RECOMMENDATION: This patient is receiving azithromycin by the intravenous route.  Based on criteria approved by the Pharmacy and Therapeutics Committee, the intravenous medication(s) is/are being converted to the equivalent oral dose form(s).   DESCRIPTION: These criteria include:  The patient is eating (either orally or via tube) and/or has been taking other orally administered medications for a least 24 hours  The patient has no evidence of active gastrointestinal bleeding or impaired GI absorption (gastrectomy, short bowel, patient on TNA or NPO).  If you have questions about this conversion, please contact the Pharmacy Department    [phone] )  [facility]   [phone] )  [facility]   [phone] )  [facility]   [phone] )  [facility]   [phone] )  [facility] Qua   Harland German, PharmD Clinical Pharmacist Clinical phone from 8:30-4:00 is [phone] After 4pm, please call Main Rx [phone]) for assistance. 11/23/2017 12:00 PM

## 2017-11-24 DIAGNOSIS — R7989 Other specified abnormal findings of blood chemistry: Secondary | ICD-10-CM | POA: Diagnosis not present

## 2017-11-24 DIAGNOSIS — R0902 Hypoxemia: Secondary | ICD-10-CM | POA: Diagnosis not present

## 2017-11-24 DIAGNOSIS — R4689 Other symptoms and signs involving appearance and behavior: Secondary | ICD-10-CM | POA: Diagnosis not present

## 2017-11-24 DIAGNOSIS — J189 Pneumonia, unspecified organism: Secondary | ICD-10-CM | POA: Diagnosis not present

## 2017-11-24 LAB — BASIC METABOLIC PANEL
Anion gap: 14 (ref 5–15)
BUN: 29 mg/dL — ABNORMAL HIGH (ref 6–20)
CHLORIDE: 100 mmol/L — AB (ref 101–111)
CO2: 28 mmol/L (ref 22–32)
CREATININE: 1.1 mg/dL (ref 0.61–1.24)
Calcium: 8.6 mg/dL — ABNORMAL LOW (ref 8.9–10.3)
GFR calc Af Amer: >60 mL/min (ref >60)
GFR calc non Af Amer: >60 mL/min (ref >60)
Glucose, Bld: 106 mg/dL — ABNORMAL HIGH (ref 65–99)
Potassium: 3.9 mmol/L (ref 3.5–5.1)
SODIUM: 142 mmol/L (ref 135–145)

## 2017-11-24 LAB — CBC WITH DIFFERENTIAL/PLATELET
Basophils Absolute: 0 10*3/uL (ref 0.0–0.1)
Basophils Relative: 0 %
EOS ABS: 0.6 10*3/uL (ref 0.0–0.7)
Eosinophils Relative: 5 %
HCT: 39.7 % (ref 39.0–52.0)
HEMOGLOBIN: 12.4 g/dL — AB (ref 13.0–17.0)
LYMPHS ABS: 2.4 10*3/uL (ref 0.7–4.0)
Lymphocytes Relative: 18 %
MCH: 29.3 pg (ref 26.0–34.0)
MCHC: 31.2 g/dL (ref 30.0–36.0)
MCV: 93.9 fL (ref 78.0–100.0)
Monocytes Absolute: 1.3 10*3/uL — ABNORMAL HIGH (ref 0.1–1.0)
Monocytes Relative: 10 %
NEUTROS PCT: 67 %
Neutro Abs: 8.6 10*3/uL — ABNORMAL HIGH (ref 1.7–7.7)
Platelets: 215 10*3/uL (ref 150–400)
RBC: 4.23 MIL/uL (ref 4.22–5.81)
RDW: 13.3 % (ref 11.5–15.5)
WBC: 12.9 10*3/uL — ABNORMAL HIGH (ref 4.0–10.5)

## 2017-11-24 LAB — PATHOLOGIST SMEAR REVIEW

## 2017-11-24 MED ORDER — AMOXICILLIN-POT CLAVULANATE 875-125 MG PO TABS: 1.0000 | ORAL_TABLET | Freq: Two times a day (BID) | ORAL | 0 refills | Status: AC

## 2017-11-24 MED ORDER — ZINC OXIDE 40 % EX OINT: 1.0000 "application " | TOPICAL_OINTMENT | CUTANEOUS | 0 refills | Status: DC | PRN

## 2017-11-24 NOTE — Consult Note (Signed)
WOC Nurse wound consult note Reason for Consult: Stage 2 ulceration on left buttock is not a pressure injury. Area of partial thickness skin loss is a friction injury. Also noted is an area of MASD secondary to urinary incontinence Wound type: friction Pressure Injury POA:NA Measurement: 2cm x 4cm x 0.1cm Wound WUJ:WJXB, moist Drainage (amount, consistency, odor) scant serous Periwound:intact, dry Dressing procedure/placement/frequency: Patient is discharging to day.  Will continue silicone foam or recommend moisture barrier ointment (e.g., Desitin ointment) to the area until it resurfaces.   WOC nursing team will not follow, but will remain available to this patient, the nursing and medical teams.  Please re-consult if needed. Thanks, Ladona Mow, MSN, RN, GNP, [place]  Pager# [phone]

## 2017-11-24 NOTE — Progress Notes (Signed)
Pt has orders to discharge home, pt verbalizes concern to get home " I need an ambulance and I need my caregiver lady to be there to meet [place]. Pt says he has son there but he is not the most help as his caregiver. Left message for Andrey Campanile regards to pt wanting to ensure caregiver home when he gets there

## 2017-11-24 NOTE — Progress Notes (Addendum)
VM left with Andrey Campanile ( daughter [phone]) concerning discharge home; call back requested; Alexis Goodell [phone]  11:26 am - CM talked to [place], home address verified; pt will go home via ambulance nonemergent; he has all of his DME at his home;private caregiver 4 hours a day; home oxygen; Alexis Goodell [phone]

## 2017-11-24 NOTE — Discharge Summary (Addendum)
Physician Discharge Summary  Leonard Montoya ZOX:[id number] DOB: 08/18/1940 DOA: 11/21/2017  PCP: Donita Brooks, MD  Admit date: 11/21/2017 Discharge date: 11/24/2017  Admitted From: Home home Disposition: Home Recommendations for Outpatient Follow-up:  1. Follow up with PCP in 1-2 weeks 2. Please obtain BMP/CBC in one week 3. CT chest follow-up in 3 weeks to evaluate possible mass  Home Health: None Equipment/Devices: None Discharge Condition: Stable CODE STATUS DO NOT RESUSCITATE  Diet recommendation cardiac diet Brief/Interim Summary:Narrative77 y.o.malewith medical history significant forbed-bound status, oxygen dependent (but doesn't use frequently), chronic a-fib (refuses anticoagulation, doesn't take beta blocker), morbid obesity, urinary incontinence (chronic condom cath), asthma, CVA, hearing loss, thrombocytopenia, who presents with chest pain.  Has been going on for 3-4 days. Happened at least once before, a while ago. No recent trauma or heavy lifting. Is constant and comes and goes in intensity. Left chest, does not radiate. Worse with big breaths. Not worse with movement, not relieved by sitting forward. Baseline shortness of breath is unchanged. No significant recent cough. No abdominal pain or n/v. Has chronic intermittent diarrhea. No personal or fam hx of venous thrombus. Denies wheezing. Nothing seems to relieve. Not getting better, not getting worse.  Denies dysuria/hematuria or suprapubic pain but is concerned about "turbid" urine. Uses chronic condom cath.    Discharge Diagnoses:  Active Problems:   Essential hypertension, benign   Atrial fibrillation (HCC)   Morbid obesity (HCC)   Asthma, chronic   Thrombocytopenia (HCC)   Community acquired pneumonia   Pressure ulcer   Hypoxemia   Aggressive behavior of adult   Chest pain   Hypoxia  1]community-acquired pneumonia-patient admitted with pleuritic chest pain and shortness of breath and cough. He was  found to have elevated WBCs CT of the chest showed no evidence of pulmonary embolism or aortic dissection. Right lower lobe and posterior right middle lobe airspace disease concerning for lobar pneumonia noted. Concern for underlying mass lesion. Recommend repeat CT in 2 to 3 weeks without contrast. Right greater than left pleural effusion. Cardiomegaly. There are appearance of the liver suggesting cirrhosis. Patient was  started on Rocephin and azithromycin.  Patient saturation is 100% on 3 L of oxygen.  On the day of discharge his saturation is 94% on room air.  I have given him a incentive spirometry to use at home.  I will discharge him on Augmentin for 10 days.  I called his daughter Andrey Campanile and left a voicemail message.  2]atrial fibrillation heart rate anywhere from 59-91. On metoprolol. 25 mg daily. Monitor.  3]left buttock stage II friction injury not pressure ulcer seen by wound care today recommend Desitin up to be applied daily.  4]history of diastolic heart failure on 79 mg Lasix at home which will be continued.  5]history of hyperlipidemia continue Lipitor.  6]aggressive behavior?On Seroquel at home which is continued.     Discharge Instructions  Discharge Instructions    Call MD for:  difficulty breathing, headache or visual disturbances   Complete by:  As directed    Call MD for:  persistant dizziness or light-headedness   Complete by:  As directed    Call MD for:  persistant nausea and vomiting   Complete by:  As directed    Call MD for:  severe uncontrolled pain   Complete by:  As directed    Diet - low sodium heart healthy   Complete by:  As directed    Increase activity slowly   Complete by:  As directed      Allergies as of 11/24/2017      Reactions   Sudafed [pseudoephedrine Hcl] Other (See Comments)   Weird Dreams      Medication List    STOP taking these medications   aspirin 325 MG tablet   BENADRYL 25 MG tablet Generic drug:   diphenhydrAMINE   levofloxacin 500 MG tablet Commonly known as:  LEVAQUIN   loperamide 2 MG capsule Commonly known as:  IMODIUM   LORazepam 2 MG/ML concentrated solution Commonly known as:  ATIVAN   morphine 20 MG/ML concentrated solution Commonly known as:  ROXANOL     TAKE these medications   acetaminophen 325 MG tablet Commonly known as:  TYLENOL Take 650 mg by mouth every 6 (six) hours as needed for mild pain.   albuterol 108 (90 Base) MCG/ACT inhaler Commonly known as:  PROVENTIL HFA;VENTOLIN HFA Inhale 1-2 puffs into the lungs every 6 (six) hours as needed for wheezing.   amoxicillin-clavulanate 875-125 MG tablet Commonly known as:  AUGMENTIN Take 1 tablet by mouth 2 (two) times daily for 10 days.   atorvastatin 20 MG tablet Commonly known as:  LIPITOR Take 1 tablet (20 mg total) by mouth daily at 6 PM.   furosemide 40 MG tablet Commonly known as:  LASIX Take 80 mg by mouth daily.   metoprolol tartrate 25 MG tablet Commonly known as:  LOPRESSOR Take 1 tablet (25 mg total) by mouth 2 (two) times daily.   QUEtiapine 50 MG tablet Commonly known as:  SEROQUEL Take 1 tablet (50 mg total) by mouth at bedtime. What changed:  when to take this      Follow-up Information    Donita Brooks, MD Follow up.   Specialty:  Family Medicine Contact information: [address] [place] [address] [phone]          Allergies  Allergen Reactions  . Sudafed [Pseudoephedrine Hcl] Other (See Comments)    Weird Dreams    Consultations:  None   Procedures/Studies: Dg Chest 2 View  Result Date: 11/23/2017 CLINICAL DATA:  77 year old male with history of hypoxia. EXAM: CHEST - 2 VIEW COMPARISON:  Chest x-ray 11/21/2017. FINDINGS: Lung volumes are normal. Bibasilar opacities (right greater than left), which may reflect areas of atelectasis and/or consolidation. Small bilateral pleural effusions. Cephalization of the pulmonary vasculature, without  frank pulmonary edema. Moderate cardiomegaly. The patient is rotated to the left on today's exam, resulting in distortion of the mediastinal contours and reduced diagnostic sensitivity and specificity for mediastinal pathology. IMPRESSION: 1. Bibasilar atelectasis and/or consolidation with superimposed small bilateral pleural effusions. 2. Moderate cardiomegaly with cephalization of the pulmonary vasculature, but no frank pulmonary pulmonary edema. Electronically Signed   By: Trudie Reed M.D.   On: 11/23/2017 15:44   Ct Angio Chest Pe W And/or Wo Contrast  Result Date: 11/21/2017 CLINICAL DATA:  Left-sided chest pain. PE suspected. Intermediate probability. Positive D-dimer. EXAM: CT ANGIOGRAPHY CHEST WITH CONTRAST TECHNIQUE: Multidetector CT imaging of the chest was performed using the standard protocol during bolus administration of intravenous contrast. Multiplanar CT image reconstructions and MIPs were obtained to evaluate the vascular anatomy. CONTRAST:  ISOVUE-370 IOPAMIDOL (ISOVUE-370) INJECTION 76% COMPARISON:  One-view chest x-ray from the same day. FINDINGS: Cardiovascular: The heart is enlarged. Coronary artery calcifications are present. Minimal pericardial fluid is present. Pulmonary arterial opacification is excellent. There is some artifact from patient breathing motion and left arm positioning. Patient was unable to place is  arm above his head. No focal filling defects are present. Mediastinum/Nodes: No significant mediastinal, axillary, or hilar adenopathy is present. The esophagus is within normal limits. Lungs/Pleura: Right pleural effusion is present. Posterior right middle and lower lobe airspace disease is noted. No definite mass lesion is present. Small left pleural effusion is present. There is mild left sided atelectasis. Upper Abdomen: Gallstones are present. No inflammatory changes are present about the gallbladder. There is a nodular appearance to the liver. No discrete  lesions are present. Musculoskeletal: Fused anterior osteophytes are present. Vertebral body heights are maintained. No focal lytic or blastic lesions are present. Review of the MIP images confirms the above findings. IMPRESSION: 1. Right lower lobe and posterior right middle lobe airspace disease is most concerning for lobar pneumonia. Underlying mass lesion is not discretely visualized. Recommend follow-up CT the chest without contrast 2-3 weeks following appropriate therapy. 2. Right greater than left pleural effusions. 3. Cardiomegaly without failure. 4. Cholelithiasis without cholecystitis. 5. Nodular appearance of the liver suggesting cirrhosis. Electronically Signed   By: Marin Roberts M.D.   On: 11/21/2017 18:08   Dg Chest Portable 1 View  Result Date: 11/21/2017 CLINICAL DATA:  Shortness of breath.  CHF. EXAM: PORTABLE CHEST 1 VIEW COMPARISON:  01/30/2017. FINDINGS: Cardiomegaly with pulmonary interstitial prominence particularly on the right. Bilateral pleural effusions, right side greater than left. These findings suggest congestive heart failure. No pneumothorax. No acute bony abnormality. IMPRESSION: Findings consistent with congestive heart failure and pulmonary interstitial edema and pleural effusions. Electronically Signed   By: Maisie Fus  Register   On: 11/21/2017 12:21    (Echo, Carotid, EGD, Colonoscopy, ERCP)    Subjective:   Discharge Exam: Vitals:   11/23/17 1938 11/24/17 0357  BP: (!) 154/77 105/63  Pulse: 73 (!) 52  Resp: 16 18  Temp: 97.7 F (36.5 C) (!) 97.3 F (36.3 C)  SpO2: 96% 98%   Vitals:   11/23/17 1000 11/23/17 1349 11/23/17 1938 11/24/17 0357  BP:  126/85 (!) 154/77 105/63  Pulse:  80 73 (!) 52  Resp:  Temp:  98 F (36.7 C) 97.7 F (36.5 C) (!) 97.3 F (36.3 C)  TempSrc:  Oral Oral Oral  SpO2: 99% 95% 96% 98%  Weight:    (!) 140.2 kg (309 lb 1.4 oz)  Height:        General: Pt is alert, awake, not in acute  distress Cardiovascular: RRR, S1/S2 +, no rubs, no gallops Respiratory: CTA bilaterally, no wheezing, no rhonchi Abdominal: Soft, NT, ND, bowel sounds + Extremities: no edema, no cyanosis    The results of significant diagnostics from this hospitalization (including imaging, microbiology, ancillary and laboratory) are listed below for reference.     Microbiology: Recent Results (from the past 240 hour(s))  Urine Culture     Status: Abnormal   Collection Time: 11/22/17  5:07 PM  Result Value Ref Range Status   Specimen Description URINE, CLEAN CATCH  Final   Special Requests   Final    Normal Performed at [facility], [address], [place], [place] [address]    Culture MULTIPLE SPECIES PRESENT, SUGGEST RECOLLECTION (A)  Final   Report Status 11/23/2017 FINAL  Final     Labs: BNP (last 3 results) Recent Labs    11/21/17 1141  BNP 222.3*   Basic Metabolic Panel: Recent Labs  Lab 11/21/17 1141 11/22/17 0246 11/22/17 0247 11/24/17 0528  NA 135  --  135 142  K  5.1  --  4.8 3.9  CL 100*  --  100* 100*  CO2 22  --  22 28  GLUCOSE 128*  --  128* 106*  BUN 25*  --  25* 29*  CREATININE 1.10 1.17 1.17 1.10  CALCIUM 8.9  --  8.8* 8.6*   Liver Function Tests: No results for input(s): AST, ALT, ALKPHOS, BILITOT, PROT, ALBUMIN in the last 168 hours. No results for input(s): LIPASE, AMYLASE in the last 168 hours. No results for input(s): AMMONIA in the last 168 hours. CBC: Recent Labs  Lab 11/21/17 1141 11/22/17 0246 11/24/17 0528  WBC 12.9* 14.4* 12.9*  NEUTROABS  --   --  8.6*  HGB 12.7* 12.8* 12.4*  HCT 40.1 40.4 39.7  MCV 92.6 92.9 93.9  PLT 159 164 215   Cardiac Enzymes: Recent Labs  Lab 11/22/17 0246 11/22/17 0938  TROPONINI 0.04* 0.03*   BNP: Invalid input(s): POCBNP CBG: No results for input(s): GLUCAP in the last 168 hours. D-Dimer Recent Labs    11/21/17 1434  DDIMER 6.22*   Hgb A1c Recent Labs    11/22/17 0247  HGBA1C 5.5    Lipid Profile No results for input(s): CHOL, HDL, LDLCALC, TRIG, CHOLHDL, LDLDIRECT in the last 72 hours. Thyroid function studies No results for input(s): TSH, T4TOTAL, T3FREE, THYROIDAB in the last 72 hours.  Invalid input(s): FREET3 Anemia work up No results for input(s): VITAMINB12, FOLATE, FERRITIN, TIBC, IRON, RETICCTPCT in the last 72 hours. Urinalysis    Component Value Date/Time   COLORURINE YELLOW 11/22/2017 0044   APPEARANCEUR CLOUDY (A) 11/22/2017 0044   LABSPEC 1.026 11/22/2017 0044   PHURINE 9.0 (H) 11/22/2017 0044   GLUCOSEU NEGATIVE 11/22/2017 0044   HGBUR LARGE (A) 11/22/2017 0044   BILIRUBINUR NEGATIVE 11/22/2017 0044   KETONESUR NEGATIVE 11/22/2017 0044   PROTEINUR 100 (A) 11/22/2017 0044   NITRITE NEGATIVE 11/22/2017 0044   LEUKOCYTESUR LARGE (A) 11/22/2017 0044   Sepsis Labs Invalid input(s): PROCALCITONIN,  WBC,  LACTICIDVEN Microbiology Recent Results (from the past 240 hour(s))  Urine Culture     Status: Abnormal   Collection Time: 11/22/17  5:07 PM  Result Value Ref Range Status   Specimen Description URINE, CLEAN CATCH  Final   Special Requests   Final    Normal Performed at [facility], [address], [place], [place] [address]    Culture MULTIPLE SPECIES PRESENT, SUGGEST RECOLLECTION (A)  Final   Report Status 11/23/2017 FINAL  Final     Time coordinating discharge: Over 39 minutes  SIGNED:   Alwyn Ren, MD  [facility] 11/24/2017, 8:28 AM Pager   If 7PM-7AM, please contact night-coverage [email or web address] Password [email or web address]

## 2017-11-24 NOTE — Progress Notes (Signed)
Pt given dc instructions, did not want to sign paperwork but verbalized understanding, he says his caregiver gives his meds but not there all the time, discussed he has meds such as antibiotic and metoprolol bid and seroquel at night then other meds in am , discussed a pill box and he says he has one. I advised she could fill it and he could have near him he also lives with his son. Wound care nurse was in earlier and left foam dressing on pt with orders for desitin as needed for buttock abrasion,[facility] here to take pt home via stretcher, pt in stable condition, asking if lunch was available and advised would not be here for another 30 min, pt agreed to [place] sandwich to go

## 2017-12-02 ENCOUNTER — Inpatient Hospital Stay: Payer: Medicare HMO | Admitting: Family Medicine

## 2017-12-08 ENCOUNTER — Encounter: Payer: Self-pay | Admitting: Family Medicine

## 2017-12-08 ENCOUNTER — Other Ambulatory Visit: Payer: Self-pay | Admitting: Internal Medicine

## 2017-12-08 DIAGNOSIS — J189 Pneumonia, unspecified organism: Secondary | ICD-10-CM

## 2017-12-16 ENCOUNTER — Telehealth: Payer: Self-pay

## 2017-12-16 NOTE — Telephone Encounter (Signed)
Visit scheduled for Palliative Care Visit.

## 2017-12-17 ENCOUNTER — Telehealth: Payer: Self-pay

## 2017-12-17 NOTE — Telephone Encounter (Signed)
Verified time of Palliative Care visit to be between 9:30 am and 10:00 am on 12/18/17

## 2017-12-18 ENCOUNTER — Other Ambulatory Visit: Payer: Medicare HMO | Admitting: Internal Medicine

## 2017-12-18 DIAGNOSIS — Z7409 Other reduced mobility: Secondary | ICD-10-CM

## 2017-12-18 DIAGNOSIS — R079 Chest pain, unspecified: Secondary | ICD-10-CM

## 2017-12-18 DIAGNOSIS — Z515 Encounter for palliative care: Secondary | ICD-10-CM

## 2017-12-18 DIAGNOSIS — Z7189 Other specified counseling: Secondary | ICD-10-CM

## 2017-12-18 DIAGNOSIS — R630 Anorexia: Secondary | ICD-10-CM

## 2017-12-18 DIAGNOSIS — R400 Somnolence: Secondary | ICD-10-CM

## 2017-12-19 ENCOUNTER — Encounter: Payer: Self-pay | Admitting: Internal Medicine

## 2017-12-19 NOTE — Progress Notes (Signed)
12/18/2017 PALLIATIVE CARE CONSULT Leonard Montoya. DOB: 10/31/1940. [address] [place] [address]. [phone] (M) PROVIDER: PCP Dr. Lynnea Ferrier Family: (dtr/HCPOA) Leonard Montoya  [id number]) Leonard Montoya [phone], [phone] Paid care giver: Leonard Montoya [phone] works  21 h/wk; other care provision by Leonard Montoya.  BILLABLE  ICD-10:  IMPRESSION/RECOMMENDATIONS: 1. Left anterior chest pain; likely muscular/skeletal: - Chronic (for years" left lower anterior chest pain "sharp" lasts about and described as severe. Brought on by sneezing; can occur spontaneously or with limited exertion. Sometimes associated left arm  discomfort. Has had chronically for years, but increased frequency over the last 4 months.  Unable to tell me when last episode was;  states extremely mild at this moment. Mentions that  if it were cardiac in etiology (though atypical presentation) that he would not want to seek treatment at the ER, or have worked up.  -chronic bilateral LE discomfort  a. Recommended prn Tylenol  2. Mobility disorder; bed bound status r/t morbid obesity and debilitated status:  -PCG notes patient needing increased 1 person heavy assist  to sit on side of bed, maneuver about in bed, since hospital discharge (for pneumonia) 3 weeks earlier. Last night and this morning, however, doing a little better (sat up independently on side of bed).  3. Decreased appetite/recent nausea:  -Paid care giver notes marked decreased oral intake over prior 2 weeks; just bites and about 32 oz water/day. Caregiver noted decreased urine output (condom cath) as well, though this has now improved. Emesis 2 days earlier. Choking on his pills yesterday; finally coughed up with small amount emesis.  Improved last 24 hours; last eve ate whole subway sandwich, and this am few bites fried chicken/green beans. Moving bowels okay. - Patient states he is not eating because he is attempting to lose weight  (states he was 400lbs last year), realizing he needs to lose weight to  improve health status. Says his family members are not on board with this and want to keep pushing food on him. States he is okay with eating 1-2 small meals/d  a. Aspiration precautions reviewed (small bites/moist consistency foods/swallowing first bite before taking second/sitting upright when eating) and reviewed with care givers how to aid patient should he show signs of choking.  4. Reported Pressure Injury: Paid caregiver had reported right LE skin breakdown to me, but his skin looked intact to my exam. Buttocks skin abrasion (noted in hospital) appears healed; managed with barrier cream.    5. Somnolence: -Paid caregiver had noted patient with excessive somnolence over the last few weeks, so that patient was less engaged, sleepy. She weaned patient off of his Seroquel and Benadryl, and has noted increased alertness. Behavior remains baseline grouchy but otherwise fine. I concurred with this management. Family note patient with h/o prior strokes, and realize that he remains at high risk for the same, setting of chronic afib without anticoagulation. Patient is very hard of hearing.  6. Advanced Care Planning: Discussed current DNR with patient; he validated that those were his wishes. We discussed MOST form; he indicated DNR/DNI, and limited scope of medical care (DNI/avoid ICU). Wished IV antibiotics if indicated, but was not sure about IVFs. Patient wishes to f/u on completing MOST form. Family requesting patient consideration for hospice care; I discussed that he does not currently appear to meet criteria, but will follow along  7. Follow up: NP visit in next 2 weeks; f/u completion of MOST form. Try to align family  wishes with those of patient.    I spent 75  minutes providing this consultation (from 11am to 12:15 pm). More than 50% of that time was spent coordinating communication.  HPI: Leonard Montoya is a 77 yo male,  with medical history significant for bed-bound status, chronic a-fib, morbid obesity, urinary incontinence (chronic condom cath), asthma, CVA, hearing loss, and normal pressure hydrocephalus . Referred to Palliative Care for assistance with symptom management, help with coordination of resources, and ongoing discussions regarding goals of care/advanced directives  CODE STATUS:  DNR in the home, taped over the bed PPS: 30%. (ROS)   HOSPICE ELIGIBILITY: no; chronic illnesses but prognosis felt greater than 6 months.  MEDICATIONS :  acetaminophen  q6h prn, Augmentin 875-125mg  bid (finishing up today) , atorvastatin   qd (not taking), furosemide  qd, quetiapine pumarate  qd (Caregivers have  weaned to off because of excessive somnolence over last week), diphenhydramine  qd prn (holding for now b/c of excessive somnolence), Imodium  prn diarrhea, aspirin  qd,  ALLERGIES: Sudafed (weird dreams)  PAST MEDICAL HISTORY: HTN, atrial fibrillation, morbid obesity, thrombocytopenia, pneumonia, pressure ulcer, hypoxemia, aggressive behavior of an adult, bed bound status, urinary incontinence (condom cath), asthma, CVA, HOH, seasonal allergies, arthritis, BPH, Gout, hyperlipidemia, cognitive decline, normal pressure hydroencephalopathy (family deferred further neurological w/u or intervention)  Hosp adm 05/03-05/12/2017: Community-acquired pneumonia (RLL, post RML). Concern for underlyng mass lesion; recommend repeat CT in 2-3 weeks without contrast. Discharged on Augmentin x 10d.  PHYSICAL EXAM:  VS: 112/56, HR 96 (irregular), SAT 97% room air. RR 16   HEENT: [place], AT LUNGS:  bilat CTA CARDIAC:  irregular; no MRG ABD: obese, protruding, soft, NSBS EXTREMITIES: firm LE swelling but non pitting SKIN:  lower circumferential shin skin color changes of venous stasis.  NEURO: A & O x 3  Leonard Bodily NP-C

## 2018-05-04 ENCOUNTER — Telehealth: Payer: Self-pay

## 2018-05-04 NOTE — Telephone Encounter (Signed)
Message left for patient's caregiver to offer to schedule a visit with Palliative Care

## 2018-05-06 ENCOUNTER — Telehealth: Payer: Self-pay

## 2018-05-06 NOTE — Telephone Encounter (Signed)
Message sent to daughter to offer to schedule a follow up visit with Palliative Care. Daughter to speak with her brother and return call

## 2018-05-25 ENCOUNTER — Telehealth: Payer: Self-pay

## 2018-05-25 NOTE — Telephone Encounter (Signed)
Phone call placed to offer visit with NP. Phone rang with no answer

## 2018-05-27 ENCOUNTER — Other Ambulatory Visit: Payer: Medicare HMO | Admitting: Primary Care

## 2018-05-27 ENCOUNTER — Telehealth: Payer: Self-pay

## 2018-05-27 DIAGNOSIS — Z515 Encounter for palliative care: Secondary | ICD-10-CM

## 2018-05-27 NOTE — Progress Notes (Signed)
Palliative Care NP arrived at home for scheduled appointment, but [facility] NP was also there. Son present, made appointment in person to return on 05/29/18 at 11 am.

## 2018-05-27 NOTE — Telephone Encounter (Signed)
Spoke with daughter, Dois Davenport. Visit confirmed for 05/27/18 @ 1pm

## 2018-05-29 ENCOUNTER — Other Ambulatory Visit: Payer: Medicare HMO | Admitting: Primary Care

## 2018-05-29 DIAGNOSIS — Z515 Encounter for palliative care: Secondary | ICD-10-CM

## 2018-05-29 DIAGNOSIS — Z7189 Other specified counseling: Secondary | ICD-10-CM

## 2018-05-29 DIAGNOSIS — Z7409 Other reduced mobility: Secondary | ICD-10-CM

## 2018-05-29 NOTE — Progress Notes (Signed)
PALLIATIVE CARE CONSULT VISIT   PATIENT NAME: Leonard Montoya DOB: February 01, 1941 MRN: [id number]  PRIMARY CARE PROVIDER:   Donita Brooks, MD   Dr. Harvest Dark  REFERRING PROVIDER:  Donita Brooks, MD [id number] [facility] [address] [place], [place] [address]  RESPONSIBLE PARTY:    Self and son,Chick Phineas Douglas. Of the home  ASSESSMENT and RECOMMENDATIONS   77 year old bed bound patient, interactive but does not hear well. Poor historian and son is also present, but not able to add much to the history. Previous palliative assessment reveals patient is fairly stable with many of the same issues. Home plan of care has not been followed. This was reiterated today.  1. Pain: Acetaminophen 1000 mg q 6 hr alternate with ibuprofen 600 mg q 6 hrs. Patient states chronic left costochondral pain. Son states patient takes tramadol for this pain and asks for refill today. Noted a  bottle of aspirin on the floor, overturned. Pt states he takes aspirin  for pain but nothing helps much. Education provided that above will be better for MSK pain than narcotic.   2. Immobility: Patient does not get OOB. He appears WNWD and watches tv to pass the time. He has severe hearing loss and uses ear buds for tv viewing. At risk for immobility sequelae but son states he is not able to get oob.  3. Goals of Care: DNR present in room. Patient has home services (insurance NP, home visiting Physician). Son provides for physical needs and patient appears comfortable. Patient is eating quite well per son and appears younger than his reported age. Home visiting MD attends to him when needed; encouraged to have a visit soon as it's been 6 months for medication renewal. Patient appears to be stable and have completed care planning.   Discharge from  Palliative care and will be happy to see again if patient exhibits signs of decline. Note that patient has home MD and home NP from insurance [employer].  I spent 25 minutes providing this  consultation,  from 11:45 to 12:10. More than 50% of the time in this consultation was spent coordinating communication.   HISTORY OF PRESENT ILLNESS:  Leonard Montoya is a 78 y.o. year old male with multiple medical problems including obesity, a fib, BPH,arthritis . Palliative Care was asked to help address goals of care.   CODE STATUS: DNR  PPS: 30% HOSPICE ELIGIBILITY/DIAGNOSIS: prognosis greater than 6 months.  PAST MEDICAL HISTORY:  Past Medical History:  Diagnosis Date  . Allergy 1978   rag weed, dust, animal dander, pollen  . Arthritis   . Asthma   . Atrial fibrillation (HCC)   . BPH (benign prostatic hypertrophy)   . Chronic insomnia   . Gout   . Hyperlipidemia   . Hypertension   . Obesity 1974    SOCIAL HX:  Social History   Tobacco Use  . Smoking status: Former Smoker    Last attempt to quit: 07/23/1991    Years since quitting: 26.8  . Smokeless tobacco: Never Used  Substance Use Topics  . Alcohol use: No    ALLERGIES:  Allergies  Allergen Reactions  . Sudafed [Pseudoephedrine Hcl] Other (See Comments)    Weird Dreams     PERTINENT MEDICATIONS:  Outpatient Encounter Medications as of 05/29/2018  Medication Sig  . acetaminophen (TYLENOL) 325 MG tablet Take 650 mg by mouth every 6 (six) hours as needed for mild pain.  Marland Kitchen albuterol (PROVENTIL HFA;VENTOLIN  HFA) 108 (90 Base) MCG/ACT inhaler Inhale 1-2 puffs into the lungs every 6 (six) hours as needed for wheezing. (Patient not taking: Reported on 07/04/2016)  . atorvastatin (LIPITOR) 20 MG tablet Take 1 tablet (20 mg total) by mouth daily at 6 PM. (Patient not taking: Reported on 11/21/2017)  . furosemide (LASIX) 40 MG tablet Take 80 mg by mouth daily.   Marland Kitchen liver oil-zinc oxide (DESITIN) 40 % ointment Apply 1 application topically as needed for irritation.  . metoprolol tartrate (LOPRESSOR) 25 MG tablet Take 1 tablet (25 mg total) by mouth 2 (two) times daily. (Patient not taking: Reported on 11/21/2017)  . QUEtiapine  (SEROQUEL) 50 MG tablet Take 1 tablet (50 mg total) by mouth at bedtime. (Patient taking differently: Take 50 mg by mouth daily. )   No facility-administered encounter medications on file as of 05/29/2018.     PHYSICAL EXAM:  VS 98.1-60-18 96/64 RA PO2= 95%  General: NAD, WNWD appearing, obese Cardiovascular: regular rate and rhythm, S1 S 2 Pulmonary: clear all fields Abdomen: soft, nontender, + bowel sounds, appetite very good. No dysphagia reported GU: no suprapubic tenderness, condom cath Extremities: no edema, no joint deformities Skin: no rashes, PVD skin discolored on lower LE Neurological: Weakness, HOH, poor recall of events.  Marijo File DNP, AGPCNP-BC

## 2019-06-07 ENCOUNTER — Emergency Department (HOSPITAL_COMMUNITY): Payer: Medicare HMO

## 2019-06-07 ENCOUNTER — Inpatient Hospital Stay (HOSPITAL_COMMUNITY)
Admission: EM | Admit: 2019-06-07 | Discharge: 2019-06-11 | DRG: 689 | Disposition: A | Discharge status: Hospice | Payer: Medicare HMO | Attending: Internal Medicine | Admitting: Internal Medicine

## 2019-06-07 ENCOUNTER — Encounter (HOSPITAL_COMMUNITY): Payer: Self-pay | Admitting: *Deleted

## 2019-06-07 ENCOUNTER — Other Ambulatory Visit: Payer: Self-pay

## 2019-06-07 DIAGNOSIS — I4891 Unspecified atrial fibrillation: Secondary | ICD-10-CM | POA: Diagnosis not present

## 2019-06-07 DIAGNOSIS — Z8261 Family history of arthritis: Secondary | ICD-10-CM

## 2019-06-07 DIAGNOSIS — M1990 Unspecified osteoarthritis, unspecified site: Secondary | ICD-10-CM | POA: Diagnosis present

## 2019-06-07 DIAGNOSIS — G9340 Encephalopathy, unspecified: Secondary | ICD-10-CM | POA: Diagnosis not present

## 2019-06-07 DIAGNOSIS — E785 Hyperlipidemia, unspecified: Secondary | ICD-10-CM | POA: Diagnosis present

## 2019-06-07 DIAGNOSIS — Z20828 Contact with and (suspected) exposure to other viral communicable diseases: Secondary | ICD-10-CM | POA: Diagnosis present

## 2019-06-07 DIAGNOSIS — J45909 Unspecified asthma, uncomplicated: Secondary | ICD-10-CM | POA: Diagnosis present

## 2019-06-07 DIAGNOSIS — R4182 Altered mental status, unspecified: Secondary | ICD-10-CM

## 2019-06-07 DIAGNOSIS — G92 Toxic encephalopathy: Secondary | ICD-10-CM | POA: Diagnosis present

## 2019-06-07 DIAGNOSIS — Z515 Encounter for palliative care: Secondary | ICD-10-CM | POA: Diagnosis present

## 2019-06-07 DIAGNOSIS — F5104 Psychophysiologic insomnia: Secondary | ICD-10-CM | POA: Diagnosis present

## 2019-06-07 DIAGNOSIS — G40909 Epilepsy, unspecified, not intractable, without status epilepticus: Secondary | ICD-10-CM | POA: Diagnosis present

## 2019-06-07 DIAGNOSIS — Z7401 Bed confinement status: Secondary | ICD-10-CM | POA: Diagnosis not present

## 2019-06-07 DIAGNOSIS — J9601 Acute respiratory failure with hypoxia: Secondary | ICD-10-CM | POA: Diagnosis not present

## 2019-06-07 DIAGNOSIS — Z66 Do not resuscitate: Secondary | ICD-10-CM | POA: Diagnosis present

## 2019-06-07 DIAGNOSIS — J189 Pneumonia, unspecified organism: Secondary | ICD-10-CM | POA: Diagnosis present

## 2019-06-07 DIAGNOSIS — T83511A Infection and inflammatory reaction due to indwelling urethral catheter, initial encounter: Secondary | ICD-10-CM

## 2019-06-07 DIAGNOSIS — I10 Essential (primary) hypertension: Secondary | ICD-10-CM | POA: Diagnosis present

## 2019-06-07 DIAGNOSIS — R569 Unspecified convulsions: Secondary | ICD-10-CM | POA: Diagnosis not present

## 2019-06-07 DIAGNOSIS — N3000 Acute cystitis without hematuria: Secondary | ICD-10-CM

## 2019-06-07 DIAGNOSIS — I4819 Other persistent atrial fibrillation: Secondary | ICD-10-CM | POA: Diagnosis present

## 2019-06-07 DIAGNOSIS — B952 Enterococcus as the cause of diseases classified elsewhere: Secondary | ICD-10-CM | POA: Diagnosis present

## 2019-06-07 DIAGNOSIS — N400 Enlarged prostate without lower urinary tract symptoms: Secondary | ICD-10-CM | POA: Diagnosis present

## 2019-06-07 DIAGNOSIS — Z79899 Other long term (current) drug therapy: Secondary | ICD-10-CM

## 2019-06-07 DIAGNOSIS — Z8673 Personal history of transient ischemic attack (TIA), and cerebral infarction without residual deficits: Secondary | ICD-10-CM | POA: Diagnosis not present

## 2019-06-07 DIAGNOSIS — I110 Hypertensive heart disease with heart failure: Secondary | ICD-10-CM | POA: Diagnosis present

## 2019-06-07 DIAGNOSIS — Z87440 Personal history of urinary (tract) infections: Secondary | ICD-10-CM

## 2019-06-07 DIAGNOSIS — E870 Hyperosmolality and hypernatremia: Secondary | ICD-10-CM | POA: Diagnosis present

## 2019-06-07 DIAGNOSIS — I639 Cerebral infarction, unspecified: Secondary | ICD-10-CM | POA: Diagnosis present

## 2019-06-07 DIAGNOSIS — L89154 Pressure ulcer of sacral region, stage 4: Secondary | ICD-10-CM | POA: Diagnosis present

## 2019-06-07 DIAGNOSIS — I5042 Chronic combined systolic (congestive) and diastolic (congestive) heart failure: Secondary | ICD-10-CM | POA: Diagnosis present

## 2019-06-07 DIAGNOSIS — N390 Urinary tract infection, site not specified: Secondary | ICD-10-CM | POA: Diagnosis present

## 2019-06-07 DIAGNOSIS — L8990 Pressure ulcer of unspecified site, unspecified stage: Secondary | ICD-10-CM | POA: Diagnosis present

## 2019-06-07 DIAGNOSIS — Z888 Allergy status to other drugs, medicaments and biological substances status: Secondary | ICD-10-CM

## 2019-06-07 DIAGNOSIS — M109 Gout, unspecified: Secondary | ICD-10-CM | POA: Diagnosis present

## 2019-06-07 DIAGNOSIS — G8311 Monoplegia of lower limb affecting right dominant side: Secondary | ICD-10-CM | POA: Diagnosis present

## 2019-06-07 DIAGNOSIS — I63312 Cerebral infarction due to thrombosis of left middle cerebral artery: Secondary | ICD-10-CM | POA: Diagnosis not present

## 2019-06-07 DIAGNOSIS — R401 Stupor: Secondary | ICD-10-CM | POA: Diagnosis not present

## 2019-06-07 DIAGNOSIS — G40009 Localization-related (focal) (partial) idiopathic epilepsy and epileptic syndromes with seizures of localized onset, not intractable, without status epilepticus: Secondary | ICD-10-CM | POA: Diagnosis not present

## 2019-06-07 DIAGNOSIS — Z87891 Personal history of nicotine dependence: Secondary | ICD-10-CM

## 2019-06-07 DIAGNOSIS — Z7189 Other specified counseling: Secondary | ICD-10-CM | POA: Diagnosis not present

## 2019-06-07 DIAGNOSIS — R0902 Hypoxemia: Secondary | ICD-10-CM

## 2019-06-07 LAB — STREP PNEUMONIAE URINARY ANTIGEN: Strep Pneumo Urinary Antigen: NEGATIVE

## 2019-06-07 LAB — LACTIC ACID, PLASMA
Lactic Acid, Venous: 2.3 mmol/L (ref 0.5–1.9)
Lactic Acid, Venous: 2.2 mmol/L (ref 0.5–1.9)

## 2019-06-07 LAB — URINALYSIS, COMPLETE (UACMP) WITH MICROSCOPIC
Bilirubin Urine: NEGATIVE
Glucose, UA: NEGATIVE mg/dL
Ketones, ur: NEGATIVE mg/dL
Nitrite: POSITIVE — AB
Protein, ur: 100 mg/dL — AB
Specific Gravity, Urine: 1.018 (ref 1.005–1.030)
WBC, UA: >50 WBC/hpf — ABNORMAL HIGH (ref 0–5)
pH: 5 (ref 5.0–8.0)

## 2019-06-07 LAB — COMPREHENSIVE METABOLIC PANEL
ALT: 17 U/L (ref 0–44)
AST: 29 U/L (ref 15–41)
Albumin: 2.6 g/dL — ABNORMAL LOW (ref 3.5–5.0)
Alkaline Phosphatase: 78 U/L (ref 38–126)
Anion gap: 12 (ref 5–15)
BUN: 24 mg/dL — ABNORMAL HIGH (ref 8–23)
CO2: 31 mmol/L (ref 22–32)
Calcium: 8.9 mg/dL (ref 8.9–10.3)
Chloride: 102 mmol/L (ref 98–111)
Creatinine, Ser: 0.5 mg/dL — ABNORMAL LOW (ref 0.61–1.24)
GFR calc Af Amer: >60 mL/min (ref >60)
GFR calc non Af Amer: >60 mL/min (ref >60)
Glucose, Bld: 112 mg/dL — ABNORMAL HIGH (ref 70–99)
Potassium: 4.6 mmol/L (ref 3.5–5.1)
Sodium: 145 mmol/L (ref 135–145)
Total Bilirubin: 1.1 mg/dL (ref 0.3–1.2)
Total Protein: 5.7 g/dL — ABNORMAL LOW (ref 6.5–8.1)

## 2019-06-07 LAB — CBC WITH DIFFERENTIAL/PLATELET
Abs Immature Granulocytes: 0.04 10*3/uL (ref 0.00–0.07)
Basophils Absolute: 0.1 10*3/uL (ref 0.0–0.1)
Basophils Relative: 1 %
Eosinophils Absolute: 0.1 10*3/uL (ref 0.0–0.5)
Eosinophils Relative: 1 %
HCT: 55.8 % — ABNORMAL HIGH (ref 39.0–52.0)
Hemoglobin: 17.4 g/dL — ABNORMAL HIGH (ref 13.0–17.0)
Immature Granulocytes: 0 %
Lymphocytes Relative: 13 %
Lymphs Abs: 1.4 10*3/uL (ref 0.7–4.0)
MCH: 31.1 pg (ref 26.0–34.0)
MCHC: 31.2 g/dL (ref 30.0–36.0)
MCV: 99.6 fL (ref 80.0–100.0)
Monocytes Absolute: 0.6 10*3/uL (ref 0.1–1.0)
Monocytes Relative: 5 %
Neutro Abs: 8.4 10*3/uL — ABNORMAL HIGH (ref 1.7–7.7)
Neutrophils Relative %: 80 %
Platelets: 192 10*3/uL (ref 150–400)
RBC: 5.6 MIL/uL (ref 4.22–5.81)
RDW: 14.6 % (ref 11.5–15.5)
WBC: 10.5 10*3/uL (ref 4.0–10.5)
nRBC: 0 % (ref 0.0–0.2)

## 2019-06-07 LAB — CBG MONITORING, ED: Glucose-Capillary: 96 mg/dL (ref 70–99)

## 2019-06-07 MED ORDER — SODIUM CHLORIDE 0.9 % IV BOLUS
1000.0000 mL | Freq: Once | INTRAVENOUS | Status: AC
  Administered 2019-06-07: 1000 mL via INTRAVENOUS

## 2019-06-07 MED ORDER — DILTIAZEM HCL 25 MG/5ML IV SOLN
20.0000 mg | Freq: Once | INTRAVENOUS | Status: AC
  Administered 2019-06-07: 20 mg via INTRAVENOUS
  Filled 2019-06-07: qty 5

## 2019-06-07 MED ORDER — SODIUM CHLORIDE 0.9 % IV SOLN
500.0000 mg | INTRAVENOUS | Status: DC
  Administered 2019-06-07 – 2019-06-08 (×2): 500 mg via INTRAVENOUS
  Filled 2019-06-07 (×3): qty 500

## 2019-06-07 MED ORDER — SODIUM CHLORIDE 0.9 % IV BOLUS
500.0000 mL | Freq: Once | INTRAVENOUS | Status: AC
  Administered 2019-06-07: 500 mL via INTRAVENOUS

## 2019-06-07 MED ORDER — LEVOFLOXACIN IN D5W 750 MG/150ML IV SOLN: 750.0000 mg | Freq: Once | INTRAVENOUS | Status: DC

## 2019-06-07 MED ORDER — SODIUM CHLORIDE 0.9 % IV SOLN
2.0000 g | INTRAVENOUS | Status: DC
  Administered 2019-06-07 – 2019-06-08 (×2): 2 g via INTRAVENOUS
  Filled 2019-06-07 (×3): qty 20

## 2019-06-07 NOTE — ED Notes (Signed)
When patient was turned for rectal temp. Patient had moderate size stool. Decubitus to sacral area dressing changed, wound had been packed.

## 2019-06-07 NOTE — ED Notes (Signed)
Condom cath applied

## 2019-06-07 NOTE — ED Notes (Signed)
Lovey Newcomer Mericka/ Daughter [phone] Call with any updates

## 2019-06-07 NOTE — ED Triage Notes (Addendum)
Patient presents  To ED via [facility] from home family states patient has been confused slurred speech for the last 3 weeks , states he was treated for a UTI about 5 weeks ago and last time he acted this way he had a UTI, patient has a condom cath  from home, very dark urine with heavy sediment in the tubing. EMS states home health nurse went out to the house today and told family patient needed to be seen. Per ems family states patient normally is able to carry on a conversation .

## 2019-06-07 NOTE — H&P (Signed)
History and Physical    Leonard Montoya WJX:[id number] DOB: 1941-03-27 DOA: 06/07/2019  PCP: Susy Frizzle, MD   Patient coming from: Home   Chief Complaint: Confusion   HPI: Leonard Montoya is a 78 y.o. male with medical history significant for atrial fibrillation not on anticoagulation, history of CVA, chronic combined systolic and diastolic CHF, and sacral pressure ulcer, now presenting to the emergency department for evaluation increased confusion.  Patient's family reports approximately 3 weeks of increased confusion with slurred speech.  Family notes that he has had similar presentations that have been attributed to urinary tract infections.  He was seen by home health nurse today, was noted to have a condom catheter with significant sediment in the tubing and turbid appearing urine, patient appeared lethargic, and it was recommended that he be brought to the ED for evaluation.  ED Course: Upon arrival to the ED, patient is found to be afebrile, saturating mid 90s on room air, and with stable blood pressure.  EKG features atrial fibrillation.  Noncontrast head CT notable for gliosis in the right cerebellar hemisphere that appears chronic.  Chest x-ray with concern for pneumonia.  Chemistry panel notable for an elevated BUN to creatinine ratio.  CBC features a hemoglobin of 17.4.  Lactic acid is 2.3.  Blood and urine cultures were collected and patient was given a liter of normal saline and antibiotics. COVID-19 screening test not yet resulted.   Review of Systems:  Unable to complete ROS secondary to patient's clinical condition.  Past Medical History:  Diagnosis Date   Allergy 1978   rag weed, dust, animal dander, pollen   Arthritis    Asthma    Atrial fibrillation (HCC)    BPH (benign prostatic hypertrophy)    Chronic insomnia    Gout    Hyperlipidemia    Hypertension    Obesity 1974    History reviewed. No pertinent surgical history.   reports that he quit smoking  about 27 years ago. He has never used smokeless tobacco. He reports that he does not drink alcohol or use drugs.  Allergies  Allergen Reactions   Sudafed [Pseudoephedrine Hcl] Other (See Comments)    Weird Dreams    Family History  Problem Relation Age of Onset   Arthritis Mother    Alcohol abuse Father    Arthritis Father    Diabetes Father    Drug abuse Brother    Learning disabilities Brother      Prior to Admission medications   Medication Sig Start Date End Date Taking? Authorizing Provider  acetaminophen (TYLENOL) 325 MG tablet Take 650 mg by mouth every 6 (six) hours as needed for mild pain.    [provider]  albuterol (PROVENTIL HFA;VENTOLIN HFA) 108 (90 Base) MCG/ACT inhaler Inhale 1-2 puffs into the lungs every 6 (six) hours as needed for wheezing. Patient not taking: Reported on 07/04/2016 09/12/15   Tanna Furry, MD  atorvastatin (LIPITOR) 20 MG tablet Take 1 tablet (20 mg total) by mouth daily at 6 PM. Patient not taking: Reported on 11/21/2017 02/02/17   Caren Griffins, MD  furosemide (LASIX) 40 MG tablet Take 80 mg by mouth daily.     [provider]  liver oil-zinc oxide (DESITIN) 40 % ointment Apply 1 application topically as needed for irritation. 11/24/17   Georgette Shell, MD  metoprolol tartrate (LOPRESSOR) 25 MG tablet Take 1 tablet (25 mg total) by mouth 2 (two) times daily. Patient not taking: Reported on  11/21/2017 02/02/17   Leatha Gilding, MD  QUEtiapine (SEROQUEL) 50 MG tablet Take 1 tablet (50 mg total) by mouth at bedtime. Patient taking differently: Take 50 mg by mouth daily.  07/08/16   Donita Brooks, MD    Physical Exam: Vitals:   06/07/19 1530 06/07/19 1718 06/07/19 1745 06/07/19 1800  BP: (!) 133/99 (!) 139/92 (!) 128/94 133/73  Pulse: 90     Resp:  (!) Temp:      TempSrc:      SpO2: (!) 68%       Constitutional: NAD, calm  Eyes: PERTLA, lids and conjunctivae normal ENMT: Mucous membranes are  dry. Posterior pharynx clear of any exudate or lesions.   Neck: normal, supple, no masses, no thyromegaly Respiratory: no wheezing, no crackles.Mild tachypnea. No accessory muscle use.  Cardiovascular: Rate ~110 and irregularly irregular. Bilateral leg swelling.  Abdomen: No distension, no tenderness, soft. Bowel sounds active.  Musculoskeletal: no clubbing / cyanosis. No joint deformity upper and lower extremities.  Skin: Sacral ulcer. Warm, dry, well-perfused. Neurologic: No gross facial asymmetry. Gross hearing deficit. Dysarthria. Sensation intact. Moving all extremities.  Psychiatric: Alert and oriented to person and place only. Calm, cooperative.    Labs on Admission: I have personally reviewed following labs and imaging studies  CBC: Recent Labs  Lab 06/07/19 1520  WBC 10.5  NEUTROABS 8.4*  HGB 17.4*  HCT 55.8*  MCV 99.6  PLT 192   Basic Metabolic Panel: Recent Labs  Lab 06/07/19 1819  NA 145  K 4.6  CL 102  CO2 31  GLUCOSE 112*  BUN 24*  CREATININE 0.50*  CALCIUM 8.9   GFR: CrCl cannot be calculated (Unknown ideal weight.). Liver Function Tests: Recent Labs  Lab 06/07/19 1819  AST 29  ALT 17  ALKPHOS 78  BILITOT 1.1  PROT 5.7*  ALBUMIN 2.6*   No results for input(s): LIPASE, AMYLASE in the last 168 hours. No results for input(s): AMMONIA in the last 168 hours. Coagulation Profile: No results for input(s): INR, PROTIME in the last 168 hours. Cardiac Enzymes: No results for input(s): CKTOTAL, CKMB, CKMBINDEX, TROPONINI in the last 168 hours. BNP (last 3 results) No results for input(s): PROBNP in the last 8760 hours. HbA1C: No results for input(s): HGBA1C in the last 72 hours. CBG: Recent Labs  Lab 06/07/19 1505  GLUCAP 96   Lipid Profile: No results for input(s): CHOL, HDL, LDLCALC, TRIG, CHOLHDL, LDLDIRECT in the last 72 hours. Thyroid Function Tests: No results for input(s): TSH, T4TOTAL, FREET4, T3FREE, THYROIDAB in the last 72  hours. Anemia Panel: No results for input(s): VITAMINB12, FOLATE, FERRITIN, TIBC, IRON, RETICCTPCT in the last 72 hours. Urine analysis:    Component Value Date/Time   COLORURINE YELLOW 06/07/2019 1953   APPEARANCEUR CLOUDY (A) 06/07/2019 1953   LABSPEC 1.018 06/07/2019 1953   PHURINE 5.0 06/07/2019 1953   GLUCOSEU NEGATIVE 06/07/2019 1953   HGBUR LARGE (A) 06/07/2019 1953   BILIRUBINUR NEGATIVE 06/07/2019 1953   KETONESUR NEGATIVE 06/07/2019 1953   PROTEINUR 100 (A) 06/07/2019 1953   NITRITE POSITIVE (A) 06/07/2019 1953   LEUKOCYTESUR LARGE (A) 06/07/2019 1953   Sepsis Labs: (procalcitonin:4,lacticidven:4) )No results found for this or any previous visit (from the past 240 hour(s)).   Radiological Exams on Admission: Ct Head Wo Contrast  Result Date: 06/07/2019 CLINICAL DATA:  Altered level of consciousness EXAM: CT HEAD WITHOUT CONTRAST TECHNIQUE: Contiguous axial images were obtained from the base of the skull through  the vertex without intravenous contrast. COMPARISON:  CT 01/30/2017 FINDINGS: Brain: Stable gliotic changes related to prior left PCA territory infarction with asymmetric enlargement of the left lateral ventricle, similar to comparison exam. There is a region of gliosis in the superior right cerebellar hemisphere which is also new from comparison but has an overall chronic appearance. Additional lacunar type infarcts are noted in the left basal ganglia, similar to comparison. No evidence of acute infarction, hemorrhage, hydrocephalus, extra-axial collection or mass lesion/mass effect. Symmetric prominence of the ventricles, cisterns and sulci compatible with parenchymal volume loss. Patchy areas of white matter hypoattenuation are most compatible with chronic microvascular angiopathy. Vascular: Atherosclerotic calcification of the carotid siphons and intradural vertebral arteries. No hyperdense vessel. Skull: No calvarial fracture or suspicious osseous lesion. No  scalp swelling or hematoma. Sinuses/Orbits: Paranasal sinuses and mastoid air cells are predominantly clear. Small amount of debris in the right external auditory canal. Included orbital structures are unremarkable. Other: None IMPRESSION: Region of gliosis in the right cerebellar hemisphere is new since 2018 but has a more chronic appearance. No other new or acute intracranial abnormality is seen. Stable gliotic changes related to prior left PCA territory infarction and chronic lacunar type infarcts in the left basal ganglia. Moderate parenchymal volume loss and chronic microvascular angiopathy changes are similar to prior. Electronically Signed   By: Kreg Shropshire M.D.   On: 06/07/2019 16:20   Dg Chest Port 1 View  Result Date: 06/07/2019 CLINICAL DATA:  Altered, confusion and slurred speech for 3 weeks, history of recent UTI EXAM: PORTABLE CHEST 1 VIEW COMPARISON:  Radiograph Nov 23, 2017 FINDINGS: There is bilateral pleural thickening. Portion of the right costophrenic sulcus is collimated from view. No consolidative airspace opacity is seen. Cardiomegaly and prominence of the right heart border is similar to prior likely reflecting the abundant pericardial fat seen on prior CT. Some patchy opacity is noted in the right lower lung and left mid lung. Tracheal tortuosity, similar to prior. Degenerative changes are present in the imaged spine and shoulders. IMPRESSION: 1. Bilateral pleural thickening, could reflect chronic effusions seen on comparison studies. 2. Patchy opacity in the right lower lung and left mid lung, suspicious for pneumonia. 3. Unchanged cardiomegaly. Electronically Signed   By: Kreg Shropshire M.D.   On: 06/07/2019 16:41    EKG: Independently reviewed. Atrial fibrillation, rate 119, PVC, LAFB.   Assessment/Plan   1. Acute encephalopathy  - Presents with 3 weeks of increased confusion and lethargy  - Family reports similar presentation d/t UTI and that is most likely etiology again  this admission  - Continue to treat UTI, continue supportive care, and extend workup if fails to respond to antibiotics and IVF as expected    2. UTI  - Presents with increased confusion and lethargy, family reports similar presentations d/t UTI; urine is turbid and UA compatible with infection  - Blood and urine cultures collected in ED and empiric antibiotics started  - Continue Rocephin pending cultures and clinical course   3. Pneumonia  - Presents with increased confusion and lethargy and noted to have CXR findings concerning for PNA  - Blood cultures were collected in ED  - Check sputum culture, strep pneumo and legionella antigens, COVID-19, and procalcitonin  - Continue treatment with Rocephin and azithromycin    4. Atrial fibrillation  - In atrial fibrillation in ED with rate 90-120  - CHADS-VASc is at least 76 (age x2, CVA x2, CHF) - He is not anticoagulated pta, not  sure if still taking metoprolol, pharmacy med-rec pending   - Continue cardiac monitoring for now, may need rate-control agent if HR persists >110 after IVF  5. Chronic combined systolic & diastolic CHF  - Appears compensated  - Hold Lasix initially, may need more IVF, follow daily wt and I/O's     PPE: Mask, face shield  DVT prophylaxis: Lovenox  Code Status: DNR Family Communication: Discussed with patient  Consults called: None  Admission status: Inpatient. Patient is elderly with significant comorbidity, is at increased risk for life-threatening complications of his pneumonia as reflected in high PORT score and he will require inpatient management.     Leonard Deutscherimothy S Joseangel Nettleton, MD [facility] Pager [phone]  If 7PM-7AM, please contact night-coverage [email or web address] Password [email or web address]  06/07/2019, 8:51 PM

## 2019-06-07 NOTE — ED Provider Notes (Signed)
[facility] Provider Note   CSN: [id number] Arrival date & time: 06/07/19  1412     History   Chief Complaint Chief Complaint  Patient presents with  . Altered Mental Status    HPI Fran LowesJoel A Moisan is a 78 y.o. male.     Patient is an 78 year old male with past medical history of atrial fibrillation, hypertension, hyperlipidemia, and prior urinary tract infections.  Patient recently treated for a bladder infection.  According to home health, the patient has been less responsive for the past several weeks.  He was seen by them today and sent here for further evaluation.  I am told that he behaves this way when he has a urinary tract infection.  Patient does have a condom catheter in place which I am told contained turbid, dark urine.  Denies to me he is experiencing any discomfort or specific symptoms.  The history is provided by the patient.  Altered Mental Status Presenting symptoms: disorientation and lethargy   Severity:  Moderate Episode history:  Continuous Duration:  3 weeks Timing:  Constant Progression:  Worsening Chronicity:  Recurrent   Past Medical History:  Diagnosis Date  . Allergy 1978   rag weed, dust, animal dander, pollen  . Arthritis   . Asthma   . Atrial fibrillation (HCC)   . BPH (benign prostatic hypertrophy)   . Chronic insomnia   . Gout   . Hyperlipidemia   . Hypertension   . Obesity 1974    Patient Active Problem List   Diagnosis Date Noted  . Positive D dimer   . Hypoxia   . Chest pain   . Hypoxemia 11/21/2017  . Aggressive behavior of adult 11/21/2017  . UTI (urinary tract infection) 01/31/2017  . CVA (cerebral vascular accident) (HCC) 01/31/2017  . Right sided weakness 01/30/2017  . Shortness of breath   . Encounter for palliative care   . Goals of care, counseling/discussion   . Pressure ulcer 10/23/2015  . Acute congestive heart failure (HCC)   . Atrial fibrillation with rapid ventricular  response (HCC) 10/21/2015  . Sepsis (HCC) 10/21/2015  . AKI (acute kidney injury) (HCC) 10/21/2015  . Acute CHF (congestive heart failure) (HCC) 10/21/2015  . Thrombocytopenia (HCC) 10/21/2015  . Community acquired pneumonia 10/21/2015  . Gait instability 10/18/2013  . At high risk for falls 09/28/2013  . Chronic insomnia 09/28/2013  . Essential hypertension, benign 09/27/2013  . Atrial fibrillation (HCC) 09/27/2013  . Morbid obesity (HCC) 09/27/2013  . Omphalitis in adult 09/27/2013  . Asthma, chronic 09/27/2013  . Venous stasis dermatitis 09/27/2013  . Other and unspecified hyperlipidemia 09/27/2013    History reviewed. No pertinent surgical history.      Home Medications    Prior to Admission medications   Medication Sig Start Date End Date Taking? Authorizing Provider  acetaminophen (TYLENOL) 325 MG tablet Take 650 mg by mouth every 6 (six) hours as needed for mild pain.    [provider]  albuterol (PROVENTIL HFA;VENTOLIN HFA) 108 (90 Base) MCG/ACT inhaler Inhale 1-2 puffs into the lungs every 6 (six) hours as needed for wheezing. Patient not taking: Reported on 07/04/2016 09/12/15   Rolland PorterJames, Mark, MD  atorvastatin (LIPITOR) 20 MG tablet Take 1 tablet (20 mg total) by mouth daily at 6 PM. Patient not taking: Reported on 11/21/2017 02/02/17   Leatha GildingGherghe, Costin M, MD  furosemide (LASIX) 40 MG tablet Take 80 mg by mouth daily.     [provider]  liver oil-zinc oxide (DESITIN) 40 % ointment Apply 1 application topically as needed for irritation. 11/24/17   Georgette Shell, MD  metoprolol tartrate (LOPRESSOR) 25 MG tablet Take 1 tablet (25 mg total) by mouth 2 (two) times daily. Patient not taking: Reported on 11/21/2017 02/02/17   Caren Griffins, MD  QUEtiapine (SEROQUEL) 50 MG tablet Take 1 tablet (50 mg total) by mouth at bedtime. Patient taking differently: Take 50 mg by mouth daily.  07/08/16   Susy Frizzle, MD    Family History Family History   Problem Relation Age of Onset  . Arthritis Mother   . Alcohol abuse Father   . Arthritis Father   . Diabetes Father   . Drug abuse Brother   . Learning disabilities Brother     Social History Social History   Tobacco Use  . Smoking status: Former Smoker    Quit date: 07/23/1991    Years since quitting: 27.8  . Smokeless tobacco: Never Used  Substance Use Topics  . Alcohol use: No  . Drug use: No     Allergies   Sudafed [pseudoephedrine hcl]   Review of Systems Review of Systems  Unable to perform ROS: Mental status change     Physical Exam Updated Vital Signs BP 136/86 (BP Location: Right Arm)   Pulse (!) 132   Temp 97.8 F (36.6 C) (Rectal)   Resp (!) 24   SpO2 95%   Physical Exam Vitals signs and nursing note reviewed.  Constitutional:      General: He is not in acute distress.    Appearance: He is well-developed. He is not diaphoretic.     Comments: Patient is awake, but somewhat somnolent.  He is a debilitated appearing elderly male.  He will give simple responses to questions that seem appropriate, but does seem somewhat confused.  HENT:     Head: Normocephalic and atraumatic.  Neck:     Musculoskeletal: Normal range of motion and neck supple.  Cardiovascular:     Rate and Rhythm: Tachycardia present. Rhythm irregular.     Heart sounds: No murmur. No friction rub.  Pulmonary:     Effort: Pulmonary effort is normal. No respiratory distress.     Breath sounds: Normal breath sounds. No wheezing or rales.  Abdominal:     General: Bowel sounds are normal. There is no distension.     Palpations: Abdomen is soft.     Tenderness: There is no abdominal tenderness.  Musculoskeletal: Normal range of motion.     Right lower leg: Edema present.     Left lower leg: Edema present.  Skin:    General: Skin is warm and dry.     Comments: There is a sacral decubitus ulcer present.  There is packing in place.  Neurological:     Mental Status: He is oriented to  person, place, and time.     Coordination: Coordination normal.      ED Treatments / Results  Labs (all labs ordered are listed, but only abnormal results are displayed) Labs Reviewed  URINE CULTURE  COMPREHENSIVE METABOLIC PANEL  CBC WITH DIFFERENTIAL/PLATELET  URINALYSIS, COMPLETE (UACMP) WITH MICROSCOPIC  CBG MONITORING, ED    EKG EKG Interpretation  Date/Time:  Monday June 07 2019 14:23:01 EST Ventricular Rate:  119 PR Interval:    QRS Duration: 106 QT Interval:  359 QTC Calculation: 506 R Axis:   -74 Text Interpretation: Atrial fibrillation Ventricular premature complex Left anterior fascicular block Abnormal R-wave progression,  late transition Prolonged QT interval Artifact in lead(s) V1 Confirmed by Vanetta Mulders [phone]) on 06/07/2019 2:26:34 PM   Radiology No results found.  Procedures Procedures (including critical care time)  Medications Ordered in ED Medications  sodium chloride 0.9 % bolus 1,000 mL (has no administration in time range)     Initial Impression / Assessment and Plan / ED Course  I have reviewed the triage vital signs and the nursing notes.  Pertinent labs & imaging results that were available during my care of the patient were reviewed by me and considered in my medical decision making (see chart for details).  Patient is an 78 year old, chronically debilitated male brought for evaluation of altered mental status.  He was seen by home health providers today who felt as though he was not in his usual state of health.  Patient appears confused and does not offer the best history.  I did speak with the daughter who had been here at bedside who also feels as though he is not mentally at his baseline.  I have also told that his condom catheter tubing was extremely contaminated and filled with dark, turbid urine.  He does have findings consistent with a urinary tract infection and chest x-ray which is also suggestive of pneumonia.  Patient  will be given IV Levaquin and I feel as though will require admission to the hospitalist service.  Have spoken with Dr. Antionette Char who agrees to admit.  Final Clinical Impressions(s) / ED Diagnoses   Final diagnoses:  None    ED Discharge Orders    None       Geoffery Lyons, MD 06/07/19 2047

## 2019-06-08 ENCOUNTER — Inpatient Hospital Stay (HOSPITAL_COMMUNITY): Payer: Medicare HMO

## 2019-06-08 DIAGNOSIS — I10 Essential (primary) hypertension: Secondary | ICD-10-CM

## 2019-06-08 DIAGNOSIS — G40009 Localization-related (focal) (partial) idiopathic epilepsy and epileptic syndromes with seizures of localized onset, not intractable, without status epilepticus: Secondary | ICD-10-CM

## 2019-06-08 DIAGNOSIS — R4182 Altered mental status, unspecified: Secondary | ICD-10-CM

## 2019-06-08 DIAGNOSIS — R401 Stupor: Secondary | ICD-10-CM

## 2019-06-08 LAB — CBC WITH DIFFERENTIAL/PLATELET
Abs Immature Granulocytes: 0.05 10*3/uL (ref 0.00–0.07)
Basophils Absolute: 0.1 10*3/uL (ref 0.0–0.1)
Basophils Relative: 1 %
Eosinophils Absolute: 0 10*3/uL (ref 0.0–0.5)
Eosinophils Relative: 0 %
HCT: 51 % (ref 39.0–52.0)
Hemoglobin: 15.9 g/dL (ref 13.0–17.0)
Immature Granulocytes: 0 %
Lymphocytes Relative: 16 %
Lymphs Abs: 1.8 10*3/uL (ref 0.7–4.0)
MCH: 31.7 pg (ref 26.0–34.0)
MCHC: 31.2 g/dL (ref 30.0–36.0)
MCV: 101.6 fL — ABNORMAL HIGH (ref 80.0–100.0)
Monocytes Absolute: 0.7 10*3/uL (ref 0.1–1.0)
Monocytes Relative: 6 %
Neutro Abs: 8.7 10*3/uL — ABNORMAL HIGH (ref 1.7–7.7)
Neutrophils Relative %: 77 %
Platelets: 185 10*3/uL (ref 150–400)
RBC: 5.02 MIL/uL (ref 4.22–5.81)
RDW: 14.9 % (ref 11.5–15.5)
WBC: 11.3 10*3/uL — ABNORMAL HIGH (ref 4.0–10.5)
nRBC: 0 % (ref 0.0–0.2)

## 2019-06-08 LAB — BASIC METABOLIC PANEL
Anion gap: 11 (ref 5–15)
BUN: 20 mg/dL (ref 8–23)
CO2: 30 mmol/L (ref 22–32)
Calcium: 9.3 mg/dL (ref 8.9–10.3)
Chloride: 106 mmol/L (ref 98–111)
Creatinine, Ser: 0.67 mg/dL (ref 0.61–1.24)
GFR calc Af Amer: >60 mL/min (ref >60)
GFR calc non Af Amer: >60 mL/min (ref >60)
Glucose, Bld: 105 mg/dL — ABNORMAL HIGH (ref 70–99)
Potassium: 4 mmol/L (ref 3.5–5.1)
Sodium: 147 mmol/L — ABNORMAL HIGH (ref 135–145)

## 2019-06-08 LAB — BLOOD GAS, ARTERIAL
Acid-Base Excess: 5.5 mmol/L — ABNORMAL HIGH (ref 0.0–2.0)
Bicarbonate: 31.5 mmol/L — ABNORMAL HIGH (ref 20.0–28.0)
Drawn by: 57060
FIO2: 36
O2 Saturation: 98.6 %
Patient temperature: 36.5
pCO2 arterial: 63.4 mmHg — ABNORMAL HIGH (ref 32.0–48.0)
pH, Arterial: 7.314 — ABNORMAL LOW (ref 7.350–7.450)
pO2, Arterial: 137 mmHg — ABNORMAL HIGH (ref 83.0–108.0)

## 2019-06-08 LAB — LACTIC ACID, PLASMA
Lactic Acid, Venous: 1.7 mmol/L (ref 0.5–1.9)
Lactic Acid, Venous: 3.9 mmol/L (ref 0.5–1.9)

## 2019-06-08 LAB — PROCALCITONIN: Procalcitonin: <0.1 ng/mL

## 2019-06-08 LAB — SARS CORONAVIRUS 2 (TAT 6-24 HRS): SARS Coronavirus 2: NEGATIVE

## 2019-06-08 MED ORDER — DEXTROSE-NACL 5-0.45 % IV SOLN
INTRAVENOUS | Status: AC
  Administered 2019-06-08: 18:00:00 via INTRAVENOUS

## 2019-06-08 MED ORDER — SODIUM CHLORIDE 0.9% FLUSH
3.0000 mL | Freq: Two times a day (BID) | INTRAVENOUS | Status: DC
  Administered 2019-06-08 – 2019-06-10 (×4): 3 mL via INTRAVENOUS

## 2019-06-08 MED ORDER — METOPROLOL TARTRATE 5 MG/5ML IV SOLN: 2.5000 mg | Freq: Four times a day (QID) | INTRAVENOUS | Status: DC

## 2019-06-08 MED ORDER — ONDANSETRON HCL 4 MG PO TABS: 4.0000 mg | ORAL_TABLET | Freq: Four times a day (QID) | ORAL | Status: DC | PRN

## 2019-06-08 MED ORDER — METOPROLOL TARTRATE 5 MG/5ML IV SOLN
5.0000 mg | Freq: Once | INTRAVENOUS | Status: AC
  Administered 2019-06-08: 5 mg via INTRAVENOUS
  Filled 2019-06-08: qty 5

## 2019-06-08 MED ORDER — METOPROLOL TARTRATE 5 MG/5ML IV SOLN
2.5000 mg | Freq: Four times a day (QID) | INTRAVENOUS | Status: DC
  Administered 2019-06-08 – 2019-06-09 (×2): 2.5 mg via INTRAVENOUS
  Filled 2019-06-08 (×3): qty 5

## 2019-06-08 MED ORDER — ACETAMINOPHEN 325 MG PO TABS: 650.0000 mg | ORAL_TABLET | Freq: Four times a day (QID) | ORAL | Status: DC | PRN

## 2019-06-08 MED ORDER — ONDANSETRON HCL 4 MG/2ML IJ SOLN: 4.0000 mg | Freq: Four times a day (QID) | INTRAMUSCULAR | Status: DC | PRN

## 2019-06-08 MED ORDER — POLYETHYLENE GLYCOL 3350 17 G PO PACK: 17.0000 g | PACK | Freq: Every day | ORAL | Status: DC | PRN

## 2019-06-08 MED ORDER — ACETAMINOPHEN 650 MG RE SUPP: 650.0000 mg | Freq: Four times a day (QID) | RECTAL | Status: DC | PRN

## 2019-06-08 MED ORDER — SODIUM CHLORIDE 0.9 % IV SOLN
250.0000 mL | INTRAVENOUS | Status: DC | PRN
  Administered 2019-06-10: 250 mL via INTRAVENOUS

## 2019-06-08 MED ORDER — SODIUM CHLORIDE 0.9% FLUSH: 3.0000 mL | INTRAVENOUS | Status: DC | PRN

## 2019-06-08 MED ORDER — LORAZEPAM 2 MG/ML IJ SOLN
2.0000 mg | INTRAMUSCULAR | Status: DC | PRN
  Administered 2019-06-09 – 2019-06-10 (×2): 2 mg via INTRAVENOUS
  Filled 2019-06-08 (×3): qty 1

## 2019-06-08 MED ORDER — SODIUM CHLORIDE 0.9 % IV SOLN
250.0000 mg | Freq: Two times a day (BID) | INTRAVENOUS | Status: DC
  Administered 2019-06-08 – 2019-06-10 (×5): 250 mg via INTRAVENOUS
  Filled 2019-06-08 (×9): qty 2.5

## 2019-06-08 MED ORDER — ENOXAPARIN SODIUM 40 MG/0.4ML ~~LOC~~ SOLN
40.0000 mg | SUBCUTANEOUS | Status: DC
  Administered 2019-06-08 – 2019-06-09 (×3): 40 mg via SUBCUTANEOUS
  Filled 2019-06-08 (×3): qty 0.4

## 2019-06-08 MED ORDER — HYDROCERIN EX CREA
TOPICAL_CREAM | Freq: Two times a day (BID) | CUTANEOUS | Status: DC
  Administered 2019-06-08 – 2019-06-09 (×4): via TOPICAL
  Filled 2019-06-08 (×2): qty 113

## 2019-06-08 MED ORDER — HYDROCODONE-ACETAMINOPHEN 5-325 MG PO TABS: 1.0000 | ORAL_TABLET | Freq: Four times a day (QID) | ORAL | Status: DC | PRN

## 2019-06-08 NOTE — Consult Note (Signed)
Neurology Consultation Reason for Consult: Seizure Referring Physician: Dr. Jacquelin Hawking  CC: Confusion  History is obtained from: Chart review  HPI: Leonard Montoya is a 78 y.o. male with history of atrial fibrillation not on anticoagulation, left PCA stroke, chronic systolic and diastolic congestive heart failure and sacral pressure ulcer who presented with altered mental status yesterday.  Patient was diagnosed with UTI and started on ceftriaxone.  This morning team noted that patient was more confused than yesterday and gazing to the left with mild tremoring.  EEG was ordered to evaluate for seizures which showed left parieto-occipital spikes.  Therefore neurology was consulted for management.   FVC:BSWHQP to obtain due to altered mental status.   Past Medical History:  Diagnosis Date  . Allergy 1978   rag weed, dust, animal dander, pollen  . Arthritis   . Asthma   . Atrial fibrillation (HCC)   . BPH (benign prostatic hypertrophy)   . Chronic insomnia   . Gout   . Hyperlipidemia   . Hypertension   . Obesity 1974    Family History  Problem Relation Age of Onset  . Arthritis Mother   . Alcohol abuse Father   . Arthritis Father   . Diabetes Father   . Drug abuse Brother   . Learning disabilities Brother     Social History: Per chart review,he quit smoking about 27 years ago. He has never used smokeless tobacco. He reports that he does not drink alcohol or use drugs.  Exam: Current vital signs: BP 126/87 (BP Location: Right Arm)   Pulse 97   Temp 98.6 F (37 C) (Axillary)   Resp 15   SpO2 97%  Vital signs in last 24 hours: Temp:  [97.5 F (36.4 C)-98.6 F (37 C)] 98.6 F (37 C) (11/17 1111) Pulse Rate:  [73-132] 97 (11/17 1111) Resp:  [13-31] 15 (11/17 1111) BP: (110-140)/(68-99) 126/87 (11/17 1111) SpO2:  [94 %-100 %] 97 % (11/17 1111)   Physical Exam  Constitutional: Laying in bed, not in apparent distress Psych: Unable to evaluate due to altered mental  status Eyes: PERRLA, no scleral injection HENT: No OP obstrucion Head: Normocephalic.  Cardiovascular: Tachycardic, irregular rate and rhythm Respiratory: Effort normal, non-labored breathing GI: Soft.  No distension. There is no tenderness.  Skin: Bilateral lower extremity skin discoloration and edema  Neuro: Mental Status: Opens eyes to repeated tactile stimuli, does not follow commands, does not track  Cranial Nerves: PERRLA, oculocephalic reflex intact, corneal reflex intact, does not blink to threat, no apparent facial asymmetry, rest of the cranial nerves unable to assess secondary to altered mental status. Motor: Withdraws to noxious stimuli in all 4 extremities Deep Tendon Reflexes:1+ and symmetric in the biceps and patellae.  Cerebellar: Unable to assess secondary to altered mental status.  I have reviewed labs in epic and the results pertinent to this consultation are: WBC 11.3 sodium 147 Urine culture more than 100,000 gram-negative rods  I have reviewed the images obtained: CT head 11/60/2020: Region of gliosis in the right cerebellar hemisphere is new since 2018 but has a more chronic appearance. No other new or acute intracranial abnormality is seen.  Stable gliotic changes related to prior left PCA territory infarction and chronic lacunar type infarcts in the left basal ganglia.  Moderate parenchymal volume loss and chronic microvascular angiopathy changes are similar to prior.  Assessment and plan: 78 year old male with atrial fibrillation not on anticoagulation who presented with worsening mental status.    Epilepsy Chronic  left PCA stroke Encephalopathy -Routine EEG showed frequent left parieto-occipital spikes. -Patient has had prior left parieto-occipital stroke.  In the setting of UTI patient likely had lowered seizure threshold -Encephalopathy is likely secondary to hypernatremia, UTI and possible seizure  Recommendations -We will start Keppra 250 mg  twice daily.  If needed, this dose can be further increased to 500 mg twice daily. -Continue management of UTI and hypernatremia per primary team -Low suspicion for meningitis contributing to encephalopathy at this point due to absence of nuchal rigidity and gradual decline in mental status.  Therefore will hold off on lumbar puncture. -Seizure precautions -As needed IV Ativan 2 mg for generalized tonic-clonic seizure lasting more than 2 minutes or focal seizure lasting more than 5 minutes  Thank you for allowing [place] to participate in the care of this patient.  Please page the neuro hospitalist after 5 PM.  Marcelle Overlie [name]

## 2019-06-08 NOTE — Progress Notes (Signed)
Pt HR elevated in 140s- 150s. O2 sats dropping in 80s but comes back up to 90s. Pt lethargic and unable to follow commands. Lab unable to get labs at this time. Dr. Lonny Prude called and at bedside. Nurse will continue to monitor. [place]

## 2019-06-08 NOTE — Progress Notes (Signed)
CRITICAL VALUE ALERT  Critical Value:  Lactic Acid 3.9  Date & Time Notied: 06/08/2019 1031  Provider Notified: Dr. Lonny Prude  Orders Received/Actions taken: awaiting orders

## 2019-06-08 NOTE — Progress Notes (Signed)
PROGRESS NOTE    Leonard Montoya  TUU:[id number] DOB: Jul 20, 1941 DOA: 06/07/2019 PCP: Donita Brooks, MD   Brief Narrative: Leonard Montoya is a 78 y.o. male with medical history significant for atrial fibrillation not on anticoagulation, history of CVA, chronic combined systolic and diastolic CHF, and sacral pressure ulcer. He presented secondary to confusion with concern for UTI/pneumonia. Started on antibiotics.   Assessment & Plan:   Active Problems:   Essential hypertension, benign   Asthma, chronic   Atrial fibrillation with rapid ventricular response (HCC)   Community acquired pneumonia   Pressure ulcer   UTI (urinary tract infection)   CVA (cerebral vascular accident) (HCC)   Acute encephalopathy   Chronic combined systolic and diastolic CHF (congestive heart failure) (HCC)   Altered mental status Thought metabolic encephalopathy secondary to UTI. Significant worsening this morning. Patient not interactive, gazing left but sometimes will track. Mild tremor but no jerking noted. CT scan from yesterday without evidence of acute process. Concern that patient may be having a seizure (?focal). Elevated lactic acid possibly related to seizure rather than hypoperfusion. -Stat EEG (neurology has expedited this, thankfully) -Neurology consult -Neuro checks  UTI Patient gets recurrent UTIs. Concern mental status is secondary to UTI. Urine culture pending. Started on Ceftriaxone. -Continue Ceftriaxone -Await urine culture  Community acquired pneumonia Concern on imaging. Empirically started on Ceftriaxone and azithromycin. -Continue Ceftriaxone/azithromyicn for now -Procalcitonin pending  Atrial fibrillation with RVR Possibly paroxysmal. No history. Associated RVR -Metoprolol 5 mg IV x1 today -Continue telemetry  Chronic combined systolic and diastolic heart failure Stable. LE edema but otherwise does not appear to be in acute exacerbation. Lasix held on admission -Daily  weights and strict in out -Will need to restart lasix at some point, will monitor for resumption  History of CVA Patient is bed bound and followed by palliative care. History of left PCA territory stroke.  Pressure injury Medial sacrum, POA   DVT prophylaxis: Lovenox Code Status:   Code Status: DNR Family Communication: Attempted to call daughter with no answer Disposition Plan: Discharge pending continued medical management   Consultants:   Neurology  Procedures:   EEG pending  Antimicrobials:  Ceftriaxone  Azithromycin    Subjective: Nurse called concerned about patient's mental status at bedside evaluation. Came to evaluate patient at bedside. Unresponsive to my questions at time of interview which appears to be new from baseline and even admission.  Objective: Vitals:   06/08/19 0216 06/08/19 0233 06/08/19 0340 06/08/19 0433  BP: (!) 136/96 123/79 (!) 117/91 140/90  Pulse: (!) 120  73   Resp:   (!) 22 (!) 22  Temp: (!) 97.5 F (36.4 C)  98.4 F (36.9 C)   TempSrc: Oral  Oral   SpO2:  97% 94% 97%    Intake/Output Summary (Last 24 hours) at 06/08/2019 0948 Last data filed at 06/07/2019 2243 Gross per 24 hour  Intake 1250 ml  Output -  Net 1250 ml   There were no vitals filed for this visit.  Examination:  General exam: Appears calm and comfortable Respiratory system: Clear to auscultation. Respiratory effort normal. Cardiovascular system: S1 & S2 heard, RRR. No murmurs, rubs, gallops or clicks. Gastrointestinal system: Abdomen is nondistended, soft and nontender. No organomegaly or masses felt. Normal bowel sounds heard. Central nervous system: Alert at times. Tracks me through the room sometimes, but then with a blank gaze. At times he has a leftward gaze that cannot be broken. Blinks to threat. Does not respond  to noxious stimuli bilaterally and is not responding to commands. Extremities: 2+ BLE edema. No calf tenderness Skin: No cyanosis. No rashes  Psychiatry: Judgement and insight appear impaired.    Data Reviewed: I have personally reviewed following labs and imaging studies  CBC: Recent Labs  Lab 06/07/19 1520  WBC 10.5  NEUTROABS 8.4*  HGB 17.4*  HCT 55.8*  MCV 99.6  PLT 192   Basic Metabolic Panel: Recent Labs  Lab 06/07/19 1819  NA 145  K 4.6  CL 102  CO2 31  GLUCOSE 112*  BUN 24*  CREATININE 0.50*  CALCIUM 8.9   GFR: CrCl cannot be calculated (Unknown ideal weight.). Liver Function Tests: Recent Labs  Lab 06/07/19 1819  AST 29  ALT 17  ALKPHOS 78  BILITOT 1.1  PROT 5.7*  ALBUMIN 2.6*   No results for input(s): LIPASE, AMYLASE in the last 168 hours. No results for input(s): AMMONIA in the last 168 hours. Coagulation Profile: No results for input(s): INR, PROTIME in the last 168 hours. Cardiac Enzymes: No results for input(s): CKTOTAL, CKMB, CKMBINDEX, TROPONINI in the last 168 hours. BNP (last 3 results) No results for input(s): PROBNP in the last 8760 hours. HbA1C: No results for input(s): HGBA1C in the last 72 hours. CBG: Recent Labs  Lab 06/07/19 1505  GLUCAP 96   Lipid Profile: No results for input(s): CHOL, HDL, LDLCALC, TRIG, CHOLHDL, LDLDIRECT in the last 72 hours. Thyroid Function Tests: No results for input(s): TSH, T4TOTAL, FREET4, T3FREE, THYROIDAB in the last 72 hours. Anemia Panel: No results for input(s): VITAMINB12, FOLATE, FERRITIN, TIBC, IRON, RETICCTPCT in the last 72 hours. Sepsis Labs: Recent Labs  Lab 06/07/19 1521 06/07/19 1645 06/08/19 0218  LATICACIDVEN 2.3* 2.2* 1.7    Recent Results (from the past 240 hour(s))  SARS CORONAVIRUS 2 (TAT 6-24 HRS)     Status: None   Collection Time: 06/07/19  8:58 PM  Result Value Ref Range Status   SARS Coronavirus 2 NEGATIVE NEGATIVE Final    Comment: (NOTE) SARS-CoV-2 target nucleic acids are NOT DETECTED. The SARS-CoV-2 RNA is generally detectable in upper and lower respiratory specimens during the acute phase of  infection. Negative results do not preclude SARS-CoV-2 infection, do not rule out co-infections with other pathogens, and should not be used as the sole basis for treatment or other patient management decisions. Negative results must be combined with clinical observations, patient history, and epidemiological information. The expected result is Negative. Fact Sheet for Patients: HairSlick.nohttps://www.fda.gov/media/138098/download Fact Sheet for Healthcare Providers: quierodirigir.comhttps://www.fda.gov/media/138095/download This test is not yet approved or cleared by the [place] FDA and  has been authorized for detection and/or diagnosis of SARS-CoV-2 by FDA under an Emergency Use Authorization (EUA). This EUA will remain  in effect (meaning this test can be used) for the duration of the COVID-19 declaration under Section 56 4(b)(1) of the Act, 21 U.S.C. section 360bbb-3(b)(1), unless the authorization is terminated or revoked sooner. Performed at [facility], [address] [place], [place] [address]          Radiology Studies: Ct Head Wo Contrast  Result Date: 06/07/2019 CLINICAL DATA:  Altered level of consciousness EXAM: CT HEAD WITHOUT CONTRAST TECHNIQUE: Contiguous axial images were obtained from the base of the skull through the vertex without intravenous contrast. COMPARISON:  CT 01/30/2017 FINDINGS: Brain: Stable gliotic changes related to prior left PCA territory infarction with asymmetric enlargement of the left lateral ventricle, similar to comparison exam. There is a region of gliosis  in the superior right cerebellar hemisphere which is also new from comparison but has an overall chronic appearance. Additional lacunar type infarcts are noted in the left basal ganglia, similar to comparison. No evidence of acute infarction, hemorrhage, hydrocephalus, extra-axial collection or mass lesion/mass effect. Symmetric prominence of the ventricles, cisterns and sulci compatible with parenchymal  volume loss. Patchy areas of white matter hypoattenuation are most compatible with chronic microvascular angiopathy. Vascular: Atherosclerotic calcification of the carotid siphons and intradural vertebral arteries. No hyperdense vessel. Skull: No calvarial fracture or suspicious osseous lesion. No scalp swelling or hematoma. Sinuses/Orbits: Paranasal sinuses and mastoid air cells are predominantly clear. Small amount of debris in the right external auditory canal. Included orbital structures are unremarkable. Other: None IMPRESSION: Region of gliosis in the right cerebellar hemisphere is new since 2018 but has a more chronic appearance. No other new or acute intracranial abnormality is seen. Stable gliotic changes related to prior left PCA territory infarction and chronic lacunar type infarcts in the left basal ganglia. Moderate parenchymal volume loss and chronic microvascular angiopathy changes are similar to prior. Electronically Signed   By: Lovena Le M.D.   On: 06/07/2019 16:20   Dg Chest Port 1 View  Result Date: 06/07/2019 CLINICAL DATA:  Altered, confusion and slurred speech for 3 weeks, history of recent UTI EXAM: PORTABLE CHEST 1 VIEW COMPARISON:  Radiograph Nov 23, 2017 FINDINGS: There is bilateral pleural thickening. Portion of the right costophrenic sulcus is collimated from view. No consolidative airspace opacity is seen. Cardiomegaly and prominence of the right heart border is similar to prior likely reflecting the abundant pericardial fat seen on prior CT. Some patchy opacity is noted in the right lower lung and left mid lung. Tracheal tortuosity, similar to prior. Degenerative changes are present in the imaged spine and shoulders. IMPRESSION: 1. Bilateral pleural thickening, could reflect chronic effusions seen on comparison studies. 2. Patchy opacity in the right lower lung and left mid lung, suspicious for pneumonia. 3. Unchanged cardiomegaly. Electronically Signed   By: Lovena Le M.D.    On: 06/07/2019 16:41        Scheduled Meds: . enoxaparin (LOVENOX) injection  40 mg Subcutaneous Q24H  . hydrocerin   Topical BID  . sodium chloride flush  3 mL Intravenous Q12H  . sodium chloride flush  3 mL Intravenous Q12H   Continuous Infusions: . sodium chloride    . azithromycin Stopped (06/07/19 2243)  . cefTRIAXone (ROCEPHIN)  IV Stopped (06/07/19 2130)     LOS: 1 day   40 minutes spent in critical care time coordinating care with neurology, nurse. Ordering labs/procedures, bedside evaluation.   Cordelia Poche, MD [facility] 06/08/2019, 9:48 AM  If 7PM-7AM, please contact night-coverage [email or web address]

## 2019-06-08 NOTE — Progress Notes (Signed)
Pt HR elevated in 120s-130s. O2 sats dropping in lower 80s. Dr. Lonny Prude notified. Metoprolol ordered. Nurse will continue to monitor. [place]

## 2019-06-08 NOTE — Procedures (Signed)
Patient Name: Leonard Montoya  MRN: [id number]  Epilepsy Attending: Lora Havens  Referring Physician/Provider: Dr. Cordelia Poche Date: 06/08/2019 Duration: 25.20 minutes  Patient history: 78 year old male with left PCA stroke.  EEG to evaluate for seizures.  Level of alertness: Awake, drowsy  AEDs during EEG study: None  Technical aspects: This EEG study was done with scalp electrodes positioned according to the 10-20 International system of electrode placement. Electrical activity was acquired at a sampling rate of 500Hz  and reviewed with a high frequency filter of 70Hz  and a low frequency filter of 1Hz . EEG data were recorded continuously and digitally stored.   Description: During awake state, no clear posterior dominant rhythm was seen.  During drowsiness, very frequent left parieto-occipital sharp waves were seen.  EEG also showed continuous generalized 3 to 5 Hz theta-delta slowing, maximal left parieto-occipital.  Hyperventilation and photic stimulation were not performed.  Abnormality -Sharp waves, left parieto-occipital -Continuous slow, generalized and maximal left parieto-occipital   IMPRESSION: This study showed evidence of potential epileptogenicity and cortical dysfunction in left parieto-occipital region.  Additionally there is evidence of mild to moderate diffuse encephalopathy, nonspecific etiology. No seizures were seen throughout the recording.

## 2019-06-08 NOTE — Consult Note (Addendum)
[facility] Nurse Consult Note: Reason for Consult: Consult requested for sacrum wound.  [facility] team is working remotely today; reviewed progress notes in the EMR.  Pt is noted to have a chronic stage 4 pressure injury which is present on admission, according to the nursing flow sheet. Pt is noted to be frequently incontinent prior to admission and it is difficult to keep the location from becoming soiled.  Pressure Injury POA: Yes Dressing procedure/placement/frequency: Topical treatment orders provided for bedside nurses to perform daily: Moist gauze dressing to sacrum, then cover with foam dressing.    Later note 0900 for wound assessment:  Wound had loose layer of slough which removed easily when cleansed; revealing 90% red, 10% yellow wound with bone palpable; chronic stage 4 pressure injury with mod amt tan drainage, no odor.  Continue plan of care as outlined above.   Please re-consult if further assistance is needed.  Thank-you,  Julien Girt MSN, [place], [place], [place], [place]

## 2019-06-08 NOTE — Progress Notes (Signed)
STAT EEG complete - results pending. ? ?

## 2019-06-08 NOTE — Evaluation (Signed)
Clinical/Bedside Swallow Evaluation Patient Details  Name: Leonard Montoya MRN: [id number] Date of Birth: [birth year]  Today's Date: 06/08/2019 Time: SLP Start Time (ACUTE ONLY): 1202 SLP Stop Time (ACUTE ONLY): 1219 SLP Time Calculation (min) (ACUTE ONLY): 17 min  Past Medical History:  Past Medical History:  Diagnosis Date  . Allergy 1978   rag weed, dust, animal dander, pollen  . Arthritis   . Asthma   . Atrial fibrillation (HCC)   . BPH (benign prostatic hypertrophy)   . Chronic insomnia   . Gout   . Hyperlipidemia   . Hypertension   . Obesity 1974   Past Surgical History: History reviewed. No pertinent surgical history. HPI:  Leonard Montoya is a 78 y.o. male with medical history significant for atrial fibrillation not on anticoagulation, history of CVA, chronic combined systolic and diastolic CHF, and sacral pressure ulcer. He presented secondary to confusion with concern for UTI/pneumonia. Started on antibiotics. EEG abnormal.  CXR concerning for pneumonia   Assessment / Plan / Recommendation Clinical Impression  Pt asleep on SLP arrival, but able to rouse.  After sternal rub pt opened eyes and remained alert throughout assessment; however, he did not follow directions or attempt to verbalize.  Per chart review, pt can hold conversation at baseline.  Vital readings inconsistent.  Nursing assisted with SpO2 sensor.  Pt tolerated puree and regular solid textures.  Exhibited excellent oral clearance of puree.  Slightly prolonged mastication noted with regular solids.  WIth thin liquid pt required 3 swallows per bolus, but there were no other indicators of pharyngeal dysphagia. Trials of additional consistencies were aborted as neuro evaluation came back concerning for ongoing seizure activity.  Will hold additional PO trials at present. Recommend holding POs pending neuro status. SLP Visit Diagnosis: Dysphagia, unspecified (R13.10)    Aspiration Risk       Diet Recommendation NPO    Medication Administration: Via alternative means    Other  Recommendations     Follow up Recommendations 24 hour supervision/assistance      Frequency and Duration min 2x/week  2 weeks       Prognosis   TBD     Swallow Study   General HPI: Leonard Montoya is a 78 y.o. male with medical history significant for atrial fibrillation not on anticoagulation, history of CVA, chronic combined systolic and diastolic CHF, and sacral pressure ulcer. He presented secondary to confusion with concern for UTI/pneumonia. Started on antibiotics. EEG abnormal.  CXR concerning for pneumonia Type of Study: Bedside Swallow Evaluation Previous Swallow Assessment: During prior admissions with recommendations for regular/thin Diet Prior to this Study: Regular;Thin liquids Temperature Spikes Noted: No Respiratory Status: Nasal cannula History of Recent Intubation: No Behavior/Cognition: Alert;Doesn't follow directions Oral Cavity Assessment: Within Functional Limits Oral Care Completed by SLP: No Oral Cavity - Dentition: Adequate natural dentition Self-Feeding Abilities: Needs assist Patient Positioning: Upright in bed Volitional Cough: Cognitively unable to elicit Volitional Swallow: Unable to elicit    Oral/Motor/Sensory Function Overall Oral Motor/Sensory Function: (Unable to test)   Ice Chips Ice chips: Not tested   Thin Liquid Thin Liquid: Impaired Presentation: Cup;Spoon Pharyngeal  Phase Impairments: Multiple swallows    Nectar Thick Nectar Thick Liquid: Not tested   Honey Thick Honey Thick Liquid: Not tested   Puree Puree: Within functional limits Presentation: Spoon   Solid     Solid: Within functional limits      Kerrie Pleasure, MA, CCC-SLP Acute Rehabilitation Services Office: [phone]; Pager [phone]): [phone] (  by text page app only) 06/08/2019,12:32 PM

## 2019-06-09 ENCOUNTER — Inpatient Hospital Stay (HOSPITAL_COMMUNITY): Payer: Medicare HMO

## 2019-06-09 DIAGNOSIS — R4182 Altered mental status, unspecified: Secondary | ICD-10-CM

## 2019-06-09 DIAGNOSIS — I63312 Cerebral infarction due to thrombosis of left middle cerebral artery: Secondary | ICD-10-CM

## 2019-06-09 LAB — URINE CULTURE: Culture: >=100000 — AB

## 2019-06-09 LAB — BASIC METABOLIC PANEL
Anion gap: 12 (ref 5–15)
BUN: 25 mg/dL — ABNORMAL HIGH (ref 8–23)
CO2: 26 mmol/L (ref 22–32)
Calcium: 9 mg/dL (ref 8.9–10.3)
Chloride: 108 mmol/L (ref 98–111)
Creatinine, Ser: 0.56 mg/dL — ABNORMAL LOW (ref 0.61–1.24)
GFR calc Af Amer: >60 mL/min (ref >60)
GFR calc non Af Amer: >60 mL/min (ref >60)
Glucose, Bld: 121 mg/dL — ABNORMAL HIGH (ref 70–99)
Potassium: 4.3 mmol/L (ref 3.5–5.1)
Sodium: 146 mmol/L — ABNORMAL HIGH (ref 135–145)

## 2019-06-09 LAB — LEGIONELLA PNEUMOPHILA SEROGP 1 UR AG: L. pneumophila Serogp 1 Ur Ag: NEGATIVE

## 2019-06-09 LAB — PROCALCITONIN: Procalcitonin: <0.1 ng/mL

## 2019-06-09 LAB — LACTIC ACID, PLASMA: Lactic Acid, Venous: 1.2 mmol/L (ref 0.5–1.9)

## 2019-06-09 MED ORDER — DEXTROSE-NACL 5-0.45 % IV SOLN
INTRAVENOUS | Status: DC
  Administered 2019-06-09 (×2): via INTRAVENOUS

## 2019-06-09 MED ORDER — LORAZEPAM 2 MG/ML IJ SOLN
2.0000 mg | Freq: Once | INTRAMUSCULAR | Status: AC
  Administered 2019-06-09: 2 mg via INTRAVENOUS

## 2019-06-09 MED ORDER — IOHEXOL 350 MG/ML SOLN
50.0000 mL | Freq: Once | INTRAVENOUS | Status: AC | PRN
  Administered 2019-06-09: 50 mL via INTRAVENOUS

## 2019-06-09 MED ORDER — METOPROLOL TARTRATE 5 MG/5ML IV SOLN
5.0000 mg | Freq: Four times a day (QID) | INTRAVENOUS | Status: DC
  Administered 2019-06-09: 2.5 mg via INTRAVENOUS
  Administered 2019-06-09 – 2019-06-10 (×3): 5 mg via INTRAVENOUS
  Filled 2019-06-09 (×4): qty 5

## 2019-06-09 MED ORDER — LEVOFLOXACIN IN D5W 500 MG/100ML IV SOLN
500.0000 mg | INTRAVENOUS | Status: DC
  Administered 2019-06-09: 500 mg via INTRAVENOUS
  Filled 2019-06-09: qty 100

## 2019-06-09 MED ORDER — AMOXICILLIN 500 MG PO CAPS: 500.0000 mg | ORAL_CAPSULE | Freq: Two times a day (BID) | ORAL | Status: DC

## 2019-06-09 NOTE — Progress Notes (Addendum)
SLP Cancellation Note  Patient Details Name: Leonard Montoya MRN: [id number] DOB: 1940-09-02   Cancelled treatment:        Attempted to see pt for ongoing dysphagia assessment/treatment.  Pt transporting off floor for CT.  Will reattempt as schedule permits.  ADDENDUM 11:57: Reattmped swallow evaluation.  Pt returned from CT, but set up for EEG ongoing.  SLP will follow up tomorrow. No acute findings on CT, MRI ordered. If there is concern for ongoing seizure activity, consider making pt NPO pending neuro status and/or re-evaluation by SLP.   Celedonio Savage, [place], [place] Acute Rehabilitation Services Office: [phone]  06/09/2019, 9:12 AM

## 2019-06-09 NOTE — Progress Notes (Signed)
LTM EEG reviewed from 1108 to 1328.  EEG continues to show frequent left parieto-occipital sharp waves as well as continuous generalized 3 to 5 Hz theta delta slowing, maximal in left parieto-occipital region.  At times, the slowing in left parieto-occipital region appears rhythmic without clear evolution and is probably on the ictal-interictal continuum.  Given patient's altered mental status, we will do an Ativan challenge to see if the rhythmic activity resolves.  Paulanthony Gleaves [name]

## 2019-06-09 NOTE — Progress Notes (Signed)
BP=91/63, 94/72 and HR fluctuating in the 110 s to 120 s. Metoprolol 2.5 mg IV held this morning.  Notified Baltazar Najjar NP without any new order. Will continue to monitor.

## 2019-06-09 NOTE — Significant Event (Signed)
NP called by RRRN who had responded to pt's room due to SaO2 being in the 60s. RRRN witness seizure activity described as shaking of LEs and arms. No head shaking. Per pt's RN, pt was unresponsive earlier in the shift which has apparently been the same (?). Neuro charted that pt opened eyes today to tactile stimulation, but was non verbal and did not follow commands. Withdrew to pain in some extremities per neuro as well. (Hx Stroke). "Sleepy" was charted today by attending. HR in the 160s per RRRN, in Afib with RVR per 12 lead EKG. Ativan 2mg  given to pt by RRRN and NP to bedside.  S: Can not do ROS due to pt being unresponsive. Per RN-info above. Per RRRN, pt was unresponsive upon her arrival. Weak cough with LE edema noted. RRRN stopped IVF. Rest as above. O: Very poor appearing male in NAD. BP 95/60. HR 140s Afib. RR 23. SaO2 95-100% after NRB placed. Unresponsive. Does not open eyes nor withdraw to pain. PERRL. Pupils 1+ (2+ was charted earlier). Non verbal and does not follow commands. No spontaneous movement of extremities. Card: Distant heart sounds, irreg irreg. Weak radial pulses. Lungs: Grunting sounds only.  LEs: 3-4+ edema. Brown discoloration to tibial area bilaterally.  A/P: 1. Unresponsiveness-was occurring prior to the Ativan but slightly different that earlier in the day. The Ativan is contributing to mental status at present.  Neuro is on board and pt is on continuous EEG in room. Pt was placed on Keppra by neuro. Has had no po meds today which likely reflects on his noted mental status.  2. Recent CVA with sequelae. CTA head and neck from 11/17 with severe Lt MCA stenosis, not present on previous MRA brain. No new CVA, no bleed. 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 Neuro on board.  3. Afib with RVR-reviewed HR from today. HR in the 100s at times as high as 120s. On Metoprolol IV q6. Gave 2.5mg  IV now and HR back to  the 120s. BP down to 88/56-92/60, so we can not medicate him further at this point.  4. Chronic systolic/diastolic CHF-appears to be slightly overloaded on exam. NP is unsure as to his LE edema. Chart says 2+ and chronic. Now, LE edema noted to be 4+. Can not give Lasix now due to BP. IVF off.  5. Hypernatremia-was on D5. Last Na 146. Recheck in am. 6. Seizure activity-on continuous EEG. Ativan given. On Keppra.  7. UTI/PNA-on abx. Thought to be reason for his confusion on admission. Mental status has decreased since then.  NP called son. No answer. NP called daughter and updated her on tonight's event and status. NP informed daughter that pt was not doing well. Daughter stated pt was not responsive to her when she visited this afternoon. Daughter confirmed pt is a DNR.  CRITICAL CARE Performed by: Gardiner Barefoot Total critical care time: Start: 2045. End 2130. Total 45 minutes. Critical care time was exclusive of separately billable procedures and treating other patients. Critical care was necessary to treat or prevent imminent or life-threatening deterioration. Critical care was time spent personally by me on the following activities: development of treatment plan with patient and/or surrogate as well as nursing, discussions with consultants, evaluation of patient's response to treatment, examination of patient, obtaining history from patient or surrogate, ordering and performing treatments and interventions, ordering and review of laboratory studies, ordering and review of radiographic studies, pulse oximetry and re-evaluation of  patient's condition.

## 2019-06-09 NOTE — Progress Notes (Signed)
Pt Heart rate is in the 140 sustained . MD notified

## 2019-06-09 NOTE — Significant Event (Signed)
Rapid Response Event Note  Overview: Time Called: 2028 Arrival Time: 2031 Event Type: Respiratory  Called by bedside RN stating pt spO2 67% on 4L. Instructed RN to get NRB and full set of vitals.   Initial Focused Assessment: On arrival, pt laying in bed unresponsive with slight bilateral arm jerking and left hand posturing. Pupils pinpoint, HR 153, BP 145/92, RR 23, spO2 68% on 4L [place]. Continuous EEG connected to pt. Event button pushed to record activity. Lungs rhonchi/diminished with not much air flow noted. Pt with shallow respiratory pattern and very weak congested cough. Edema noted throughout all extremities with BLE +3-4.   Interventions: 2mg  Ativan EKG- afib RVR Fluids stopped 2.5mg  Metoprolol IV   At end of event, vitals: HR 122, BP 90/63, RR 26, spO2 100%.   Plan of Care (if not transferred): Continue to monitor pt. Provider will attempt to call family. Pt is currently DNR.  Charge RN made aware of fluids stopped (was receiving d5, 1/2NS) and to keep an eye on CBG. Will add pt to radar and check back on status and follow up with plan of care after family notified.   Event Summary: Name of Physician Notified: Baltazar Najjar [facility] at 2039    at    Outcome: Stayed in room and stabalized  Event End Time: 2110  Sherilyn Dacosta

## 2019-06-09 NOTE — Progress Notes (Addendum)
PROGRESS NOTE    Leonard Montoya  QVZ:[id number] DOB: 11/13/1940 DOA: 06/07/2019 PCP: Susy Frizzle, MD   Brief Narrative:  78 year old male with prior history of atrial fibrillation not on anticoagulation, history of CVA, chronic combined systolic and diastolic heart failure was brought in from home for increased confusion.  Patient was found to have Enterococcus urinary tract infection and community-acquired pneumonia.  Neurology consulted and MRI of the brain without contrast ordered.  Assessment & Plan:   Active Problems:   Essential hypertension, benign   Asthma, chronic   Atrial fibrillation with rapid ventricular response (HCC)   Community acquired pneumonia   Pressure ulcer   UTI (urinary tract infection)   CVA (cerebral vascular accident) ([place])   Acute encephalopathy   Chronic combined systolic and diastolic CHF (congestive heart failure) ([place])  Acute metabolic/toxic encephalopathy Probably secondary to urinary tract infection/pneumonia versus seizures. Neurology consulted and MRI of the brain without contrast ordered. Started the patient on Keppra 250 mg BID Lactic acid level within normal limits. CT angiogram of the head shows no acute intracranial hemorrhage or evidence of evolving recent infarction. No proximal intracranial vessel occlusion. Posterior circulation atherosclerosis. Severe left MCA stenosis identified on prior MRA is not seen. 1-2 mm inferiorly directed outpouching from the distal supraclinoid left ICA reflecting a small aneurysm or infundibulum.  Hypernatremia:  Possibly free water deficit from poor oral intake.  Gently hydrate with dextrose NS fluids.  Repeat sodium in am.   Urinary tract infection Urine cultures growing Citrobacter and Enterococcus. IV antibiotics changed to IV Levaquin to cover both urinary tract infection and pneumonia.   Multifocal pneumonia Patient was started on Rocephin and Zithromax later changed to IV Levaquin.   Patient is on 2 lit of [place] with good oxygen sats.   Persistent atrial fibrillation  IV metoprolol 5 mg every 6 hours ordered. Pt not on anticoagulation at home.    Chronic combined systolic and diastolic heart failure.  Pt appears compensated. Pt on lasix at home whiich is on hold for now.    H/o CVA:  CT angio of the HEAD ruled out large vessel CVA in view of his h/o of atrial fib and not on anticoagulation.      Sacral pressure ulcer.  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 (Active)  06/08/19 0245  Location: Sacrum  Location Orientation: Medial  Staging: Stage IV - Full thickness tissue loss with exposed bone, tendon or muscle.  Wound Description (Comments):   Present on Admission: Yes    Patient is followed by palliative care at home, in view of poor functional status, bed bound status, clinical deterioration, poor prognosis will request palliative care consult for goals of care discussion .      DVT prophylaxis: lovenox Code Status: DNR Family Communication: none at bedside. Called his daughter and son, couldn't reach them , left voicemail.  Disposition Plan:pending further evaluation, clinical improvement.    Consultants:   Neurology.    Procedures: EEG   Antimicrobials:IV levaquin.    Subjective: Pt sleepy , arousable.   Objective: Vitals:   06/09/19 0340 06/09/19 0500 06/09/19 0734 06/09/19 1110  BP: 91/63 94/72 119/86 101/79  Pulse: 98  (!) 139   Resp: 15 16 14 15   Temp: 98.3 F (36.8 C)  97.7 F (36.5 C) 97.9 F (36.6 C)  TempSrc: Oral  Axillary Axillary  SpO2: 100%  100% 99%  Weight: 133.9 kg  Intake/Output Summary (Last 24 hours) at 06/09/2019 1606 Last data filed at 06/09/2019 1500 Gross per 24 hour  Intake 1308.19 ml  Output 600 ml  Net 708.19 ml   Filed Weights   06/09/19 0340  Weight: 133.9 kg    Examination:  General exam: not in distress.    Respiratory system: scattered rhonchi, tachypnea Cardiovascular system: S1 & S2 heard, irregular, tachycardic.  Gastrointestinal system: Abdomen is nondistended, soft and nontender Central nervous system: arousable. Not following commands. Opens his eyes briefly.  Extremities: bilateral lower extremity 2+ edema, appears to be chronic.  Skin: No rashes, lesions or ulcers Psychiatry: SLEEPY , couldn't be assessed.     Data Reviewed: I have personally reviewed following labs and imaging studies  CBC: Recent Labs  Lab 06/07/19 1520 06/08/19 0913  WBC 10.5 11.3*  NEUTROABS 8.4* 8.7*  HGB 17.4* 15.9  HCT 55.8* 51.0  MCV 99.6 101.6*  PLT 192 185   Basic Metabolic Panel: Recent Labs  Lab 06/07/19 1819 06/08/19 0913  NA 145 147*  K 4.6 4.0  CL 102 106  CO2 31 30  GLUCOSE 112* 105*  BUN 24* 20  CREATININE 0.50* 0.67  CALCIUM 8.9 9.3   GFR: CrCl cannot be calculated (Unknown ideal weight.). Liver Function Tests: Recent Labs  Lab 06/07/19 1819  AST 29  ALT 17  ALKPHOS 78  BILITOT 1.1  PROT 5.7*  ALBUMIN 2.6*   No results for input(s): LIPASE, AMYLASE in the last 168 hours. No results for input(s): AMMONIA in the last 168 hours. Coagulation Profile: No results for input(s): INR, PROTIME in the last 168 hours. Cardiac Enzymes: No results for input(s): CKTOTAL, CKMB, CKMBINDEX, TROPONINI in the last 168 hours. BNP (last 3 results) No results for input(s): PROBNP in the last 8760 hours. HbA1C: No results for input(s): HGBA1C in the last 72 hours. CBG: Recent Labs  Lab 06/07/19 1505  GLUCAP 96   Lipid Profile: No results for input(s): CHOL, HDL, LDLCALC, TRIG, CHOLHDL, LDLDIRECT in the last 72 hours. Thyroid Function Tests: No results for input(s): TSH, T4TOTAL, FREET4, T3FREE, THYROIDAB in the last 72 hours. Anemia Panel: No results for input(s): VITAMINB12, FOLATE, FERRITIN, TIBC, IRON, RETICCTPCT in the last 72 hours. Sepsis Labs: Recent Labs  Lab  06/07/19 1521 06/07/19 1645 06/08/19 0218 06/08/19 0913 06/09/19 0904  PROCALCITON  --   --   --  <0.10 <0.10  LATICACIDVEN 2.3* 2.2* 1.7 3.9*  --     Recent Results (from the past 240 hour(s))  Blood culture (routine x 2)     Status: None (Preliminary result)   Collection Time: 06/07/19  3:26 PM   Specimen: BLOOD RIGHT ARM  Result Value Ref Range Status   Specimen Description BLOOD RIGHT ARM  Final   Special Requests   Final    BOTTLES DRAWN AEROBIC AND ANAEROBIC Blood Culture adequate volume   Culture   Final    NO GROWTH 2 DAYS Performed at [facility], [address] [place], [place] [address]    Report Status PENDING  Incomplete  Blood culture (routine x 2)     Status: None (Preliminary result)   Collection Time: 06/07/19  4:15 PM   Specimen: BLOOD RIGHT HAND  Result Value Ref Range Status   Specimen Description BLOOD RIGHT HAND  Final   Special Requests   Final    BOTTLES DRAWN AEROBIC ONLY Blood Culture results may not be optimal due to an inadequate volume of blood received in  culture bottles   Culture   Final    NO GROWTH 2 DAYS Performed at [facility], [address] [place], [place] [address]    Report Status PENDING  Incomplete  Urine culture     Status: Abnormal   Collection Time: 06/07/19  7:53 PM   Specimen: Urine, Catheterized  Result Value Ref Range Status   Specimen Description URINE, CATHETERIZED  Final   Special Requests   Final    NONE Performed at [facility], [address] [place], [place] [address]    Culture (A)  Final    >=100,000 COLONIES/mL CITROBACTER AMALONATICUS 60,000 COLONIES/mL ENTEROCOCCUS FAECALIS    Report Status 06/09/2019 FINAL  Final   Organism ID, Bacteria CITROBACTER AMALONATICUS (A)  Final   Organism ID, Bacteria ENTEROCOCCUS FAECALIS (A)  Final      Susceptibility   Citrobacter amalonaticus - MIC*    CEFAZOLIN >=64 RESISTANT Resistant     CEFTRIAXONE <=1 SENSITIVE Sensitive     CIPROFLOXACIN  <=0.25 SENSITIVE Sensitive     GENTAMICIN <=1 SENSITIVE Sensitive     IMIPENEM <=0.25 SENSITIVE Sensitive     NITROFURANTOIN 128 RESISTANT Resistant     TRIMETH/SULFA <=20 SENSITIVE Sensitive     PIP/TAZO <=4 SENSITIVE Sensitive     * >=100,000 COLONIES/mL CITROBACTER AMALONATICUS   Enterococcus faecalis - MIC*    AMPICILLIN <=2 SENSITIVE Sensitive     LEVOFLOXACIN 1 SENSITIVE Sensitive     NITROFURANTOIN <=16 SENSITIVE Sensitive     VANCOMYCIN 2 SENSITIVE Sensitive     * 60,000 COLONIES/mL ENTEROCOCCUS FAECALIS  SARS CORONAVIRUS 2 (TAT 6-24 HRS)     Status: None   Collection Time: 06/07/19  8:58 PM  Result Value Ref Range Status   SARS Coronavirus 2 NEGATIVE NEGATIVE Final    Comment: (NOTE) SARS-CoV-2 target nucleic acids are NOT DETECTED. The SARS-CoV-2 RNA is generally detectable in upper and lower respiratory specimens during the acute phase of infection. Negative results do not preclude SARS-CoV-2 infection, do not rule out co-infections with other pathogens, and should not be used as the sole basis for treatment or other patient management decisions. Negative results must be combined with clinical observations, patient history, and epidemiological information. The expected result is Negative. Fact Sheet for Patients: HairSlick.nohttps://www.fda.gov/media/138098/download Fact Sheet for Healthcare Providers: quierodirigir.comhttps://www.fda.gov/media/138095/download This test is not yet approved or cleared by the [place] FDA and  has been authorized for detection and/or diagnosis of SARS-CoV-2 by FDA under an Emergency Use Authorization (EUA). This EUA will remain  in effect (meaning this test can be used) for the duration of the COVID-19 declaration under Section 56 4(b)(1) of the Act, 21 U.S.C. section 360bbb-3(b)(1), unless the authorization is terminated or revoked sooner. Performed at [facility], [address] [place], [place] [address]          Radiology Studies: Ct  Angio Head W Or Wo Contrast  Result Date: 06/09/2019 CLINICAL DATA:  Altered mental status, history of stroke, atrial fibrillation EXAM: CT ANGIOGRAPHY HEAD TECHNIQUE: Multidetector CT imaging of the head was performed using the standard protocol during bolus administration of intravenous contrast. Multiplanar CT image reconstructions and MIPs were obtained to evaluate the vascular anatomy. CONTRAST:  50mL OMNIPAQUE IOHEXOL 350 MG/ML SOLN COMPARISON:  MRA 2018, CT head 06/07/2019 FINDINGS: CT HEAD Brain: There is no acute intracranial hemorrhage, mass effect, or edema. There is no new loss of gray-white differentiation. Chronic left PCA territory infarction again identified with associated ex  vacuo dilatation of the adjacent left lateral ventricle. Chronic small vessel infarcts of the left lentiform nucleus and right cerebellum are also again seen. Additional patchy hypoattenuation in the supratentorial white matter likely reflects stable chronic microvascular ischemic changes. Prominence of the ventricles and sulci reflects stable parenchymal volume loss. Vascular: There is mild intracranial atherosclerotic calcification at the skull base. Skull: Unremarkable. Sinuses: Aerated. Orbits: Unremarkable. CTA HEAD Anterior circulation: Intracranial internal carotid arteries are patent with mild calcified plaque. There is a 1-2 mm inferiorly directed outpouching from the distal supraclinoid left ICA. Anterior and middle cerebral arteries are patent. The short segment of left MCA severe stenosis seen on prior MRA is not present on this study. Posterior circulation: Intracranial vertebral arteries, basilar artery, and posterior cerebral arteries are patent. There is atherosclerotic irregularity of the posterior cerebral arteries and new focal moderate stenosis of the right P2 PCA. Venous sinuses: As permitted by contrast timing, patent. IMPRESSION: No acute intracranial hemorrhage or evidence of evolving recent  infarction. Stable chronic findings detailed above. No proximal intracranial vessel occlusion. Posterior circulation atherosclerosis. Severe left MCA stenosis identified on prior MRA is not seen. 1-2 mm inferiorly directed outpouching from the distal supraclinoid left ICA reflecting a small aneurysm or infundibulum. Electronically Signed   By: Guadlupe Spanish M.D.   On: 06/09/2019 10:46   Dg Chest Port 1 View  Result Date: 06/07/2019 CLINICAL DATA:  Altered, confusion and slurred speech for 3 weeks, history of recent UTI EXAM: PORTABLE CHEST 1 VIEW COMPARISON:  Radiograph Nov 23, 2017 FINDINGS: There is bilateral pleural thickening. Portion of the right costophrenic sulcus is collimated from view. No consolidative airspace opacity is seen. Cardiomegaly and prominence of the right heart border is similar to prior likely reflecting the abundant pericardial fat seen on prior CT. Some patchy opacity is noted in the right lower lung and left mid lung. Tracheal tortuosity, similar to prior. Degenerative changes are present in the imaged spine and shoulders. IMPRESSION: 1. Bilateral pleural thickening, could reflect chronic effusions seen on comparison studies. 2. Patchy opacity in the right lower lung and left mid lung, suspicious for pneumonia. 3. Unchanged cardiomegaly. Electronically Signed   By: Kreg Shropshire M.D.   On: 06/07/2019 16:41        Scheduled Meds:  enoxaparin (LOVENOX) injection  40 mg Subcutaneous Q24H   hydrocerin   Topical BID   metoprolol tartrate  5 mg Intravenous Q6H   sodium chloride flush  3 mL Intravenous Q12H   sodium chloride flush  3 mL Intravenous Q12H   Continuous Infusions:  sodium chloride     azithromycin 500 mg (06/08/19 2139)   cefTRIAXone (ROCEPHIN)  IV 2 g (06/08/19 2106)   levETIRAcetam 250 mg (06/09/19 1604)     LOS: 2 days      Kathlen Mody, MD [facility] 06/09/2019, 4:06 PM

## 2019-06-09 NOTE — Progress Notes (Signed)
Reason for consult: Seizure  Subjective: No clinical seizure-like episodes overnight.  However patient continues to be drowsy, only opens eyes to noxious stimuli and does not follow any commands.   ROS: Unable to obtain due to poor mental status  Examination  Vital signs in last 24 hours: Temp:  [97.6 F (36.4 C)-98.6 F (37 C)] 97.7 F (36.5 C) (11/18 0734) Pulse Rate:  [97-139] 139 (11/18 0734) Resp:  [12-23] 14 (11/18 0734) BP: (91-147)/(61-133) 119/86 (11/18 0734) SpO2:  [96 %-100 %] 100 % (11/18 0734) Weight:  [133.9 kg] 133.9 kg (11/18 0340)  General: lying in bed, NAD CVS: Tachycardic, irregular rate and rhythm RS: breathing comfortably Extremities: Bilateral lower extremity skin discoloration and edema  Neuro: MS: Opens eyes to repeated tactile stimuli, does not follow commands, does not track  Cranial Nerves: PERRLA, oculocephalic reflex intact, corneal reflex intact, does not blink to threat, no apparent facial asymmetry, rest of the cranial nerves unable to assess secondary to altered mental status. Motor: Withdraws to noxious stimuli in left upper and lower extremity, does not withdraw to noxious stimuli in right lower extremity, withdraws to noxious stimuli in right upper extremity with increased tone in right upper extremity Deep Tendon Reflexes:1+ and symmetric in the biceps and patellae.  Cerebellar: Unable to assess secondary to altered mental status.  Basic Metabolic Panel: Recent Labs  Lab 06/07/19 1819 06/08/19 0913  NA 145 147*  K 4.6 4.0  CL 102 106  CO2 31 30  GLUCOSE 112* 105*  BUN 24* 20  CREATININE 0.50* 0.67  CALCIUM 8.9 9.3    CBC: Recent Labs  Lab 06/07/19 1520 06/08/19 0913  WBC 10.5 11.3*  NEUTROABS 8.4* 8.7*  HGB 17.4* 15.9  HCT 55.8* 51.0  MCV 99.6 101.6*  PLT 192 185    Coagulation Studies: No results for input(s): LABPROT, INR in the last 72 hours.  Imaging CT head 11/60/2020: Region of gliosis in the right cerebellar  hemisphere is new since 2018 but has a more chronic appearance. No other new or acute intracranial abnormality is seen.  Stable gliotic changes related to prior left PCA territory infarction and chronic lacunar type infarcts in the left basal ganglia.  Moderate parenchymal volume loss and chronic microvascular angiopathy changes are similar to prior.  Assessment and plan: 78 year old male with atrial fibrillation not on anticoagulation who presented with worsening mental status.    Epilepsy Chronic left PCA stroke Encephalopathy Right lower extremity paresis -Routine EEG showed frequent left parieto-occipital spikes. -Patient has had prior left parieto-occipital stroke.  In the setting of UTI patient likely had lowered seizure threshold -Encephalopathy is likely secondary to hypernatremia, UTI and possible seizure -Per review of notes, on day of admission on 06/07/2019 patient was oriented to person and place.  On my evaluation yesterday morning he was drowsy, not following commands which I attributed to overall encephalopathy and possible seizure.  Discussing with nurse taking care of patient yesterday, patient had verbalized a few things but had slurred speech yesterday evening.  However, this morning he continues to be nonverbal and also has right lower extremity weakness as well as stiffness in right upper extremity.  Given his history of atrial fibrillation and not on anticoagulation there is concern for stroke.  His last known normal is unknown.    Recommendations -Ordered CT head and CTA head stat for stroke evaluation.  Personally reviewed CT head and CTA head.  No large vessel occlusion was noted, will review final radiology read when available. -  Patient will not be a candidate for TPA due to unknown last known normal. -Will order overnight EEG to assess for for subclinical seizures. -Started Keppra 250 mg twice daily yesterday.  If needed, this dose can be further increased  to 500 mg twice daily. -Continue management of UTI and hypernatremia per primary team -Low suspicion for meningitis contributing to encephalopathy at this point due to absence of nuchal rigidity and gradual decline in mental status.  Therefore will hold off on lumbar puncture. -Seizure precautions -As needed IV Ativan 2 mg for generalized tonic-clonic seizure lasting more than 2 minutes or focal seizure lasting more than 5 minutes  Thank you for allowing [place] to participate in the care of this patient.  Please page the neuro hospitalist after 5 PM.   I have spent a total of   35 minutes with the patient reviewing hospital notes,  test results, labs and examining the patient as well as establishing an assessment and plan that was discussed personally with the patient.  > 50% of time was spent in direct patient care.    Mellissa Conley [name]

## 2019-06-09 NOTE — Progress Notes (Signed)
LTM EEG hooked up and running - no initial skin breakdown - push button tested - neuro notified.  

## 2019-06-09 NOTE — Progress Notes (Signed)
Patient satting 100% on NRB.  RT placed patient on 6L [place].  Patient stable still satting 100%.  RT will continue to monitor.

## 2019-06-10 ENCOUNTER — Inpatient Hospital Stay (HOSPITAL_COMMUNITY): Payer: Medicare HMO

## 2019-06-10 DIAGNOSIS — R569 Unspecified convulsions: Secondary | ICD-10-CM

## 2019-06-10 DIAGNOSIS — Z515 Encounter for palliative care: Secondary | ICD-10-CM

## 2019-06-10 DIAGNOSIS — Z7189 Other specified counseling: Secondary | ICD-10-CM

## 2019-06-10 LAB — CBC
HCT: 50.8 % (ref 39.0–52.0)
Hemoglobin: 15.2 g/dL (ref 13.0–17.0)
MCH: 31.3 pg (ref 26.0–34.0)
MCHC: 29.9 g/dL — ABNORMAL LOW (ref 30.0–36.0)
MCV: 104.7 fL — ABNORMAL HIGH (ref 80.0–100.0)
Platelets: 122 10*3/uL — ABNORMAL LOW (ref 150–400)
RBC: 4.85 MIL/uL (ref 4.22–5.81)
RDW: 14.7 % (ref 11.5–15.5)
WBC: 13.8 10*3/uL — ABNORMAL HIGH (ref 4.0–10.5)
nRBC: 0 % (ref 0.0–0.2)

## 2019-06-10 LAB — BASIC METABOLIC PANEL
Anion gap: 11 (ref 5–15)
BUN: 27 mg/dL — ABNORMAL HIGH (ref 8–23)
CO2: 28 mmol/L (ref 22–32)
Calcium: 9.1 mg/dL (ref 8.9–10.3)
Chloride: 108 mmol/L (ref 98–111)
Creatinine, Ser: 0.76 mg/dL (ref 0.61–1.24)
GFR calc Af Amer: >60 mL/min (ref >60)
GFR calc non Af Amer: >60 mL/min (ref >60)
Glucose, Bld: 98 mg/dL (ref 70–99)
Potassium: 4.3 mmol/L (ref 3.5–5.1)
Sodium: 147 mmol/L — ABNORMAL HIGH (ref 135–145)

## 2019-06-10 MED ORDER — ACETAMINOPHEN 650 MG RE SUPP: 650.0000 mg | Freq: Four times a day (QID) | RECTAL | Status: DC | PRN

## 2019-06-10 MED ORDER — QUETIAPINE FUMARATE 50 MG PO TABS: 100.0000 mg | ORAL_TABLET | Freq: Every day | ORAL | Status: DC

## 2019-06-10 MED ORDER — GLYCOPYRROLATE 0.2 MG/ML IJ SOLN
0.3000 mg | INTRAMUSCULAR | Status: DC | PRN
  Administered 2019-06-10 (×2): 0.3 mg via INTRAVENOUS
  Filled 2019-06-10 (×2): qty 2

## 2019-06-10 MED ORDER — MORPHINE SULFATE (PF) 2 MG/ML IV SOLN: 1.0000 mg | INTRAVENOUS | Status: DC | PRN

## 2019-06-10 MED ORDER — DILTIAZEM HCL-DEXTROSE 125-5 MG/125ML-% IV SOLN (PREMIX)
5.0000 mg/h | INTRAVENOUS | Status: DC
  Filled 2019-06-10: qty 125

## 2019-06-10 MED ORDER — POLYVINYL ALCOHOL 1.4 % OP SOLN
1.0000 [drp] | Freq: Four times a day (QID) | OPHTHALMIC | Status: DC | PRN
  Filled 2019-06-10: qty 15

## 2019-06-10 MED ORDER — SODIUM CHLORIDE 0.9 % IV SOLN: 2.0000 g | Freq: Three times a day (TID) | INTRAVENOUS | Status: DC

## 2019-06-10 MED ORDER — GABAPENTIN 300 MG PO CAPS: 600.0000 mg | ORAL_CAPSULE | Freq: Every day | ORAL | Status: DC

## 2019-06-10 MED ORDER — MORPHINE SULFATE (PF) 2 MG/ML IV SOLN
1.0000 mg | INTRAVENOUS | Status: DC | PRN
  Administered 2019-06-10: 2 mg via INTRAVENOUS
  Filled 2019-06-10 (×2): qty 1

## 2019-06-10 MED ORDER — HYDROCERIN EX CREA
TOPICAL_CREAM | Freq: Two times a day (BID) | CUTANEOUS | Status: DC | PRN
  Filled 2019-06-10: qty 113

## 2019-06-10 MED ORDER — SODIUM CHLORIDE 0.45 % IV BOLUS: 1000.0000 mL | Freq: Once | INTRAVENOUS | Status: DC

## 2019-06-10 MED ORDER — VANCOMYCIN HCL 10 G IV SOLR
1250.0000 mg | Freq: Two times a day (BID) | INTRAVENOUS | Status: DC
  Filled 2019-06-10 (×2): qty 1250

## 2019-06-10 MED ORDER — LORAZEPAM 2 MG/ML IJ SOLN: 1.0000 mg | INTRAMUSCULAR | Status: DC | PRN

## 2019-06-10 MED ORDER — BIOTENE DRY MOUTH MT LIQD: 15.0000 mL | OROMUCOSAL | Status: DC | PRN

## 2019-06-10 NOTE — Progress Notes (Signed)
Manufacturing engineer [facility])  Referral received for residential hospice at [facility], from [facility].  Left message for dtr Lovey Newcomer, requested return call.   [facility] does not have a bed to offer today.   Will updated TOC mgr and family should that change.  Thank you, Venia Carbon RN, BSN, [place] [facility] Liaison [phone]

## 2019-06-10 NOTE — Progress Notes (Signed)
Reason for consult: Seizure  Subjective: Patient had an episode this morning where his left arm went up and had tremor like movements with stopped after IV Ativan.  However patient continues to be obtunded, does not open eyes, does not follow commands   ROS: Unable to obtain due to poor mental status  Examination  Vital signs in last 24 hours: Temp:  [97.5 F (36.4 C)-98.6 F (37 C)] 98.3 F (36.8 C) (11/19 0932) Pulse Rate:  [29-131] 86 (11/19 0932) Resp:  [8-36] 27 (11/19 0932) BP: (90-145)/(56-92) 108/69 (11/19 0932) SpO2:  [68 %-100 %] 95 % (11/19 0932) Weight:  [131.2 kg] 131.2 kg (11/19 0341)  General: lying in bed, NAD CVS: Tachycardic, irregular rate and rhythm RS: breathing comfortably Extremities: Bilateral lower extremity skin discoloration and edema  Neuro: MS:  Grimaces to noxious stimuli, does not follow commands, does not track Cranial Nerves:PERRLA, oculocephalic reflex intact, corneal reflex intact, no apparent facial asymmetry, rest of the cranial nerves unable to assess secondary to altered mental status. Motor:Withdraws to noxious stimuli in all 4 extremities Deep Tendon Reflexes:1+ and symmetric in the biceps and patellae.  Cerebellar:Unable to assess secondary to altered mental status. Basic Metabolic Panel: Recent Labs  Lab 06/07/19 1819 06/08/19 0913 06/09/19 1632 06/10/19 0737  NA 145 147* 146* 147*  K 4.6 4.0 4.3 4.3  CL 102 106 108 108  CO2 31 30 26 28   GLUCOSE 112* 105* 121* 98  BUN 24* 20 25* 27*  CREATININE 0.50* 0.67 0.56* 0.76  CALCIUM 8.9 9.3 9.0 9.1    CBC: Recent Labs  Lab 06/07/19 1520 06/08/19 0913 06/10/19 0737  WBC 10.5 11.3* 13.8*  NEUTROABS 8.4* 8.7*  --   HGB 17.4* 15.9 15.2  HCT 55.8* 51.0 50.8  MCV 99.6 101.6* 104.7*  PLT 192 185 122*     Coagulation Studies: No results for input(s): LABPROT, INR in the last 72 hours.  Imaging CT head 06/07/2019: Region of gliosis in the right cerebellar hemisphere is  new since 2018 but has a more chronic appearance. No other new or acute intracranial abnormality is seen.  Stable gliotic changes related to prior left PCA territory infarction and chronic lacunar type infarcts in the left basal ganglia.  Moderate parenchymal volume loss and chronic microvascular angiopathy changes are similar to prior.  CTA head and neck 06/09/2019: No large vessel occlusion.  Assessment and plan:78 year old male with atrial fibrillation not on anticoagulation who presented with worsening mental status.   Epilepsy Chronic left PCA stroke Encephalopathy -LTM EEG did not show any subclinical seizures however the episode of left upper extremity elevation with tremor-like movements could have been a focal motor seizure. -Encephalopathy is likely secondary to hypernatremia, UTI and possible seizure -Per review of notes, on day of admission on 06/07/2019 patient was oriented to person and place.   -Dr. 06/09/2019 updated me that patient's family is transitioning him to comfort care.  Recommendations -We will discontinue LTM as patient did not have any subclinical seizures and patient's family wishes to transition to comfort care -Continue Keppra 250 mg twice daily -Rest of the management per primary team -Seizure precautions -As needed IV Ativan 2 mg for generalized tonic-clonic seizure lasting more than41minutes or focal seizure lasting more than 5 minutes  Thank you for allowing [employer] to participate in the care of this patient. Neurology will sign off.  Please page the neuro hospitalist after 5 PM.   I have spent a total of  25 minuteswith the patient reviewing  hospitalnotes,  test results, labs and examining the patient as well as establishing an assessment and plan that was discussed personally with the patient's team.>50% of time was spent in direct patient care.

## 2019-06-10 NOTE — Progress Notes (Signed)
LTM EEG discontinued - no skin breakdown at unhook.   

## 2019-06-10 NOTE — Consult Note (Signed)
WOC consult requested for chronic stage 4 sacrum pressure injury.  This was already performed on 11/17; refer to previous progress notes for assessment and measurements, and topical treatment orders have been provided for bedside nurses to perform.  Please re-consult if further assistance is needed.  Thank-you,  Julien Girt MSN, [place], [place], [employer], [place]

## 2019-06-10 NOTE — Progress Notes (Signed)
As I walked into the room at about 0615, Pt's lt hand was slight raised and shaking at the same time Pt was grunting as if in pain. 2mg  os Ativan was administered at about [phone]

## 2019-06-10 NOTE — TOC Initial Note (Signed)
Transition of Care [facility]) - Initial/Assessment Note    Patient Details  Name: Leonard Montoya MRN: [id number] Date of Birth: 1941/01/11  Transition of Care [facility]) CM/SW Contact:    Baldemar Lenis, LCSW Phone Number: 06/10/2019, 2:04 PM  Clinical Narrative:    CSW spoke with patient's daughter, Andrey Campanile, to confirm interest in [facility], per consult. Andrey Campanile confirmed that they would like to get the patient to [facility] for hospice care. CSW explained expectations for transition, and Surveyor, quantity of information. CSW then contacted Victorino Dike at [facility] to place referral, and Victorino Dike indicated that there are no beds available but she will reach out to daughter and keep CSW updated. CSW to follow.               Expected Discharge Plan: Hospice Medical Facility Barriers to Discharge: Hospice Bed not available   Patient Goals and CMS Choice Patient states their goals for this hospitalization and ongoing recovery are:: patient unable to state goals due to medical condition, family wants him to be comfortable CMS Medicare.gov Compare Post Acute Care list provided to:: Patient Represenative (must comment) Choice offered to / list presented to : Adult Children  Expected Discharge Plan and Services Expected Discharge Plan: Hospice Medical Facility     Post Acute Care Choice: Hospice Living arrangements for the past 2 months: Single Family Home                                      Prior Living Arrangements/Services Living arrangements for the past 2 months: Single Family Home   Patient language and need for interpreter reviewed:: No Do you feel safe going back to the place where you live?: Yes      Need for Family Participation in Patient Care: Yes (Comment) Care giver support system in place?: No (comment)   Criminal Activity/Legal Involvement Pertinent to Current Situation/Hospitalization: No - Comment as needed  Activities of Daily Living      Permission  Sought/Granted Permission sought to share information with : Facility Medical sales representative, Family Supports Permission granted to share information with : Yes, Verbal Permission Granted  Share Information with NAME: Dois Davenport  Permission granted to share info w AGENCY: Training and development officer granted to share info w Relationship: Daughter     Emotional Assessment Appearance:: Appears stated age Attitude/Demeanor/Rapport: Unable to Assess, Aggressive (Verbally and/or physically) Affect (typically observed): Unable to Assess Orientation: : (unable to assess) Alcohol / Substance Use: Not Applicable Psych Involvement: No (comment)  Admission diagnosis:  Altered mental status, unspecified altered mental status type [R41.82] Urinary tract infection associated with catheterization of urinary tract, unspecified indwelling urinary catheter type, initial encounter (HCC) [J69.678L, N39.0] Patient Active Problem List   Diagnosis Date Noted  . Altered mental status   . Acute encephalopathy 06/07/2019  . Chronic combined systolic and diastolic CHF (congestive heart failure) (HCC) 06/07/2019  . Positive D dimer   . Hypoxia   . Chest pain   . Hypoxemia 11/21/2017  . Aggressive behavior of adult 11/21/2017  . UTI (urinary tract infection) 01/31/2017  . CVA (cerebral vascular accident) (HCC) 01/31/2017  . Right sided weakness 01/30/2017  . Shortness of breath   . Encounter for palliative care   . Goals of care, counseling/discussion   . Pressure ulcer 10/23/2015  . Acute congestive heart failure (HCC)   . Atrial fibrillation with rapid ventricular response (HCC) 10/21/2015  .  Sepsis ([place]) 10/21/2015  . AKI (acute kidney injury) ([place]) 10/21/2015  . Acute CHF (congestive heart failure) ([place]) 10/21/2015  . Thrombocytopenia ([place]) 10/21/2015  . Community acquired pneumonia 10/21/2015  . Gait instability 10/18/2013  . At high risk for falls 09/28/2013  . Chronic insomnia 09/28/2013  . Essential  hypertension, benign 09/27/2013  . Atrial fibrillation ([place]) 09/27/2013  . Morbid obesity ([place]) 09/27/2013  . Omphalitis in adult 09/27/2013  . Asthma, chronic 09/27/2013  . Venous stasis dermatitis 09/27/2013  . Other and unspecified hyperlipidemia 09/27/2013   PCP:  Susy Frizzle, MD Pharmacy:   [id number] - [place], [place] [address] [place] [address] Phone: [phone] Fax: [phone]     Social Determinants of Health (SDOH) Interventions    Readmission Risk Interventions No flowsheet data found.

## 2019-06-10 NOTE — Care Management Important Message (Signed)
Important Message  Patient Details  Name: Leonard Montoya MRN: [id number] Date of Birth: [birth year]   Medicare Important Message Given:  Yes     Cleola Perryman Montine Circle 06/10/2019, 12:42 PM

## 2019-06-10 NOTE — Plan of Care (Signed)
  Problem: Nutrition: Goal: Adequate nutrition will be maintained Outcome: Not Progressing   

## 2019-06-10 NOTE — Progress Notes (Deleted)
Pharmacy Antibiotic Note  Leonard Montoya is a 78 y.o. male admitted on 06/07/2019 with pneumonia.  Pharmacy has been consulted for Vanc and Cefepime dosing.  CrCL > 100 ml/min  Vancomycin 1250 mg IV Q 12 hrs. Goal AUC 400-550. Expected AUC: 494 SCr used: 0.76  Plan: Start Cefepime 2 gram IV q8hr Start Vancomycin 1250 mg IV q12hr Monitor renal function, clinical status, C&S and vanc levels as needed  Height: 6\' 1"  (185.4 cm) Weight: 289 lb 3.9 oz (131.2 kg) IBW/kg (Calculated) : 79.9  Temp (24hrs), Avg:98.1 F (36.7 C), Min:97.5 F (36.4 C), Max:98.6 F (37 C)  Recent Labs  Lab 06/07/19 1520 06/07/19 1521 06/07/19 1645 06/07/19 1819 06/08/19 0218 06/08/19 0913 06/09/19 1632 06/10/19 0737  WBC 10.5  --   --   --   --  11.3*  --  13.8*  CREATININE  --   --   --  0.50*  --  0.67 0.56* 0.76  LATICACIDVEN  --  2.3* 2.2*  --  1.7 3.9* 1.2  --     Estimated Creatinine Clearance: 104.6 mL/min (by C-G formula based on SCr of 0.76 mg/dL).    Allergies  Allergen Reactions  . Sudafed [Pseudoephedrine Hcl] Other (See Comments)    Weird Dreams    Antimicrobials this admission: Vanc 11/19 >>  Cefepime 11/19 >>   Thank you for allowing pharmacy to be a part of this patient's care.  Alanda Slim, PharmD, [facility] Clinical Pharmacist Please see AMION for all Pharmacists' Contact Phone Numbers 06/10/2019, 9:21 AM

## 2019-06-10 NOTE — Progress Notes (Signed)
SLP Cancellation Note  Patient Details Name: Leonard Montoya MRN: [id number] DOB: 01/26/1941   Cancelled treatment:       Reason Eval/Treat Not Completed: Per RN, pt is now comfort care. Orders discontinued. ST signing off.  Carmela Rima, [place] Speech Language Pathologist Office: [phone] Pager: [phone]  Shonna Chock 06/10/2019, 10:39 AM

## 2019-06-10 NOTE — Consult Note (Signed)
Consultation Note Date: 06/10/2019   Patient Name: Leonard Montoya  DOB: [birth year]  MRN: [id number]  Age / Sex: 78 y.o., male   PCP: Donita Brooks, MD Referring Physician: Kathlen Mody, MD   REASON FOR CONSULTATION:Establishing goals of care, Non pain symptom management, Pain control, Psychosocial/spiritual support and Terminal Care  Palliative Care consult requested for this 78 y.o. male with multiple medical problems including CVA, chronic combined systolic and diastolic CHF, arthritis, asthma, atrial fibrillation (no anticoagulant prior to admission), BPH, hyperlipidemia, hypertension, and sacral pressure ulcer. [name] presented to ED from home with complaints from family of worsening confusion and slurred speech for approximately 3 weeks. Patient was seen by his home health nurse and was noted to have significant sedimentation and turbid urine in condom cath. During his work-up he was afebrile, vital signs stable. EKG showed atrial fibrillation. His head CT showed chronic gliosis in the right cerebellar hemisphere. Chest x-ray concerning for pneumonia. Lactic acid 2.3, BUN 24, Cr 0.50, Albumin 5.6. Patient being followed by Neurology and receiving ativan for seizure like activity. Palliative Medicine consulted for goals of care.   Clinical Assessment and Goals of Care: I have reviewed medical records including lab results, imaging, Epic notes, and MAR, received report from the bedside RN, and assessed the patient. I spoke with patient's daughter/POA Leonard Montoya via phone to discuss diagnosis prognosis, GOC, EOL wishes, disposition and options. Patient is obtunded and unable to engage in discussions.   I introduced Palliative Medicine as specialized medical care for people living with serious illness. It focuses on providing relief from the symptoms and stress of a serious illness. The goal is to improve quality of life for both the patient and the family.  We discussed a brief  life review of the patient, along with his functional and nutritional status. 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 He loves watching tv and food is a pleasure for him. He has 3 children.   Prior to admission patient lived in the home with his son, Kevin Space. Dois Davenport reports also providing care for her father in the home. She reports he has been bedbound for almost 3.5 years. She reports he has suffered from obesity for many years and reached a maximum weight of almost 400lbs. She states he was using an electric wheelchair due to difficulty with walking and eventually became more confined to the bed and stopped using his wheelchair. She reports his appetite has always been great, however over the past month he has little to no appetite requiring family to opt to push supplements such as Boost or Ensure. He required total care assistance.   We discussed His current illness and what it means in the larger context of His on-going co-morbidities. With specific discussions regarding his seizure activity, UTI, sacral ulcer, pneumonia, and his overall functional/nutritional decline. Natural disease trajectory and expectations at EOL were discussed.   Dois Davenport verbalized her understanding of patient's condition. She is tearful in expressing "my dad is tired of fighting. He is ready to go to heaven. His quality of life just not isn't what he would want and he has told Leonard Montoya this!" Support provided. Dois Davenport reports she and her brothers know that their father is nearing his end of life and they do not want him to suffer throughout.   I attempted to elicit values and goals of care important to the patient.    The difference between aggressive medical intervention and comfort care  was considered in light of the patient's goals of care. I educated family on what comfort care measures would look like. Daughter verbalized understanding and expresses family's wishes to transition patient to full comfort for  EOL care. Chaplain support was explained and offered however family declined.   Dois Davenport confirms DNR/DNI.   Hospice outpatient were explained and offered.Family verbalized their understanding and awareness of hospice's goals and philosophy of care. Daughter reports their previous interactions with hospice in the home and also at [facility]. Family is requesting [facility] consideration. I educated family on the referral, approval, and transferring process to [place]. Dois Davenport verbalized understanding and appreciation.   I educated family on visitation policy in the setting of patient transitioning to full comfort/EOL care. Family verbalized understanding and is aware that 4 family members (given names) are allowed to visit and be with Leonard Montoya during his last moments. Daughter appreciative.   Questions and concerns were addressed. The family was encouraged to call with questions or concerns.  PMT will continue to support holistically.   SOCIAL HISTORY:     reports that he quit smoking about 27 years ago. He has never used smokeless tobacco. He reports that he does not drink alcohol or use drugs.  CODE STATUS: DNR  ADVANCE DIRECTIVES: Leonard Montoya (daughter/POA)   SYMPTOM MANAGEMENT: see below   Palliative Prophylaxis:   Aspiration, Eye Care, Frequent Pain Assessment, Oral Care, Palliative Wound Care and Turn Reposition  PSYCHO-SOCIAL/SPIRITUAL:  Support System: Family   Desire for further Chaplaincy support: NO   Additional Recommendations (Limitations, Scope, Preferences):  Full Comfort Care   PAST MEDICAL HISTORY: Past Medical History:  Diagnosis Date  . Allergy 1978   rag weed, dust, animal dander, pollen  . Arthritis   . Asthma   . Atrial fibrillation (HCC)   . BPH (benign prostatic hypertrophy)   . Chronic insomnia   . Gout   . Hyperlipidemia   . Hypertension   . Obesity 1974    PAST SURGICAL HISTORY: History reviewed. No pertinent surgical history.   ALLERGIES:  is allergic to sudafed [pseudoephedrine hcl].   MEDICATIONS:  Current Facility-Administered Medications  Medication Dose Route Frequency Provider Last Rate Last Dose  . 0.9 %  sodium chloride infusion  250 mL Intravenous PRN Opyd, Lavone Neri, MD 10 mL/hr at 06/10/19 0500    . acetaminophen (TYLENOL) tablet 650 mg  650 mg Oral Q6H PRN Opyd, Lavone Neri, MD       Or  . acetaminophen (TYLENOL) suppository 650 mg  650 mg Rectal Q6H PRN Opyd, Lavone Neri, MD      . diltiazem (CARDIZEM) 125 mg in dextrose 5% 125 mL (1 mg/mL) infusion  5-15 mg/hr Intravenous Titrated Kathlen Mody, MD      . gabapentin (NEURONTIN) capsule 600 mg  600 mg Oral QHS Kathlen Mody, MD      . hydrocerin (EUCERIN) cream   Topical BID Narda Bonds, MD      . HYDROcodone-acetaminophen (NORCO/VICODIN) 5-325 MG per tablet 1 tablet  1 tablet Oral Q6H PRN Opyd, Lavone Neri, MD      . levETIRAcetam (KEPPRA) 250 mg in sodium chloride 0.9 % 100 mL IVPB  250 mg Intravenous Q12H Charlsie Quest, MD   Stopped at 06/10/19 0243  . LORazepam (ATIVAN) injection 2 mg  2 mg Intravenous PRN Charlsie Quest, MD   2 mg at 06/10/19 1610  . metoprolol tartrate (LOPRESSOR) injection 5 mg  5 mg Intravenous Q6H Kathlen Mody,  MD   5 mg at 06/10/19 0624  . morphine 2 MG/ML injection 1-2 mg  1-2 mg Intravenous Q4H PRN Hosie Poisson, MD      . ondansetron (ZOFRAN) tablet 4 mg  4 mg Oral Q6H PRN Opyd, Ilene Qua, MD       Or  . ondansetron (ZOFRAN) injection 4 mg  4 mg Intravenous Q6H PRN Opyd, Ilene Qua, MD      . polyethylene glycol (MIRALAX / GLYCOLAX) packet 17 g  17 g Oral Daily PRN Opyd, Ilene Qua, MD      . QUEtiapine (SEROQUEL) tablet 100 mg  100 mg Oral QHS Hosie Poisson, MD      . sodium chloride 0.45 % bolus 1,000 mL  1,000 mL Intravenous Once Hosie Poisson, MD      . sodium chloride flush (NS) 0.9 % injection 3 mL  3 mL Intravenous Q12H Opyd, Ilene Qua, MD   3 mL at 06/09/19 2301  . sodium chloride flush (NS) 0.9 % injection 3  mL  3 mL Intravenous Q12H Opyd, Ilene Qua, MD   3 mL at 06/09/19 2300  . sodium chloride flush (NS) 0.9 % injection 3 mL  3 mL Intravenous PRN Opyd, Ilene Qua, MD        VITAL SIGNS: BP 108/69 (BP Location: Right Arm)   Pulse 86   Temp 98.3 F (36.8 C) (Axillary)   Resp (!) 27   Ht 6\' 1"  (1.854 m)   Wt 131.2 kg   SpO2 95%   BMI 38.16 kg/m  Filed Weights   06/09/19 0340 06/10/19 0341  Weight: 133.9 kg 131.2 kg    Estimated body mass index is 38.16 kg/m as calculated from the following:   Height as of this encounter: 6\' 1"  (1.854 m).   Weight as of this encounter: 131.2 kg.  LABS: CBC:    Component Value Date/Time   WBC 13.8 (H) 06/10/2019 0737   HGB 15.2 06/10/2019 0737   HCT 50.8 06/10/2019 0737   PLT 122 (L) 06/10/2019 0737   Comprehensive Metabolic Panel:    Component Value Date/Time   NA 147 (H) 06/10/2019 0737   K 4.3 06/10/2019 0737   CO2 28 06/10/2019 0737   BUN 27 (H) 06/10/2019 0737   CREATININE 0.76 06/10/2019 0737   CREATININE 0.93 07/04/2016 1149   ALBUMIN 2.6 (L) 06/07/2019 1819     Review of Systems  Unable to perform ROS: Patient unresponsive    Physical Exam General:Obtunded, frail appearing, obese Cardiovascular: tacypneic, scattered rhonchi Pulmonary: diminished bases Abdomen: soft, nontender, + bowel sounds Extremities: bilateral lower extremity edema Skin: lower extremity discoloration, noted stage 4 sacral ulcer (not observed) Neurological: obtunded, does not follow commands   Prognosis: < 2 weeks in the setting of toxic encephalopathy, recurrent UTI, pneumonia, seizures, hypernatremia, respiratory failure with hypoxia, a-fib with RVR, combined CHF, obese, bedbound, stage 4 sacral ulcer, obtunded, CVA,   Discharge Planning:  Hospice facility [facility] at family's request)   Recommendations:  DNR/DNI-as confirmed by family  Family requesting to transition to full comfort/EOL care. Requesting [facility] consideration  (referral placed)  Will d/c orders not comfort focused.   Unrestricted family visitations in the setting of EOL care.   Continue Keppra/Ativan for seizure management. Discussed with Neuro  Robinul PRN for secretions  Morphine PRN for pain/air hunger  Zofran PRN for nausea  Liquifilm tears PRN for dry eyes  PMT will continue to support and follow    Palliative Performance Scale:  PPS 10%                Dois DavenportSandra (daughter) expressed understanding and was in agreement with this plan.   Thank you for allowing the Palliative Medicine Team to assist in the care of this patient.  Time In: 1000 Time Out: 1105 Time Total: 65 min.   Visit consisted of counseling and education dealing with the complex and emotionally intense issues of symptom management and palliative care in the setting of serious and potentially life-threatening illness.Greater than 50%  of this time was spent counseling and coordinating care related to the above assessment and plan.  Signed by:  Willette AlmaNikki Pickenpack-Cousar, AGPCNP-BC Palliative Medicine Team  Phone: [phone] Fax: [phone] Pager: [phone] Amion: Thea AlkenN. Cousar

## 2019-06-10 NOTE — Progress Notes (Signed)
PROGRESS NOTE    Leonard Montoya  TIR:[id number] DOB: 06-Jan-1941 DOA: 06/07/2019 PCP: Donita Brooks, MD   Brief Narrative:  78 year old male with prior history of atrial fibrillation not on anticoagulation, history of CVA, chronic combined systolic and diastolic heart failure was brought in from home for increased confusion.  Patient was found to have Enterococcus urinary tract infection and community-acquired pneumonia.  Neurology consulted and MRI of the brain without contrast ordered. Meanwhile pt continues to deteriorate, not responding, became hypotensive, went to atrial fif with RVR.  Palliative care consulted and the patient's daughter did not want any aggressive management, reported that he has been bed bound for 3 years, has not been taking any thing orally for many weeks now and wanted to transition to comfort care and residential hospice placement.   Assessment & Plan:   Active Problems:   Essential hypertension, benign   Asthma, chronic   Atrial fibrillation with rapid ventricular response (HCC)   Community acquired pneumonia   Pressure ulcer   UTI (urinary tract infection)   CVA (cerebral vascular accident) (HCC)   Acute encephalopathy   Chronic combined systolic and diastolic CHF (congestive heart failure) (HCC)   Altered mental status  Acute metabolic/toxic encephalopathy Probably secondary to urinary tract infection/pneumonia versus seizures vs hypernatremia.  Neurology consulted  Started the patient on Keppra 250 mg BID and ativan PRN. Lactic acid level within normal limits. Pro calcitonin is negative.  CT angiogram of the head shows no acute intracranial hemorrhage or evidence of evolving recent infarction. No proximal intracranial vessel occlusion. Posterior circulation atherosclerosis. Severe left MCA stenosis identified on prior MRA is not seen. 1-2 mm inferiorly directed outpouching from the distal supraclinoid left ICA reflecting a small aneurysm or  infundibulum.  No further work up at this time as per family we have transitioned him to comfort measures.   Hypernatremia:  Possibly free water deficit from poor/no oral intake. Comfort feeds.    Urinary tract infection Urine cultures growing Citrobacter and Enterococcus. IV antibiotics discontinued as per family request.    Multifocal pneumonia with acute respiratory failure with hypoxia sec to multifocal pneumonia.  Antibiotics discontinued as per family request.  [place] oxygen as needed for comfort.  Morphine as needed for comfort.   Persistent atrial fibrillation with RVR IV metoprolol 5 mg every 6 hours ordered. Pt not on anticoagulation at home. It was discontinued by his PCP   Chronic combined systolic and diastolic heart failure.  Pt appears compensated. Pt on lasix at home whiich is on hold for now.    H/o CVA:  CT angio of the HEAD ruled out large vessel CVA in view of his h/o of atrial fib and not on anticoagulation.      Sacral pressure ulcer.  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 (Active)  06/08/19 0245  Location: Sacrum  Location Orientation: Medial  Staging: Stage IV - Full thickness tissue loss with exposed bone, tendon or muscle.  Wound Description (Comments):   Present on Admission: Yes    Patient is followed by palliative care at home, in view of poor functional status, bed bound status, clinical deterioration, poor prognosis  request ed palliative care consult for goals of care discussion . Patient's daughter transitioned the patient care to comfort care.  csw requested for residential hospice placement.    Pressure Injury 06/08/19 Sacrum Medial Stage IV - Full thickness tissue loss with exposed bone, tendon  or muscle. (Active)  06/08/19 0245  Location: Sacrum  Location Orientation: Medial  Staging: Stage IV - Full thickness tissue loss with exposed bone, tendon or muscle.   Wound Description (Comments):   Present on Admission: Yes        DVT prophylaxis: lovenox Code Status: DNR Family Communication: spoke to daughter over the phone.  Disposition Plan:pending residential hospice placement.    Consultants:   Neurology.   Palliative care.    Procedures: EEG   Antimicrobials:discontinued antibiotics   Subjective: Not responding to stimuli  Objective: Vitals:   06/10/19 0100 06/10/19 0341 06/10/19 0745 06/10/19 0806  BP:  108/69  (!) 100/56  Pulse:  (!) 123  (!) 42  Resp: (!) 23 20  (!) 36  Temp:  98.5 F (36.9 C) 98.6 F (37 C)   TempSrc:  Oral Axillary   SpO2:  100%  99%  Weight:  131.2 kg      Intake/Output Summary (Last 24 hours) at 06/10/2019 0905 Last data filed at 06/10/2019 0500 Gross per 24 hour  Intake 497.8 ml  Output 400 ml  Net 97.8 ml   Filed Weights   06/09/19 0340 06/10/19 0341  Weight: 133.9 kg 131.2 kg    Examination:  General exam: mild distress from sob.  Respiratory system: scattered rhonchi, tachypnea present, diminished air entry .  Cardiovascular system: s1s2, irregularly irregular, tachycardic.  Gastrointestinal system: Abdomen is soft  ND  Central nervous system: lethargic not following any commands.  Extremities: bilateral lower extremity 2+ edema, appears to be chronic.  Skin: stage 4 sacral decubitus ulcer.  Psychiatry: couldn't be assessed.    Data Reviewed: I have personally reviewed following labs and imaging studies  CBC: Recent Labs  Lab 06/07/19 1520 06/08/19 0913 06/10/19 0737  WBC 10.5 11.3* 13.8*  NEUTROABS 8.4* 8.7*  --   HGB 17.4* 15.9 15.2  HCT 55.8* 51.0 50.8  MCV 99.6 101.6* 104.7*  PLT 192 185 122*   Basic Metabolic Panel: Recent Labs  Lab 06/07/19 1819 06/08/19 0913 06/09/19 1632 06/10/19 0737  NA 145 147* 146* 147*  K 4.6 4.0 4.3 4.3  CL 102 106 108 108  CO2 GLUCOSE 112* 105* 121* 98  BUN 24* 20 25* 27*  CREATININE 0.50* 0.67 0.56*  0.76  CALCIUM 8.9 9.3 9.0 9.1   GFR: CrCl cannot be calculated (Unknown ideal weight.). Liver Function Tests: Recent Labs  Lab 06/07/19 1819  AST 29  ALT 17  ALKPHOS 78  BILITOT 1.1  PROT 5.7*  ALBUMIN 2.6*   No results for input(s): LIPASE, AMYLASE in the last 168 hours. No results for input(s): AMMONIA in the last 168 hours. Coagulation Profile: No results for input(s): INR, PROTIME in the last 168 hours. Cardiac Enzymes: No results for input(s): CKTOTAL, CKMB, CKMBINDEX, TROPONINI in the last 168 hours. BNP (last 3 results) No results for input(s): PROBNP in the last 8760 hours. HbA1C: No results for input(s): HGBA1C in the last 72 hours. CBG: Recent Labs  Lab 06/07/19 1505  GLUCAP 96   Lipid Profile: No results for input(s): CHOL, HDL, LDLCALC, TRIG, CHOLHDL, LDLDIRECT in the last 72 hours. Thyroid Function Tests: No results for input(s): TSH, T4TOTAL, FREET4, T3FREE, THYROIDAB in the last 72 hours. Anemia Panel: No results for input(s): VITAMINB12, FOLATE, FERRITIN, TIBC, IRON, RETICCTPCT in the last 72 hours. Sepsis Labs: Recent Labs  Lab 06/07/19 1645 06/08/19 0218 06/08/19 0913 06/09/19 0904 06/09/19 1632  PROCALCITON  --   --  <  0.10 <0.10  --   LATICACIDVEN 2.2* 1.7 3.9*  --  1.2    Recent Results (from the past 240 hour(s))  Blood culture (routine x 2)     Status: None (Preliminary result)   Collection Time: 06/07/19  3:26 PM   Specimen: BLOOD RIGHT ARM  Result Value Ref Range Status   Specimen Description BLOOD RIGHT ARM  Final   Special Requests   Final    BOTTLES DRAWN AEROBIC AND ANAEROBIC Blood Culture adequate volume   Culture   Final    NO GROWTH 2 DAYS Performed at [facility], [address] [place], [place] [address]    Report Status PENDING  Incomplete  Blood culture (routine x 2)     Status: None (Preliminary result)   Collection Time: 06/07/19  4:15 PM   Specimen: BLOOD RIGHT HAND  Result Value Ref Range Status    Specimen Description BLOOD RIGHT HAND  Final   Special Requests   Final    BOTTLES DRAWN AEROBIC ONLY Blood Culture results may not be optimal due to an inadequate volume of blood received in culture bottles   Culture   Final    NO GROWTH 2 DAYS Performed at [facility], [address] [place], [place] [address]    Report Status PENDING  Incomplete  Urine culture     Status: Abnormal   Collection Time: 06/07/19  7:53 PM   Specimen: Urine, Catheterized  Result Value Ref Range Status   Specimen Description URINE, CATHETERIZED  Final   Special Requests   Final    NONE Performed at [facility], [address] [place], [place] [address]    Culture (A)  Final    >=100,000 COLONIES/mL CITROBACTER AMALONATICUS 60,000 COLONIES/mL ENTEROCOCCUS FAECALIS    Report Status 06/09/2019 FINAL  Final   Organism ID, Bacteria CITROBACTER AMALONATICUS (A)  Final   Organism ID, Bacteria ENTEROCOCCUS FAECALIS (A)  Final      Susceptibility   Citrobacter amalonaticus - MIC*    CEFAZOLIN >=64 RESISTANT Resistant     CEFTRIAXONE <=1 SENSITIVE Sensitive     CIPROFLOXACIN <=0.25 SENSITIVE Sensitive     GENTAMICIN <=1 SENSITIVE Sensitive     IMIPENEM <=0.25 SENSITIVE Sensitive     NITROFURANTOIN 128 RESISTANT Resistant     TRIMETH/SULFA <=20 SENSITIVE Sensitive     PIP/TAZO <=4 SENSITIVE Sensitive     * >=100,000 COLONIES/mL CITROBACTER AMALONATICUS   Enterococcus faecalis - MIC*    AMPICILLIN <=2 SENSITIVE Sensitive     LEVOFLOXACIN 1 SENSITIVE Sensitive     NITROFURANTOIN <=16 SENSITIVE Sensitive     VANCOMYCIN 2 SENSITIVE Sensitive     * 60,000 COLONIES/mL ENTEROCOCCUS FAECALIS  SARS CORONAVIRUS 2 (TAT 6-24 HRS)     Status: None   Collection Time: 06/07/19  8:58 PM  Result Value Ref Range Status   SARS Coronavirus 2 NEGATIVE NEGATIVE Final    Comment: (NOTE) SARS-CoV-2 target nucleic acids are NOT DETECTED. The SARS-CoV-2 RNA is generally detectable in upper and lower  respiratory specimens during the acute phase of infection. Negative results do not preclude SARS-CoV-2 infection, do not rule out co-infections with other pathogens, and should not be used as the sole basis for treatment or other patient management decisions. Negative results must be combined with clinical observations, patient history, and epidemiological information. The expected result is Negative. Fact Sheet for Patients: HairSlick.nohttps://www.fda.gov/media/138098/download Fact Sheet for Healthcare Providers: quierodirigir.comhttps://www.fda.gov/media/138095/download This test is not yet approved or  cleared by the [place] and  has been authorized for detection and/or diagnosis of SARS-CoV-2 by FDA under an Emergency Use Authorization (EUA). This EUA will remain  in effect (meaning this test can be used) for the duration of the COVID-19 declaration under Section 56 4(b)(1) of the Act, 21 U.S.C. section 360bbb-3(b)(1), unless the authorization is terminated or revoked sooner. Performed at [facility], [place] [address], [place], [place] [address]          Radiology Studies: Ct Angio Head W Or Wo Contrast  Result Date: 06/09/2019 CLINICAL DATA:  Altered mental status, history of stroke, atrial fibrillation EXAM: CT ANGIOGRAPHY HEAD TECHNIQUE: Multidetector CT imaging of the head was performed using the standard protocol during bolus administration of intravenous contrast. Multiplanar CT image reconstructions and MIPs were obtained to evaluate the vascular anatomy. CONTRAST:  78mL OMNIPAQUE IOHEXOL 350 MG/ML SOLN COMPARISON:  MRA 2018, CT head 06/07/2019 FINDINGS: CT HEAD Brain: There is no acute intracranial hemorrhage, mass effect, or edema. There is no new loss of gray-white differentiation. Chronic left PCA territory infarction again identified with associated ex vacuo dilatation of the adjacent left lateral ventricle. Chronic small vessel infarcts of the left lentiform nucleus and right  cerebellum are also again seen. Additional patchy hypoattenuation in the supratentorial white matter likely reflects stable chronic microvascular ischemic changes. Prominence of the ventricles and sulci reflects stable parenchymal volume loss. Vascular: There is mild intracranial atherosclerotic calcification at the skull base. Skull: Unremarkable. Sinuses: Aerated. Orbits: Unremarkable. CTA HEAD Anterior circulation: Intracranial internal carotid arteries are patent with mild calcified plaque. There is a 1-2 mm inferiorly directed outpouching from the distal supraclinoid left ICA. Anterior and middle cerebral arteries are patent. The short segment of left MCA severe stenosis seen on prior MRA is not present on this study. Posterior circulation: Intracranial vertebral arteries, basilar artery, and posterior cerebral arteries are patent. There is atherosclerotic irregularity of the posterior cerebral arteries and new focal moderate stenosis of the right P2 PCA. Venous sinuses: As permitted by contrast timing, patent. IMPRESSION: No acute intracranial hemorrhage or evidence of evolving recent infarction. Stable chronic findings detailed above. No proximal intracranial vessel occlusion. Posterior circulation atherosclerosis. Severe left MCA stenosis identified on prior MRA is not seen. 1-2 mm inferiorly directed outpouching from the distal supraclinoid left ICA reflecting a small aneurysm or infundibulum. Electronically Signed   By: Macy Mis M.D.   On: 06/09/2019 10:46        Scheduled Meds: . enoxaparin (LOVENOX) injection  40 mg Subcutaneous Q24H  . gabapentin  600 mg Oral QHS  . hydrocerin   Topical BID  . metoprolol tartrate  5 mg Intravenous Q6H  . QUEtiapine  100 mg Oral QHS  . sodium chloride flush  3 mL Intravenous Q12H  . sodium chloride flush  3 mL Intravenous Q12H   Continuous Infusions: . sodium chloride 10 mL/hr at 06/10/19 0500  . diltiazem (CARDIZEM) infusion    . levETIRAcetam  Stopped (06/10/19 0243)  . sodium chloride       LOS: 3 days      Hosie Poisson, MD [facility] 06/10/2019, 9:05 AM

## 2019-06-10 NOTE — Procedures (Signed)
Patient Name: Leonard Montoya  MRN: [id number]  Epilepsy Attending: Lora Havens  Referring Physician/Provider: Dr. Zeb Comfort Duration:  06/09/2019 1108 to 05/31/2019 1141  Patient history: 78 year old male with left PCA stroke.  EEG to evaluate for seizures.  Level of alertness: Awake, asleep  AEDs during EEG study:  Keppra  Technical aspects: This EEG study was done with scalp electrodes positioned according to the 10-20 International system of electrode placement. Electrical activity was acquired at a sampling rate of 500Hz  and reviewed with a high frequency filter of 70Hz  and a low frequency filter of 1Hz . EEG data were recorded continuously and digitally stored.   Description: During awake state, no clear posterior dominant rhythm was seen.    Sleep was characterized by vertex waves, diffuse 3 to 5 Hz theta-delta slowing. EEG also showed continuous generalized 3 to 5 Hz theta-delta slowing, maximal left parieto-occipital.  Frequent sharp waves were seen in left parieto-occipital region which at times appeared rhythmic without clear evolution and no clinical change.  IV Ativan 2 mg was administered without any significant change in the rhythmic activity.  Hyperventilation and photic stimulation were not performed.  Event button was pressed on 06/10/2019 at 6:16 AM.  Per nursing documentation, patient was noted to slightly raise his left hand and was shaking as well as grunting as if in pain.  Concomitant EEG before during and after the event did not show any EEG change to suggest seizure.  Abnormality -Sharp waves, left parieto-occipital -Continuous slow, generalized and maximal left parieto-occipital  IMPRESSION: This study showed evidence of potential epileptogenicity and cortical dysfunction in left parieto-occipital region.  Additionally there is evidence of moderate to severe diffuse encephalopathy, nonspecific etiology. No definite seizures were seen throughout the  recording.  Event button was placed on 05/31/2019 at 616 as described above without EEG correlate.  However, focal motor seizures are sometimes not seen on scalp EEG and therefore clinical correlation is recommended.  [name] [name]

## 2019-06-11 MED ORDER — LORAZEPAM 2 MG/ML IJ SOLN: 2.0000 mg | INTRAMUSCULAR | 0 refills | Status: AC | PRN

## 2019-06-11 MED ORDER — SODIUM CHLORIDE 0.9 % IV SOLN: 250.0000 mg | Freq: Two times a day (BID) | INTRAVENOUS | Status: AC

## 2019-06-11 MED ORDER — GLYCOPYRROLATE 0.2 MG/ML IJ SOLN: 0.3000 mg | INTRAMUSCULAR | Status: AC | PRN

## 2019-06-11 NOTE — TOC Transition Note (Signed)
Transition of Care [facility]) - CM/SW Discharge Note   Patient Details  Name: Leonard Montoya MRN: [id number] Date of Birth: 08-Oct-1940  Transition of Care [facility]) CM/SW Contact:  Geralynn Ochs, LCSW Phone Number: 06/11/2019, 12:02 PM   Clinical Narrative:   Nurse to call report to [phone].    Final next level of care: [place] Barriers to Discharge: Barriers Resolved   Patient Goals and CMS Choice Patient states their goals for this hospitalization and ongoing recovery are:: patient unable to state goals due to medical condition, family wants him to be comfortable CMS Medicare.gov Compare Post Acute Care list provided to:: Patient Represenative (must comment) Choice offered to / list presented to : Adult Children  Discharge Placement                Patient to be transferred to facility by: [facility] Name of family member notified: Sandy Patient and family notified of of transfer: 06/11/19  Discharge Plan and Services     Post Acute Care Choice: Hospice                               Social Determinants of Health (SDOH) Interventions     Readmission Risk Interventions No flowsheet data found.

## 2019-06-11 NOTE — Progress Notes (Signed)
[employer] available for Mr. Galdamez today. Paper work completed with daughter [name] via [employer]. Discharge summary has been routed to [facility]. [facility] manager Kathlee Nations aware.   RN Please call report to [phone].  Thank you,  Erling Conte, LCSW [phone]

## 2019-06-11 NOTE — Discharge Summary (Signed)
Physician Discharge Summary  Leonard Montoya JXB:[id number] DOB: 30-Apr-1941 DOA: 06/07/2019  PCP: Donita Brooks, MD  Admit date: 06/07/2019 Discharge date: 06/11/2019  Admitted From: Home.  Disposition:  Residential hospice.   Recommendations for Outpatient Follow-up:  Follow up with hospice MD   Discharge Condition:hospice.  CODE STATUS:comfort care.  Diet recommendation: comfort feeds.   Brief/Interim Summary: 78 year old male with prior history of atrial fibrillation not on anticoagulation, history of CVA, chronic combined systolic and diastolic heart failure was brought in from home for increased confusion.  Patient was found to have Enterococcus urinary tract infection and community-acquired pneumonia.  Neurology consulted and MRI of the brain without contrast ordered. Meanwhile pt continues to deteriorate, not responding, became hypotensive, went to atrial fif with RVR.  Palliative care consulted and the patient's daughter did not want any aggressive management, reported that he has been bed bound for 3 years, has not been taking any thing orally for many weeks now and wanted to transition to comfort care and residential hospice placement.   Discharge Diagnoses:  Active Problems:   Essential hypertension, benign   Asthma, chronic   Atrial fibrillation with rapid ventricular response (HCC)   Community acquired pneumonia   Pressure ulcer   UTI (urinary tract infection)   CVA (cerebral vascular accident) (HCC)   Acute encephalopathy   Chronic combined systolic and diastolic CHF (congestive heart failure) (HCC)   Altered mental status  Acute metabolic/toxic encephalopathy Probably secondary to urinary tract infection/pneumonia versus seizures vs hypernatremia.  Neurology consulted  Started the patient on Keppra 250 mg BID and ativan PRN. Lactic acid level within normal limits. Pro calcitonin is negative.  CT angiogram of the head shows no acute intracranial hemorrhage or  evidence of evolving recent infarction. No proximal intracranial vessel occlusion. Posterior circulation atherosclerosis. Severe left MCA stenosis identified on prior MRA is not seen. 1-2 mm inferiorly directed outpouching from the distal supraclinoid left ICA reflecting a small aneurysm or infundibulum.  No further work up at this time as per family we have transitioned him to comfort measures.   Hypernatremia:  Possibly free water deficit from poor/no oral intake. Comfort feeds.    Urinary tract infection Urine cultures growing Citrobacter and Enterococcus. IV antibiotics discontinued as per family request.    Multifocal pneumonia with acute respiratory failure with hypoxia sec to multifocal pneumonia.  Antibiotics discontinued as per family request.  [place] oxygen as needed for comfort.  Morphine as needed for comfort.   Persistent atrial fibrillation with RVR Resolved.  Pt not on anticoagulation at home. It was discontinued by his PCP   Chronic combined systolic and diastolic heart failure.     H/o CVA:  CT angio of the HEAD ruled out large vessel CVA in view of his h/o of atrial fib and not on anticoagulation.      Sacral pressure ulcer.  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 (Active)  06/08/19 0245  Location: Sacrum  Location Orientation: Medial  Staging: Stage IV - Full thickness tissue loss with exposed bone, tendon or muscle.  Wound Description (Comments):   Present on Admission: Yes    Patient is followed by palliative care at home, in view of poor functional status, bed bound status, clinical deterioration, poor prognosis  request ed palliative care consult for goals of care discussion . Patient's daughter transitioned the patient care to comfort care.  csw requested for residential hospice placement.  Discharge Instructions  Discharge Instructions    Discharge  instructions   Complete by: As directed    Please follow up with hospice MD as needed.     Allergies as of 06/11/2019      Reactions   Sudafed [pseudoephedrine Hcl] Other (See Comments)   Weird Dreams      Medication List    STOP taking these medications   furosemide 40 MG tablet Commonly known as: LASIX   gabapentin 300 MG capsule Commonly known as: NEURONTIN   meloxicam 7.5 MG tablet Commonly known as: MOBIC   QUEtiapine 50 MG tablet Commonly known as: SEROquel     TAKE these medications   glycopyrrolate 0.2 MG/ML injection Commonly known as: ROBINUL Inject 1.5 mLs (0.3 mg total) into the vein every 4 (four) hours as needed (excessive secretions).   levETIRAcetam 250 mg in sodium chloride 0.9 % 100 mL Inject 250 mg into the vein every 12 (twelve) hours.   LORazepam 2 MG/ML injection Commonly known as: ATIVAN Inject 1 mL (2 mg total) into the vein as needed for seizure (Generalized tonic-clonic seizure lasting more than 2 minutes or focal seizure lasting more than 5 minutes.  Please notify neurology if administered.).       Allergies  Allergen Reactions  . Sudafed [Pseudoephedrine Hcl] Other (See Comments)    Weird Dreams    Consultations:  Neurology  Palliative care.    Procedures/Studies: Ct Angio Head W Or Wo Contrast  Result Date: 06/09/2019 CLINICAL DATA:  Altered mental status, history of stroke, atrial fibrillation EXAM: CT ANGIOGRAPHY HEAD TECHNIQUE: Multidetector CT imaging of the head was performed using the standard protocol during bolus administration of intravenous contrast. Multiplanar CT image reconstructions and MIPs were obtained to evaluate the vascular anatomy. CONTRAST:  40mL OMNIPAQUE IOHEXOL 350 MG/ML SOLN COMPARISON:  MRA 2018, CT head 06/07/2019 FINDINGS: CT HEAD Brain: There is no acute intracranial hemorrhage, mass effect, or edema. There is no new loss of gray-white differentiation. Chronic left PCA territory infarction again  identified with associated ex vacuo dilatation of the adjacent left lateral ventricle. Chronic small vessel infarcts of the left lentiform nucleus and right cerebellum are also again seen. Additional patchy hypoattenuation in the supratentorial white matter likely reflects stable chronic microvascular ischemic changes. Prominence of the ventricles and sulci reflects stable parenchymal volume loss. Vascular: There is mild intracranial atherosclerotic calcification at the skull base. Skull: Unremarkable. Sinuses: Aerated. Orbits: Unremarkable. CTA HEAD Anterior circulation: Intracranial internal carotid arteries are patent with mild calcified plaque. There is a 1-2 mm inferiorly directed outpouching from the distal supraclinoid left ICA. Anterior and middle cerebral arteries are patent. The short segment of left MCA severe stenosis seen on prior MRA is not present on this study. Posterior circulation: Intracranial vertebral arteries, basilar artery, and posterior cerebral arteries are patent. There is atherosclerotic irregularity of the posterior cerebral arteries and new focal moderate stenosis of the right P2 PCA. Venous sinuses: As permitted by contrast timing, patent. IMPRESSION: No acute intracranial hemorrhage or evidence of evolving recent infarction. Stable chronic findings detailed above. No proximal intracranial vessel occlusion. Posterior circulation atherosclerosis. Severe left MCA stenosis identified on prior MRA is not seen. 1-2 mm inferiorly directed outpouching from the distal supraclinoid left ICA reflecting a small aneurysm or infundibulum. Electronically Signed   By: Guadlupe Spanish M.D.   On: 06/09/2019 10:46   Ct Head Wo Contrast  Result Date: 06/07/2019 CLINICAL DATA:  Altered level of consciousness EXAM: CT HEAD WITHOUT CONTRAST TECHNIQUE:  Contiguous axial images were obtained from the base of the skull through the vertex without intravenous contrast. COMPARISON:  CT 01/30/2017 FINDINGS:  Brain: Stable gliotic changes related to prior left PCA territory infarction with asymmetric enlargement of the left lateral ventricle, similar to comparison exam. There is a region of gliosis in the superior right cerebellar hemisphere which is also new from comparison but has an overall chronic appearance. Additional lacunar type infarcts are noted in the left basal ganglia, similar to comparison. No evidence of acute infarction, hemorrhage, hydrocephalus, extra-axial collection or mass lesion/mass effect. Symmetric prominence of the ventricles, cisterns and sulci compatible with parenchymal volume loss. Patchy areas of white matter hypoattenuation are most compatible with chronic microvascular angiopathy. Vascular: Atherosclerotic calcification of the carotid siphons and intradural vertebral arteries. No hyperdense vessel. Skull: No calvarial fracture or suspicious osseous lesion. No scalp swelling or hematoma. Sinuses/Orbits: Paranasal sinuses and mastoid air cells are predominantly clear. Small amount of debris in the right external auditory canal. Included orbital structures are unremarkable. Other: None IMPRESSION: Region of gliosis in the right cerebellar hemisphere is new since 2018 but has a more chronic appearance. No other new or acute intracranial abnormality is seen. Stable gliotic changes related to prior left PCA territory infarction and chronic lacunar type infarcts in the left basal ganglia. Moderate parenchymal volume loss and chronic microvascular angiopathy changes are similar to prior. Electronically Signed   By: Kreg Shropshire M.D.   On: 06/07/2019 16:20   Dg Chest Port 1 View  Result Date: 06/07/2019 CLINICAL DATA:  Altered, confusion and slurred speech for 3 weeks, history of recent UTI EXAM: PORTABLE CHEST 1 VIEW COMPARISON:  Radiograph Nov 23, 2017 FINDINGS: There is bilateral pleural thickening. Portion of the right costophrenic sulcus is collimated from view. No consolidative airspace  opacity is seen. Cardiomegaly and prominence of the right heart border is similar to prior likely reflecting the abundant pericardial fat seen on prior CT. Some patchy opacity is noted in the right lower lung and left mid lung. Tracheal tortuosity, similar to prior. Degenerative changes are present in the imaged spine and shoulders. IMPRESSION: 1. Bilateral pleural thickening, could reflect chronic effusions seen on comparison studies. 2. Patchy opacity in the right lower lung and left mid lung, suspicious for pneumonia. 3. Unchanged cardiomegaly. Electronically Signed   By: Kreg Shropshire M.D.   On: 06/07/2019 16:41      Subjective: Unresponsive.   Discharge Exam: Vitals:   06/10/19 1935 06/11/19 0910  BP: 115/61 (!) (P) 89/42  Pulse: 70 (P) 79  Resp: (!) 22 (!) (P) 22  Temp: 98.3 F (36.8 C) (P) 98.9 F (37.2 C)  SpO2: 98% (P) 99%   Vitals:   06/10/19 0900 06/10/19 0932 06/10/19 1935 06/11/19 0910  BP:  108/69 115/61 (!) (P) 89/42  Pulse:  86 70 (P) 79  Resp:  (!) 27 (!) 22 (!) (P) 22  Temp:  98.3 F (36.8 C) 98.3 F (36.8 C) (P) 98.9 F (37.2 C)  TempSrc:  Axillary Oral (P) Axillary  SpO2:  95% 98% (P) 99%  Weight:      Height:  (1.854 m)       General: Pt is obtunded.  Cardiovascular: irregular, S1S2,  Respiratory: BILATERAL RHONCHI.  Abdominal: Soft, bowel sounds + Extremities: edema present.     The results of significant diagnostics from this hospitalization (including imaging, microbiology, ancillary and laboratory) are listed below for reference.     Microbiology: Recent Results (from the past  240 hour(s))  Blood culture (routine x 2)     Status: None (Preliminary result)   Collection Time: 06/07/19  3:26 PM   Specimen: BLOOD RIGHT ARM  Result Value Ref Range Status   Specimen Description BLOOD RIGHT ARM  Final   Special Requests   Final    BOTTLES DRAWN AEROBIC AND ANAEROBIC Blood Culture adequate volume   Culture   Final    NO GROWTH 3  DAYS Performed at [facility], [address] [place], [place] [address]    Report Status PENDING  Incomplete  Blood culture (routine x 2)     Status: None (Preliminary result)   Collection Time: 06/07/19  4:15 PM   Specimen: BLOOD RIGHT HAND  Result Value Ref Range Status   Specimen Description BLOOD RIGHT HAND  Final   Special Requests   Final    BOTTLES DRAWN AEROBIC ONLY Blood Culture results may not be optimal due to an inadequate volume of blood received in culture bottles   Culture   Final    NO GROWTH 3 DAYS Performed at [facility], [address] [place], [place] [address]    Report Status PENDING  Incomplete  Urine culture     Status: Abnormal   Collection Time: 06/07/19  7:53 PM   Specimen: Urine, Catheterized  Result Value Ref Range Status   Specimen Description URINE, CATHETERIZED  Final   Special Requests   Final    NONE Performed at [facility], [address] [place], [place] [address]    Culture (A)  Final    >=100,000 COLONIES/mL CITROBACTER AMALONATICUS 60,000 COLONIES/mL ENTEROCOCCUS FAECALIS    Report Status 06/09/2019 FINAL  Final   Organism ID, Bacteria CITROBACTER AMALONATICUS (A)  Final   Organism ID, Bacteria ENTEROCOCCUS FAECALIS (A)  Final      Susceptibility   Citrobacter amalonaticus - MIC*    CEFAZOLIN >=64 RESISTANT Resistant     CEFTRIAXONE <=1 SENSITIVE Sensitive     CIPROFLOXACIN <=0.25 SENSITIVE Sensitive     GENTAMICIN <=1 SENSITIVE Sensitive     IMIPENEM <=0.25 SENSITIVE Sensitive     NITROFURANTOIN 128 RESISTANT Resistant     TRIMETH/SULFA <=20 SENSITIVE Sensitive     PIP/TAZO <=4 SENSITIVE Sensitive     * >=100,000 COLONIES/mL CITROBACTER AMALONATICUS   Enterococcus faecalis - MIC*    AMPICILLIN <=2 SENSITIVE Sensitive     LEVOFLOXACIN 1 SENSITIVE Sensitive     NITROFURANTOIN <=16 SENSITIVE Sensitive     VANCOMYCIN 2 SENSITIVE Sensitive     * 60,000 COLONIES/mL ENTEROCOCCUS FAECALIS  SARS CORONAVIRUS 2  (TAT 6-24 HRS)     Status: None   Collection Time: 06/07/19  8:58 PM  Result Value Ref Range Status   SARS Coronavirus 2 NEGATIVE NEGATIVE Final    Comment: (NOTE) SARS-CoV-2 target nucleic acids are NOT DETECTED. The SARS-CoV-2 RNA is generally detectable in upper and lower respiratory specimens during the acute phase of infection. Negative results do not preclude SARS-CoV-2 infection, do not rule out co-infections with other pathogens, and should not be used as the sole basis for treatment or other patient management decisions. Negative results must be combined with clinical observations, patient history, and epidemiological information. The expected result is Negative. Fact Sheet for Patients: HairSlick.no Fact Sheet for Healthcare Providers: quierodirigir.com This test is not yet approved or cleared by the [place] FDA and  has been authorized for detection and/or diagnosis of SARS-CoV-2 by FDA under an Emergency  Use Authorization (EUA). This EUA will remain  in effect (meaning this test can be used) for the duration of the COVID-19 declaration under Section 56 4(b)(1) of the Act, 21 U.S.C. section 360bbb-3(b)(1), unless the authorization is terminated or revoked sooner. Performed at [facility], [address] [place], [place] [address]      Labs: BNP (last 3 results) No results for input(s): BNP in the last 8760 hours. Basic Metabolic Panel: Recent Labs  Lab 06/07/19 1819 06/08/19 0913 06/09/19 1632 06/10/19 0737  NA 145 147* 146* 147*  K 4.6 4.0 4.3 4.3  CL 102 106 108 108  CO2 31 30 26 28   GLUCOSE 112* 105* 121* 98  BUN 24* 20 25* 27*  CREATININE 0.50* 0.67 0.56* 0.76  CALCIUM 8.9 9.3 9.0 9.1   Liver Function Tests: Recent Labs  Lab 06/07/19 1819  AST 29  ALT 17  ALKPHOS 78  BILITOT 1.1  PROT 5.7*  ALBUMIN 2.6*   No results for input(s): LIPASE, AMYLASE in the last 168 hours. No  results for input(s): AMMONIA in the last 168 hours. CBC: Recent Labs  Lab 06/07/19 1520 06/08/19 0913 06/10/19 0737  WBC 10.5 11.3* 13.8*  NEUTROABS 8.4* 8.7*  --   HGB 17.4* 15.9 15.2  HCT 55.8* 51.0 50.8  MCV 99.6 101.6* 104.7*  PLT 192 185 122*   Cardiac Enzymes: No results for input(s): CKTOTAL, CKMB, CKMBINDEX, TROPONINI in the last 168 hours. BNP: Invalid input(s): POCBNP CBG: Recent Labs  Lab 06/07/19 1505  GLUCAP 96   D-Dimer No results for input(s): DDIMER in the last 72 hours. Hgb A1c No results for input(s): HGBA1C in the last 72 hours. Lipid Profile No results for input(s): CHOL, HDL, LDLCALC, TRIG, CHOLHDL, LDLDIRECT in the last 72 hours. Thyroid function studies No results for input(s): TSH, T4TOTAL, T3FREE, THYROIDAB in the last 72 hours.  Invalid input(s): FREET3 Anemia work up No results for input(s): VITAMINB12, FOLATE, FERRITIN, TIBC, IRON, RETICCTPCT in the last 72 hours. Urinalysis    Component Value Date/Time   COLORURINE YELLOW 06/07/2019 1953   APPEARANCEUR CLOUDY (A) 06/07/2019 1953   LABSPEC 1.018 06/07/2019 1953   PHURINE 5.0 06/07/2019 1953   GLUCOSEU NEGATIVE 06/07/2019 1953   HGBUR LARGE (A) 06/07/2019 1953   BILIRUBINUR NEGATIVE 06/07/2019 1953   KETONESUR NEGATIVE 06/07/2019 1953   PROTEINUR 100 (A) 06/07/2019 1953   NITRITE POSITIVE (A) 06/07/2019 1953   LEUKOCYTESUR LARGE (A) 06/07/2019 1953   Sepsis Labs Invalid input(s): PROCALCITONIN,  WBC,  LACTICIDVEN Microbiology Recent Results (from the past 240 hour(s))  Blood culture (routine x 2)     Status: None (Preliminary result)   Collection Time: 06/07/19  3:26 PM   Specimen: BLOOD RIGHT ARM  Result Value Ref Range Status   Specimen Description BLOOD RIGHT ARM  Final   Special Requests   Final    BOTTLES DRAWN AEROBIC AND ANAEROBIC Blood Culture adequate volume   Culture   Final    NO GROWTH 3 DAYS Performed at [facility], [address] [place], [place]  [id number]    Report Status PENDING  Incomplete  Blood culture (routine x 2)     Status: None (Preliminary result)   Collection Time: 06/07/19  4:15 PM   Specimen: BLOOD RIGHT HAND  Result Value Ref Range Status   Specimen Description BLOOD RIGHT HAND  Final   Special Requests   Final    BOTTLES DRAWN AEROBIC ONLY Blood Culture results may  not be optimal due to an inadequate volume of blood received in culture bottles   Culture   Final    NO GROWTH 3 DAYS Performed at [facility], [place] [address], [place], [place] [address]    Report Status PENDING  Incomplete  Urine culture     Status: Abnormal   Collection Time: 06/07/19  7:53 PM   Specimen: Urine, Catheterized  Result Value Ref Range Status   Specimen Description URINE, CATHETERIZED  Final   Special Requests   Final    NONE Performed at [facility], [address] [place], [place] [address]    Culture (A)  Final    >=100,000 COLONIES/mL CITROBACTER AMALONATICUS 60,000 COLONIES/mL ENTEROCOCCUS FAECALIS    Report Status 06/09/2019 FINAL  Final   Organism ID, Bacteria CITROBACTER AMALONATICUS (A)  Final   Organism ID, Bacteria ENTEROCOCCUS FAECALIS (A)  Final      Susceptibility   Citrobacter amalonaticus - MIC*    CEFAZOLIN >=64 RESISTANT Resistant     CEFTRIAXONE <=1 SENSITIVE Sensitive     CIPROFLOXACIN <=0.25 SENSITIVE Sensitive     GENTAMICIN <=1 SENSITIVE Sensitive     IMIPENEM <=0.25 SENSITIVE Sensitive     NITROFURANTOIN 128 RESISTANT Resistant     TRIMETH/SULFA <=20 SENSITIVE Sensitive     PIP/TAZO <=4 SENSITIVE Sensitive     * >=100,000 COLONIES/mL CITROBACTER AMALONATICUS   Enterococcus faecalis - MIC*    AMPICILLIN <=2 SENSITIVE Sensitive     LEVOFLOXACIN 1 SENSITIVE Sensitive     NITROFURANTOIN <=16 SENSITIVE Sensitive     VANCOMYCIN 2 SENSITIVE Sensitive     * 60,000 COLONIES/mL ENTEROCOCCUS FAECALIS  SARS CORONAVIRUS 2 (TAT 6-24 HRS)     Status: None   Collection Time: 06/07/19  8:58 PM   Result Value Ref Range Status   SARS Coronavirus 2 NEGATIVE NEGATIVE Final    Comment: (NOTE) SARS-CoV-2 target nucleic acids are NOT DETECTED. The SARS-CoV-2 RNA is generally detectable in upper and lower respiratory specimens during the acute phase of infection. Negative results do not preclude SARS-CoV-2 infection, do not rule out co-infections with other pathogens, and should not be used as the sole basis for treatment or other patient management decisions. Negative results must be combined with clinical observations, patient history, and epidemiological information. The expected result is Negative. Fact Sheet for Patients: SugarRoll.be Fact Sheet for Healthcare Providers: https://www.woods-mathews.com/ This test is not yet approved or cleared by the [place] FDA and  has been authorized for detection and/or diagnosis of SARS-CoV-2 by FDA under an Emergency Use Authorization (EUA). This EUA will remain  in effect (meaning this test can be used) for the duration of the COVID-19 declaration under Section 56 4(b)(1) of the Act, 21 U.S.C. section 360bbb-3(b)(1), unless the authorization is terminated or revoked sooner. Performed at [facility], [place] [address], [place], [place] [address]      Time coordinating discharge: 31  minutes  SIGNED:   Hosie Poisson, MD  [facility] 06/11/2019, 9:19 AM Pager   If 7PM-7AM, please contact night-coverage [email or web address] Password [email or web address]

## 2019-06-11 NOTE — Plan of Care (Signed)
Max assist and comfort care. Hospice transfer.

## 2019-06-12 LAB — CULTURE, BLOOD (ROUTINE X 2)
Culture: NO GROWTH
Culture: NO GROWTH
Special Requests: ADEQUATE

## 2019-06-22 DEATH — deceased
